# Patient Record
Sex: Female | Born: 1990 | Race: White | Hispanic: No | Marital: Single | Smoking: Never smoker
Health system: Southern US, Community
[De-identification: ages and names within clinical notes are randomized; demographics above are authoritative.]

## PROBLEM LIST (undated history)

## (undated) ENCOUNTER — Inpatient Hospital Stay (HOSPITAL_COMMUNITY): Payer: Self-pay

## (undated) DIAGNOSIS — F419 Anxiety disorder, unspecified: Secondary | ICD-10-CM

## (undated) DIAGNOSIS — R079 Chest pain, unspecified: Secondary | ICD-10-CM

## (undated) DIAGNOSIS — F32A Depression, unspecified: Secondary | ICD-10-CM

## (undated) DIAGNOSIS — R0600 Dyspnea, unspecified: Secondary | ICD-10-CM

## (undated) DIAGNOSIS — G43909 Migraine, unspecified, not intractable, without status migrainosus: Secondary | ICD-10-CM

## (undated) DIAGNOSIS — O139 Gestational [pregnancy-induced] hypertension without significant proteinuria, unspecified trimester: Secondary | ICD-10-CM

## (undated) DIAGNOSIS — F329 Major depressive disorder, single episode, unspecified: Secondary | ICD-10-CM

## (undated) DIAGNOSIS — T7840XA Allergy, unspecified, initial encounter: Secondary | ICD-10-CM

## (undated) DIAGNOSIS — K9041 Non-celiac gluten sensitivity: Secondary | ICD-10-CM

## (undated) DIAGNOSIS — J45909 Unspecified asthma, uncomplicated: Secondary | ICD-10-CM

## (undated) DIAGNOSIS — K589 Irritable bowel syndrome without diarrhea: Secondary | ICD-10-CM

## (undated) DIAGNOSIS — R064 Hyperventilation: Secondary | ICD-10-CM

## (undated) DIAGNOSIS — F410 Panic disorder [episodic paroxysmal anxiety] without agoraphobia: Secondary | ICD-10-CM

## (undated) HISTORY — DX: Hyperventilation: R06.4

## (undated) HISTORY — DX: Depression, unspecified: F32.A

## (undated) HISTORY — DX: Panic disorder (episodic paroxysmal anxiety): F41.0

## (undated) HISTORY — DX: Anxiety disorder, unspecified: F41.9

## (undated) HISTORY — DX: Dyspnea, unspecified: R06.00

## (undated) HISTORY — DX: Chest pain, unspecified: R07.9

## (undated) HISTORY — PX: WISDOM TOOTH EXTRACTION: SHX21

## (undated) HISTORY — PX: ADENOIDECTOMY: SUR15

## (undated) HISTORY — PX: TONSILLECTOMY: SUR1361

## (undated) HISTORY — DX: Allergy, unspecified, initial encounter: T78.40XA

## (undated) HISTORY — DX: Major depressive disorder, single episode, unspecified: F32.9

---
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
## 2009-10-06 ENCOUNTER — Ambulatory Visit: Payer: Self-pay | Admitting: Obstetrics & Gynecology

## 2009-10-06 ENCOUNTER — Encounter (INDEPENDENT_AMBULATORY_CARE_PROVIDER_SITE_OTHER): Payer: Self-pay | Admitting: *Deleted

## 2009-10-06 LAB — CONVERTED CEMR LAB: Chlamydia, DNA Probe: NEGATIVE

## 2009-11-01 ENCOUNTER — Emergency Department: Payer: Self-pay | Admitting: Emergency Medicine

## 2009-11-10 ENCOUNTER — Ambulatory Visit: Payer: Self-pay | Admitting: Obstetrics & Gynecology

## 2010-10-04 NOTE — Assessment & Plan Note (Signed)
[name], PETROVICH NO.:  [id number]   MEDICAL RECORD NO.:  [id number]          PATIENT TYPE:  POB   LOCATION:  [facility] at [facility]         FACILITY:  [facility]   PHYSICIAN:  Scheryl Darter, MD       DATE OF BIRTH:  1991-02-04   DATE OF SERVICE:  11/10/2009                                  CLINIC NOTE   HISTORY:  The patient returns today stating that she has had pain that  are originated in her back and radiated to her pelvis for about last 2-3  weeks.  The patient is a 20 year old gravida 0, last menstrual period on  Oct 16, 2009, uses NuvaRing.  She saw her primary care physician about  some right-sided back pain that radiates down to her right pelvic area.  She was seen on November 01, 2009 and she was sent for evaluation.  CT scan  was essentially negative.  Pelvic ultrasound showed some trace pelvic  free fluid, otherwise normal uterus and ovaries.  She was told that the  trace free fluid may have been from ovarian cyst rupture.  The patient  states that she has a history of back pain, scoliosis, and seen a  chiropractor before.   MEDICATIONS:  1. Diclofenac sodium 75 mg p.r.n.  2. Ibuprofen 800 mg p.o. p.r.n.  3. Hydrocodone/APAP 5/325 one p.o. p.r.n.  4. Metronidazole 500 mg p.o. b.i.d. for 7 days.  5. Doxycycline 100 mg p.o. b.i.d. for 7 days.   PHYSICAL EXAMINATION:  She has some tenderness along the right side of  her middle back and paraspinal region.  There is some point tenderness  there.  I deferred pelvic examination.  Her STD probe in May was  negative as was the repeat testing done in the emergency room on the  13th.  Urinalysis was negative.  White blood cell count was normal.   IMPRESSION:  Negative workup for pelvic pain.  I believer that her pain  is actually musculoskeletal in origin due to finding of tenderness in  the musculature on the right upper back.  She has seen a chiropractor  before.   PLAN:  I refer her to Dr. Patrici Ranks in [facility]  for chiropractic  evaluation.  She has medication for pain which she continue.  She can  return here for yearly exams.  She will continue to take NuvaRing.      Scheryl Darter, MD     JA/MEDQ  D:  11/10/2009  T:  11/10/2009  Job:  [id number]

## 2010-10-04 NOTE — Assessment & Plan Note (Signed)
[name], [name]               ACCOUNT NO.:  [id number]   MEDICAL RECORD NO.:  [id number]          PATIENT TYPE:  POB   LOCATION:  [facility] at [facility]         FACILITY:  [facility]   PHYSICIAN:  Scheryl Darter, MD       DATE OF BIRTH:  12/10/1990   DATE OF SERVICE:  10/06/2009                                  CLINIC NOTE   CHIEF COMPLAINT:  Time for physical exam and requesting STD test.   HISTORY:  The patient is a 20 year old gravida 0, para 0 female, last  menstrual period on September 17, 2009, presents for yearly physical exam.  The patient is using NuvaRing for birth control.  She wants to continue  this.  She requests STD testing.   PAST MEDICAL HISTORY:  ADHD, gastroesophageal reflux disease, TMJ,  irritable bowel syndrome, and panic attacks.   PAST SURGICAL HISTORY:  Extraction of wisdom teeth, tonsillectomy and  adenoidectomy.   MEDICATIONS:  1. Diclofenac sodium 75 mg p.r.n. pain.  2. The vitamin supplement, C3 vitamin supplement.  3. NuvaRing.  4. Bilberry.  5. Biotin.  6. Hydrocodone p.r.n.   ALLERGIES:  No latex allergy.  She has an allergy to SULFA.   FAMILY HISTORY:  Cervical dysplasia.   SOCIAL HISTORY:  The patient denies alcohol, tobacco, or drug use.  She  is single.   REVIEW OF SYSTEMS:  Normal.  No abnormal discharge.   PHYSICAL EXAMINATION:  VITAL SIGNS:  Weight is 139 pounds, height 5 feet  2 inches, BP 106/74, and pulse 80.  GENERAL:  The patient is in no acute distress.  General physical exam is  normal.  ABDOMEN:  Nontender.  PELVIC:  External genitalia, vagina and cervix normal.  Pap was done.  Uterus normal size.  No masses.  STD probe was obtained.   IMPRESSION:  1. Normal physical exam.  2. NuvaRing.   PLAN:  The patient will continue with NuvaRing, I gave her a  prescription.  She is instructed to return for yearly exams.  The result  of the STD probe will be reported.      Scheryl Darter, MD     JA/MEDQ  D:  11/10/2009  T:   11/10/2009  Job:  [id number]

## 2010-10-04 NOTE — Assessment & Plan Note (Signed)
[facility] OFFICE NOTE   QUYNH, BASSO                        MRN:          [id number]  DATE:01/09/2007                            DOB:          1990-07-03    Ms. Merrilyn Legler is a 20 year old lady with the following complaint:  Chest pain and shortness of breath.   HISTORY OF PRESENT ILLNESS:  Ms. Advika Mclelland is a 19 year old single  white female who comes with her mother today with a chief complaint as  above.  On July 31 while out with her boyfriend, she began to feel  really tired.  She subsequently got short of breath, got lightheaded,  felt nauseated, and felt tingling in her hands and her feet.   She went to [facility] Emergency room.  EKG there was normal.  Her  chest x-ray was also normal.  She had a negative pregnancy test,  urinalysis was negative, CBC showed a hemoglobin of 12.5, her  electrolytes were normal, her lipase was normal at 219.   She exercises on a regular basis and has no problems whatsoever with any  limitations.   She does not smoke, does not drink, except a few caffeinated beverages a  day.   PAST MEDICAL HISTORY:  She is on Prilosec over the counter.  She is on a  NuvaRing.   She has no known drug allergies.   SURGICAL HISTORY:  Had tonsillectomy and adenoidectomy in 1993.   FAMILY HISTORY:  Negative for premature coronary disease in 1st degree  relatives.   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  She is single and has no children.   REVIEW OF SYSTEMS:  Negative other than the history of present illness.  Does suffer some chronic constipation, some chronic fatigue, anxiety,  depression.   PHYSICAL EXAMINATION:  Her blood pressure is 108/68, her pulse is 68 and  regular.  Weight is 127.  She is 5 feet 5 inches tall.  HEENT:  Normocephalic, atraumatic, PERRLA, extraocular  movements  intact.  Sclerae clear.  She  wears glasses.  Dentition is satisfactory.  NECK:  Supple, carotid upstrokes were equal bilaterally without bruits,  no JVD, thyroid is not enlarged, trachea is midline.  LUNGS:  Clear.  HEART:  Reveals a regular rate and rhythm, no murmur, rub, or gallop or  click.  ABDOMINAL EXAM:  Soft, good bowel sounds, no midline bruit.  EXTREMITIES:  Reveal no cyanosis, clubbing, or edema.  Pulses are  intact.  NEURO EXAM:  Intact.   ASSESSMENT:  1. Hyperventilation syndrome.  2. Panic attacks.  3. History of anxiety/depression.  4. Chronic fatigue of unknown etiology.   RECOMMENDATIONS:  I have had a long talk with her and her mother.  I  have gone through the pathophysiology of hyperventilation syndrome and  how to treat it by slow, deep breathing, relaxation, and paper bag if  necessary.  I have asked her to exercise on a regular basis to decrease  her stress levels.  There is no further  cardiac workup indicated at this  time.  I will see her back on a p.r.n. basis.     Thomas C. Daleen Squibb, MD, [facility]  Electronically Signed    TCW/MedQ  DD: 01/09/2007  DT: 01/10/2007  Job #: [id number]   cc:   Julieanne Manson

## 2010-10-04 NOTE — Assessment & Plan Note (Signed)
NAMEELINOR, [name] NO.:  [id number]   MEDICAL RECORD NO.:  [id number]          PATIENT TYPE:  POB   LOCATION:  [facility] at [facility]         FACILITY:  [facility]   PHYSICIAN:  Allie Bossier, MD        DATE OF BIRTH:  10/26/1990   DATE OF SERVICE:                                  CLINIC NOTE   HISTORY:  [name] is an 20 year old single white gravida 0 who lives with  her mom and her mom's fiance.  She comes here for an annual exam.  She  wants to get established here in this practice where her mother is also  seen.  She actually is up-to-date on a Pap smear, which she had in May  of this year.  She said that she is being followed for HPV Pap smears  weekly that we have requested these results and they are not yet  available.  She needs a refill on her NuvaRing.   PAST MEDICAL HISTORY:  ADHD, GERD, TMJ, IBS, and panic attacks.  Please  note she has also been evaluated last year for an irregular heart beat  that included a normal echocardiogram.  She says this condition is  essentially resolved.   PAST SURGICAL HISTORY:  Wisdom teeth, tonsillectomy and adenoidectomy.   FAMILY HISTORY:  Negative for breast and colon cancer, but her mother  had a LEEP for cervical dysplasia.  [name] says cervical cancer, however,  as far as I am aware a LEEP is not a treatment for cervical cancer.   ALLERGIES:  No known latex allergies.  Drug allergy is SULFA.   SOCIAL HISTORY:  Negative for tobacco, alcohol, or drug use.   REVIEW OF SYSTEMS:  Her periods while on the NuvaRing are light and not  painful.  She works at [employer] and plans to attend [employer] soon.  She  lives with her mom and her mom's boyfriend.  As far as her sexual  activity goes, she has a friend with benefit situation and she says she  uses condoms.  She received the Gardasil vaccinations at approximately  20 years of age.   PHYSICAL EXAMINATION:  VITAL SIGNS:  Height 5 feet 2 inches, weight 131,  blood pressure  113/87, and pulse 95.  HEENT:  Normal.  HEART:  Regular rate and rhythm.  BREAST:  Normal bilaterally.  ABDOMEN:  Scaphoid.  No hepatosplenomegaly.  EXTERNAL GENITALIA:  No lesions.  Vagina, no lesions.  Cervix,  nulliparous, no lesions.  Uterus retroverted, small, minimally mobile,  nontender.  Adnexa; nontender; no masses.   ASSESSMENT AND PLAN:  Annual exam or/establishment of care.  We will  obtain her Pap smear results.  I have in depth explained.  I have given  her a long discussion about HPV and stress (please see her list of  medical  conditions).  She requests STD screening.  I have ordered GC Chlamydia  probe along with HSV-2 IgG (she understands the implications of this),  hep C, RPR, and HIV testing.      Allie Bossier, MD     MCD/MEDQ  D:  11/25/2008  T:  11/25/2008  Job:  [id number] 

## 2010-11-10 ENCOUNTER — Encounter: Payer: Self-pay | Admitting: Cardiovascular Disease

## 2010-11-22 ENCOUNTER — Ambulatory Visit: Payer: Self-pay | Admitting: Obstetrics & Gynecology

## 2010-12-01 ENCOUNTER — Other Ambulatory Visit: Payer: Self-pay | Admitting: Obstetrics & Gynecology

## 2010-12-01 ENCOUNTER — Ambulatory Visit: Payer: Self-pay | Admitting: Obstetrics & Gynecology

## 2010-12-01 ENCOUNTER — Ambulatory Visit (INDEPENDENT_AMBULATORY_CARE_PROVIDER_SITE_OTHER): Payer: Medicaid Other | Admitting: Obstetrics & Gynecology

## 2010-12-01 ENCOUNTER — Encounter: Payer: Self-pay | Admitting: Obstetrics & Gynecology

## 2010-12-01 VITALS — BP 116/78 | HR 73 | Ht 65.0 in | Wt 137.0 lb

## 2010-12-01 DIAGNOSIS — Z1239 Encounter for other screening for malignant neoplasm of breast: Secondary | ICD-10-CM

## 2010-12-01 DIAGNOSIS — Z30431 Encounter for routine checking of intrauterine contraceptive device: Secondary | ICD-10-CM

## 2010-12-01 DIAGNOSIS — Z01419 Encounter for gynecological examination (general) (routine) without abnormal findings: Secondary | ICD-10-CM

## 2010-12-01 DIAGNOSIS — Z113 Encounter for screening for infections with a predominantly sexual mode of transmission: Secondary | ICD-10-CM

## 2010-12-01 MED ORDER — ETONOGESTREL-ETHINYL ESTRADIOL 0.12-0.015 MG/24HR VA RING: 1.0000 | VAGINAL_RING | VAGINAL | Status: DC

## 2010-12-01 NOTE — Progress Notes (Signed)
  Subjective:     [name] is a 20 y.o. female G0 and is here for a comprehensive physical exam. The patient reports problems - Reports occasional pain with intercourse over the past two months..  History   Social History  . Marital Status: Married    Spouse Name: N/A    Number of Children: N/A  . Years of Education: N/A   Occupational History  . Part time    Social History Main Topics  . Smoking status: Current Some Day Smoker    Types: Cigarettes  . Smokeless tobacco: Not on file  . Alcohol Use: No  . Drug Use: No  . Sexually Active: Yes -- Female partner(s)   Other Topics Concern  . Not on file   Social History Narrative   Gets regular exercise   POB/GYN History: G0.  No conditions.  The following portions of the patient's history were reviewed and updated as appropriate: allergies, current medications, past family history, past medical history, past social history, past surgical history and problem list.  Review of Systems Pertinent items are noted in HPI.  Fourteen point comprehensive ROS reviewed.  Objective:    BP 116/78  Pulse 73  Ht 5\' 5"  (1.651 m)  Wt 137 lb (62.143 kg)  BMI 22.80 kg/m2  LMP 11/08/2010 General appearance: no distress Head: Normocephalic, without obvious abnormality, atraumatic Eyes: negative, conjunctivae/corneas clear. PERRL, EOM's intact. Fundi benign. Lungs: clear to auscultation bilaterally Breasts: normal appearance, no masses or tenderness, Inspection negative, No nipple retraction or dimpling, No nipple discharge or bleeding, No axillary or supraclavicular adenopathy, Normal to palpation without dominant masses Heart: regular rate and rhythm, S1, S2 normal, no murmur, click, rub or gallop Abdomen: soft, non-tender; bowel sounds normal; no masses,  no organomegaly Pelvic: cervix normal in appearance, external genitalia normal, no adnexal masses or tenderness, no cervical motion tenderness, rectovaginal septum normal, uterus normal  size, shape, and consistency and vagina normal without discharge Extremities: extremities normal, atraumatic, no cyanosis or edema    Assessment:    Healthy female exam. No exam anomalies, will continue to monitor complaint.       Plan:  No pap indicated, will perform next year. S/p Gardasil series STD testing done Nuvaring Rx refilled Told to call/return with any GYN concerns.   See After Visit Summary for Counseling Recommendations

## 2010-12-02 LAB — HEPATITIS B SURFACE ANTIBODY,QUALITATIVE: Hep B S Ab: POSITIVE — AB

## 2010-12-02 LAB — RPR

## 2010-12-05 LAB — WET PREP, GENITAL
Clue Cells Wet Prep HPF POC: NONE SEEN
Trich, Wet Prep: NONE SEEN

## 2011-06-19 ENCOUNTER — Other Ambulatory Visit (INDEPENDENT_AMBULATORY_CARE_PROVIDER_SITE_OTHER): Payer: Medicaid Other | Admitting: *Deleted

## 2011-06-19 ENCOUNTER — Other Ambulatory Visit: Payer: Medicaid Other

## 2011-06-19 DIAGNOSIS — N390 Urinary tract infection, site not specified: Secondary | ICD-10-CM

## 2011-06-19 LAB — POCT URINALYSIS DIPSTICK
Glucose, UA: NEGATIVE
Ketones, UA: NEGATIVE
Protein, UA: NEGATIVE
Spec Grav, UA: 1.015

## 2011-06-19 MED ORDER — CIPROFLOXACIN HCL 500 MG PO TABS: 500.0000 mg | ORAL_TABLET | Freq: Two times a day (BID) | ORAL | Status: DC

## 2011-06-19 NOTE — Progress Notes (Signed)
Patient complains of pain and frequency with urination gradually worsening over the past week.  Uristat gives temporary relief.  Urine dip is positive, we will send for culture and call in Cipro for treatment.

## 2011-06-21 LAB — URINE CULTURE: Colony Count: 6000

## 2011-06-27 ENCOUNTER — Encounter: Payer: Self-pay | Admitting: Family Medicine

## 2011-06-27 ENCOUNTER — Ambulatory Visit (INDEPENDENT_AMBULATORY_CARE_PROVIDER_SITE_OTHER): Payer: Medicaid Other | Admitting: Family Medicine

## 2011-06-27 VITALS — BP 124/77 | HR 77 | Ht 65.0 in | Wt 139.0 lb

## 2011-06-27 DIAGNOSIS — Z7251 High risk heterosexual behavior: Secondary | ICD-10-CM

## 2011-06-27 LAB — RPR

## 2011-06-27 NOTE — Progress Notes (Signed)
History Found out her boyfriend was cheating on her.  Wants full STD screen.  Has some vaginal discharge with odor.  Denies fever, N/V.  Physical exam Filed Vitals:   06/27/11 1639  BP: 124/77  Pulse: 77  Height: 5\' 5"  (1.651 m)  Weight: 139 lb (63.05 kg)  Physical Exam  Genitourinary: Vagina normal and uterus normal. Cervix exhibits discharge. Cervix exhibits no motion tenderness.       Vaginal discharge    Assessment/Plan 1. Problems related to high-risk sexual behavior  GC/chlamydia probe amp, genital, HIV antibody, RPR, Hepatitis C antibody, Hepatitis B surface antigen, Wet prep, genital, HSV(herpes smplx)abs-1+2(IgG+IgM)-bld

## 2011-06-27 NOTE — Patient Instructions (Signed)
Sexually Transmitted Disease Sexually transmitted disease (STD) refers to any infection that is passed from person to person during sexual activity. This may happen by way of saliva, semen, blood, vaginal mucus, or urine. Common STDs include:  Gonorrhea.   Chlamydia.   Syphilis.   HIV/AIDS.   Genital herpes.   Hepatitis B and C.   Trichomonas.   Human papillomavirus (HPV).   Pubic lice.  CAUSES  An STD may be spread by bacteria, virus, or parasite. A person can get an STD by:  Sexual intercourse with an infected person.   Sharing sex toys with an infected person.   Sharing needles with an infected person.   Having intimate contact with the genitals, mouth, or rectal areas of an infected person.  SYMPTOMS  Some people may not have any symptoms, but they can still pass the infection to others. Different STDs have different symptoms. Symptoms include:  Painful or bloody urination.   Pain in the pelvis, abdomen, vagina, anus, throat, or eyes.   Skin rash, itching, irritation, growths, or sores (lesions). These usually occur in the genital or anal area.   Abnormal vaginal discharge.   Penile discharge in men.   Soft, flesh-colored skin growths in the genital or anal area.   Fever.   Pain or bleeding during sexual intercourse.   Swollen glands in the groin area.   Yellow skin and eyes (jaundice). This is seen with hepatitis.  DIAGNOSIS  To make a diagnosis, your caregiver may:  Take a medical history.   Perform a physical exam.   Take a specimen (culture) to be examined.   Examine a sample of discharge under a microscope.   Perform blood tests.   Perform a Pap test, if this applies.   Perform a colposcopy.   Perform a laparoscopy.  TREATMENT   Chlamydia, gonorrhea, trichomonas, and syphilis can be cured with antibiotic medicine.   Genital herpes, hepatitis, and HIV can be treated, but not cured, with prescribed medicines. The medicines will lessen  the symptoms.   Genital warts from HPV can be treated with medicine or by freezing, burning (electrocautery), or surgery. Warts may come back.   HPV is a virus and cannot be cured with medicine or surgery.However, abnormal areas may be followed very closely by your caregiver and may be removed from the cervix, vagina, or vulva through office procedures or surgery.  If your diagnosis is confirmed, your recent sexual partners need treatment. This is true even if they are symptom-free or have a negative culture or evaluation. They should not have sex until their caregiver says it is okay. HOME CARE INSTRUCTIONS  All sexual partners should be informed, tested, and treated for all STDs.   Take your antibiotics as directed. Finish them even if you start to feel better.   Only take over-the-counter or prescription medicines for pain, discomfort, or fever as directed by your caregiver.   Rest.   Eat a balanced diet and drink enough fluids to keep your urine clear or pale yellow.   Do not have sex until treatment is completed and you have followed up with your caregiver. STDs should be checked after treatment.   Keep all follow-up appointments, Pap tests, and blood tests as directed by your caregiver.   Only use latex condoms and water-soluble lubricants during sexual activity. Do not use petroleum jelly or oils.   Avoid alcohol and illegal drugs.   Get vaccinated for HPV and hepatitis. If you have not received these vaccines   in the past, talk to your caregiver about whether one or both might be right for you.   Avoid risky sex practices that can break the skin.  The only way to avoid getting an STD is to avoid all sexual activity.Latex condoms and dental dams (for oral sex) will help lessen the risk of getting an STD, but will not completely eliminate the risk. SEEK MEDICAL CARE IF:   You have a fever.   You have any new or worsening symptoms.  Document Released: 07/29/2002 Document  Revised: 01/18/2011 Document Reviewed: 08/05/2010 Exit[employer] Patient Information 2012 [employer]. 

## 2011-06-28 LAB — HSV(HERPES SMPLX)ABS-I+II(IGG+IGM)-BLD
HSV 1 Glycoprotein G Ab, IgG: 8.79 IV — ABNORMAL HIGH
Herpes Simplex Vrs I&II-IgM Ab (EIA): 0.53 INDEX

## 2011-06-28 LAB — WET PREP, GENITAL
Clue Cells Wet Prep HPF POC: NONE SEEN
Yeast Wet Prep HPF POC: NONE SEEN

## 2011-06-28 LAB — HEPATITIS C ANTIBODY: HCV Ab: NEGATIVE

## 2011-06-29 ENCOUNTER — Telehealth: Payer: Self-pay | Admitting: *Deleted

## 2011-06-29 DIAGNOSIS — B009 Herpesviral infection, unspecified: Secondary | ICD-10-CM

## 2011-06-29 MED ORDER — VALACYCLOVIR HCL 500 MG PO TABS: 500.0000 mg | ORAL_TABLET | Freq: Every day | ORAL | Status: AC

## 2011-06-29 NOTE — Telephone Encounter (Signed)
After doing some research patient wishes to go on preventative medication for her positive hsv labs.  She will check on prices at her pharmacy and let me know which medication she would like called in.

## 2011-06-29 NOTE — Telephone Encounter (Signed)
Patient wishes to take the Valtrex 500mg  daily for preventative and to help prevent spread of the virus to her partner.  She has done her own research and feels like this is the best option for her to be safe.

## 2011-12-07 ENCOUNTER — Encounter: Payer: Self-pay | Admitting: Obstetrics & Gynecology

## 2011-12-07 ENCOUNTER — Ambulatory Visit (INDEPENDENT_AMBULATORY_CARE_PROVIDER_SITE_OTHER): Payer: Medicaid Other | Admitting: Obstetrics & Gynecology

## 2011-12-07 ENCOUNTER — Other Ambulatory Visit (HOSPITAL_COMMUNITY)
Admission: RE | Admit: 2011-12-07 | Discharge: 2011-12-07 | Disposition: A | Discharge status: Home / Self Care | Payer: Medicaid Other | Source: Ambulatory Visit | Attending: Obstetrics & Gynecology | Admitting: Obstetrics & Gynecology

## 2011-12-07 VITALS — BP 130/98 | HR 113 | Ht 65.0 in | Wt 142.0 lb

## 2011-12-07 DIAGNOSIS — Z01419 Encounter for gynecological examination (general) (routine) without abnormal findings: Secondary | ICD-10-CM | POA: Insufficient documentation

## 2011-12-07 DIAGNOSIS — Z30431 Encounter for routine checking of intrauterine contraceptive device: Secondary | ICD-10-CM

## 2011-12-07 DIAGNOSIS — Z113 Encounter for screening for infections with a predominantly sexual mode of transmission: Secondary | ICD-10-CM | POA: Insufficient documentation

## 2011-12-07 DIAGNOSIS — Z0000 Encounter for general adult medical examination without abnormal findings: Secondary | ICD-10-CM

## 2011-12-07 MED ORDER — ETONOGESTREL-ETHINYL ESTRADIOL 0.12-0.015 MG/24HR VA RING: VAGINAL_RING | VAGINAL | Status: DC

## 2011-12-07 NOTE — Addendum Note (Signed)
Addended by: Pennie Banter on: 12/07/2011 11:34 AM   Modules accepted: Orders

## 2011-12-07 NOTE — Progress Notes (Signed)
Subjective:    [name] is a 21 y.o. female who presents for an annual exam. The patient has no complaints today. She was treated last month at her [facility] office for chlamydia. Her partner of the last month was also treated. She would like testing for STIs today. The patient is sexually active. GYN screening history: last pap: was normal. The patient wears seatbelts: yes. The patient participates in regular exercise: yes. Has the patient ever been transfused or tattooed?: yes. (tattoos)The patient reports that there is not domestic violence in her life.   Menstrual History: OB History    Grav Para Term Preterm Abortions TAB SAB Ect Mult Living                  Menarche age: 80 Patient's last menstrual period was 11/09/2011.    The following portions of the patient's history were reviewed and updated as appropriate: allergies, current medications, past family history, past medical history, past social history, past surgical history and problem list.  Review of Systems A comprehensive review of systems was negative. 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 She has completed the Gardasil series.   Objective:    BP 130/98  Pulse 113  Ht 5\' 5"  (1.651 m)  Wt 142 lb (64.411 kg)  BMI 23.63 kg/m2  LMP 11/09/2011  General Appearance:    Alert, cooperative, no distress, appears stated age  Head:    Normocephalic, without obvious abnormality, atraumatic  Eyes:    PERRL, conjunctiva/corneas clear, EOM's intact, fundi    benign, both eyes  Ears:    Normal TM's and external ear canals, both ears  Nose:   Nares normal, septum midline, mucosa normal, no drainage    or sinus tenderness  Throat:   Lips, mucosa, and tongue normal; teeth and gums normal  Neck:   Supple, symmetrical, trachea midline, no adenopathy;    thyroid:  no enlargement/tenderness/nodules; no carotid   bruit or JVD  Back:     Symmetric, no curvature, ROM normal, no CVA tenderness  Lungs:     Clear to auscultation  bilaterally, respirations unlabored  Chest Wall:    No tenderness or deformity   Heart:    Regular rate and rhythm, S1 and S2 normal, no murmur, rub   or gallop  Breast Exam:    No tenderness, masses, or nipple abnormality  Abdomen:     Soft, non-tender, bowel sounds active all four quadrants,    no masses, no organomegaly  Genitalia:    Normal female without lesion, discharge or tenderness, shaved, no lesions, NSSA, NT, no adnexal masses     Extremities:   Extremities normal, atraumatic, no cyanosis or edema  Pulses:   2+ and symmetric all extremities  Skin:   Skin color, texture, turgor normal, no rashes or lesions  Lymph nodes:   Cervical, supraclavicular, and axillary nodes normal  Neurologic:   CNII-XII intact, normal strength, sensation and reflexes    throughout  .    Assessment:    Healthy female exam.    Plan:     Pap smear.  STI testing I have recommended that she stop using a tanning bed. BP check in 2 weeks. She is aware that her birth control method may need to be changed if her BP is still elevated.

## 2011-12-07 NOTE — Progress Notes (Signed)
Yearly exam, doing well, would like to be tested for all sti.  She has been feeling sick lately, her blood pressure is up today and she usually does not have problems, she is also been nauseated and having headaches.  She is currently using the [employer] for birth control.

## 2011-12-08 LAB — HIV ANTIBODY (ROUTINE TESTING W REFLEX): HIV: NONREACTIVE

## 2011-12-08 LAB — HEPATITIS B SURFACE ANTIGEN: Hepatitis B Surface Ag: NEGATIVE

## 2011-12-08 LAB — HEPATITIS C ANTIBODY: HCV Ab: NEGATIVE

## 2011-12-25 ENCOUNTER — Other Ambulatory Visit: Payer: Medicaid Other

## 2012-01-03 ENCOUNTER — Encounter (HOSPITAL_COMMUNITY): Payer: Self-pay | Admitting: Emergency Medicine

## 2012-01-03 DIAGNOSIS — F17200 Nicotine dependence, unspecified, uncomplicated: Secondary | ICD-10-CM | POA: Insufficient documentation

## 2012-01-03 DIAGNOSIS — R42 Dizziness and giddiness: Secondary | ICD-10-CM | POA: Insufficient documentation

## 2012-01-03 DIAGNOSIS — R51 Headache: Secondary | ICD-10-CM | POA: Insufficient documentation

## 2012-01-03 LAB — CBC WITH DIFFERENTIAL/PLATELET
Basophils Absolute: 0 10*3/uL (ref 0.0–0.1)
Basophils Relative: 0 % (ref 0–1)
Eosinophils Absolute: 0.1 10*3/uL (ref 0.0–0.7)
Eosinophils Relative: 2 % (ref 0–5)
Lymphocytes Relative: 41 % (ref 12–46)
MCHC: 34.7 g/dL (ref 30.0–36.0)
Monocytes Absolute: 0.5 10*3/uL (ref 0.1–1.0)
Neutro Abs: 3.7 10*3/uL (ref 1.7–7.7)
RBC: 4.46 MIL/uL (ref 3.87–5.11)
RDW: 12.5 % (ref 11.5–15.5)

## 2012-01-03 LAB — BASIC METABOLIC PANEL
Chloride: 103 mEq/L (ref 96–112)
GFR calc non Af Amer: >90 mL/min (ref >90)

## 2012-01-03 NOTE — ED Notes (Signed)
PT. REPORTS DIZZINESS WITH HEADACHE AND BLURRED VISION / NUMBNESS AT LIPS THIS EVENING .

## 2012-01-04 ENCOUNTER — Emergency Department (HOSPITAL_COMMUNITY)
Admission: EM | Admit: 2012-01-04 | Discharge: 2012-01-04 | Disposition: A | Discharge status: Home / Self Care | Payer: Medicaid Other | Attending: Emergency Medicine | Admitting: Emergency Medicine

## 2012-01-04 DIAGNOSIS — R51 Headache: Secondary | ICD-10-CM

## 2012-01-04 HISTORY — DX: Anxiety disorder, unspecified: F41.9

## 2012-01-04 LAB — URINALYSIS, ROUTINE W REFLEX MICROSCOPIC
Glucose, UA: NEGATIVE mg/dL
Hgb urine dipstick: NEGATIVE
Protein, ur: NEGATIVE mg/dL
Specific Gravity, Urine: 1.017 (ref 1.005–1.030)
Urobilinogen, UA: 0.2 mg/dL (ref 0.0–1.0)
pH: 5.5 (ref 5.0–8.0)

## 2012-01-04 LAB — URINE MICROSCOPIC-ADD ON

## 2012-01-04 MED ORDER — HYDROCODONE-ACETAMINOPHEN 5-325 MG PO TABS
1.0000 | ORAL_TABLET | Freq: Once | ORAL | Status: AC
  Administered 2012-01-04: 1 via ORAL
  Filled 2012-01-04: qty 1

## 2012-01-04 MED ORDER — MECLIZINE HCL 25 MG PO TABS
25.0000 mg | ORAL_TABLET | Freq: Once | ORAL | Status: AC
  Administered 2012-01-04: 25 mg via ORAL
  Filled 2012-01-04: qty 1

## 2012-01-04 NOTE — ED Notes (Signed)
Pt d/c home. No Rx given. Instructed on causes of headaches and how to manage the symptoms. A.O. X 4. Ambulatory. Family at bedside.

## 2012-01-04 NOTE — ED Provider Notes (Addendum)
History     CSN: [id number]  Arrival date & time 01/03/12  2137   First MD Initiated Contact with Patient 01/04/12 0145      Chief Complaint  Patient presents with  . Dizziness    (Consider location/radiation/quality/duration/timing/severity/associated sxs/prior treatment) Patient is a 21 y.o. female presenting with headaches.  Headache  This is a recurrent problem. The current episode started yesterday. The problem occurs constantly. The problem has been gradually improving. Associated with: dizziness. The pain is at a severity of 3/10. The pain is mild. The pain does not radiate.    Past Medical History  Diagnosis Date  . Chest pain, unspecified   . Dyspnea   . Hyperventilation   . Panic attack   . Anxiety and depression   . Malaise and fatigue   . Allergy   . Panic attack   . Anxiety     Past Surgical History  Procedure Date  . Tonsillectomy   . Adenoidectomy     Family History  Problem Relation Age of Onset  . Hypertension Mother   . Cancer Mother   . Heart disease Maternal Grandfather     History  Substance Use Topics  . Smoking status: Current Some Day Smoker    Types: Cigarettes  . Smokeless tobacco: Never Used  . Alcohol Use: No    OB History    Grav Para Term Preterm Abortions TAB SAB Ect Mult Living                  Review of Systems  Neurological: Positive for dizziness and headaches.  All other systems reviewed and are negative.    Allergies  Sulfa antibiotics and Septra  Home Medications   Current Outpatient Rx  Name Route Sig Dispense Refill  . ETONOGESTREL-ETHINYL ESTRADIOL 0.12-0.015 MG/24HR VA RING  Change ring every 3 weeks 1 each 11    BP 116/81  Pulse 75  Temp 98.3 F (36.8 C) (Oral)  Resp 16  SpO2 100%  LMP 12/19/2011  Physical Exam  Constitutional: She is oriented to person, place, and time. She appears well-developed and well-nourished.  HENT:  Head: Normocephalic and atraumatic.  Eyes: Conjunctivae and EOM  are normal. Pupils are equal, round, and reactive to light.  Neck: Normal range of motion.  Cardiovascular: Normal rate, regular rhythm and normal heart sounds.   Pulmonary/Chest: Effort normal and breath sounds normal.  Abdominal: Soft. Bowel sounds are normal.  Musculoskeletal: Normal range of motion.  Neurological: She is alert and oriented to person, place, and time.  Skin: Skin is warm and dry.  Psychiatric: She has a normal mood and affect. Her behavior is normal.    ED Course  Procedures (including critical care time)  Labs Reviewed  URINALYSIS, ROUTINE W REFLEX MICROSCOPIC - Abnormal; Notable for the following:    APPearance CLOUDY (*)     Leukocytes, UA SMALL (*)     All other components within normal limits  URINE MICROSCOPIC-ADD ON - Abnormal; Notable for the following:    Squamous Epithelial / LPF MANY (*)     Bacteria, UA MANY (*)     All other components within normal limits  BASIC METABOLIC PANEL  CBC WITH DIFFERENTIAL  POCT PREGNANCY, URINE   No results found.   No diagnosis found.    MDM  + ha,  Vertigo. Improved.  States different than previous ha because of dizziness.   No focal deficits,  Will reassess   Improved.  Will dc to fu  outpt, ret new/worsneing sxs.  NO meningismus, no fever.  HX of anxiety.  Pt states might have contributed.  Labs benign.  Outpt fu.     Cloteal Isaacson Lytle Michaels, MD 01/04/12 1610  Rosanne Ashing, MD 01/04/12 [phone]

## 2012-01-04 NOTE — ED Notes (Addendum)
Pt report feeling dizzy since approx 2000 last night. Reports headaches,light sensitivity,  blurred vision, and nausea x 2 days. Denies changes in bowel or bladder. Denies abdominal pain. Denies chest pain. Denies SOB.  Respirations even and regular. Skin warm, dry, intact. A.O. X 4. NAD.

## 2012-01-04 NOTE — ED Notes (Signed)
Pt notified of need for urine sample. 

## 2012-09-26 ENCOUNTER — Emergency Department: Payer: Self-pay | Admitting: Emergency Medicine

## 2012-09-26 LAB — COMPREHENSIVE METABOLIC PANEL
Alkaline Phosphatase: 75 U/L (ref 50–136)
Anion Gap: 6 — ABNORMAL LOW (ref 7–16)
Chloride: 107 mmol/L (ref 98–107)
Creatinine: 0.54 mg/dL — ABNORMAL LOW (ref 0.60–1.30)
EGFR (African American): >60
EGFR (Non-African Amer.): >60
Osmolality: 276 (ref 275–301)
Potassium: 4.3 mmol/L (ref 3.5–5.1)
SGOT(AST): 20 U/L (ref 15–37)
SGPT (ALT): 16 U/L (ref 12–78)

## 2012-09-26 LAB — HCG, QUANTITATIVE, PREGNANCY: Beta Hcg, Quant.: <1 m[IU]/mL — ABNORMAL LOW

## 2012-09-26 LAB — URINALYSIS, COMPLETE
Bilirubin,UR: NEGATIVE
Blood: NEGATIVE
Nitrite: NEGATIVE
Ph: 5 (ref 4.5–8.0)
Protein: 30
RBC,UR: 6 /HPF (ref 0–5)
Squamous Epithelial: 42
WBC UR: 9 /HPF (ref 0–5)

## 2012-09-26 LAB — CBC
HGB: 14 g/dL (ref 12.0–16.0)
MCH: 31.5 pg (ref 26.0–34.0)
MCHC: 35.8 g/dL (ref 32.0–36.0)
MCV: 88 fL (ref 80–100)
RBC: 4.45 10*6/uL (ref 3.80–5.20)
RDW: 12.4 % (ref 11.5–14.5)

## 2012-09-26 LAB — WET PREP, GENITAL

## 2013-01-17 ENCOUNTER — Emergency Department: Payer: Self-pay | Admitting: Emergency Medicine

## 2013-01-20 LAB — BETA STREP CULTURE(ARMC)

## 2013-06-11 ENCOUNTER — Emergency Department: Payer: Self-pay | Admitting: Emergency Medicine

## 2013-11-13 ENCOUNTER — Emergency Department: Payer: Self-pay | Admitting: Emergency Medicine

## 2013-11-13 LAB — CBC
HCT: 41 % (ref 35.0–47.0)
HGB: 13.9 g/dL (ref 12.0–16.0)
MCH: 31 pg (ref 26.0–34.0)
MCHC: 34 g/dL (ref 32.0–36.0)
MCV: 91 fL (ref 80–100)
Platelet: 172 10*3/uL (ref 150–440)
RBC: 4.5 10*6/uL (ref 3.80–5.20)
RDW: 12.9 % (ref 11.5–14.5)
WBC: 7.9 10*3/uL (ref 3.6–11.0)

## 2013-11-13 LAB — URINALYSIS, COMPLETE
BACTERIA: NONE SEEN
BLOOD: NEGATIVE
Bilirubin,UR: NEGATIVE
GLUCOSE, UR: NEGATIVE mg/dL (ref 0–75)
Ketone: NEGATIVE
Leukocyte Esterase: NEGATIVE
Nitrite: NEGATIVE
PH: 6 (ref 4.5–8.0)
Protein: NEGATIVE
RBC,UR: <1 /HPF (ref 0–5)
Specific Gravity: 1.014 (ref 1.003–1.030)
Squamous Epithelial: 2
WBC UR: 1 /HPF (ref 0–5)

## 2013-11-13 LAB — COMPREHENSIVE METABOLIC PANEL
ALK PHOS: 73 U/L
ANION GAP: 6 — AB (ref 7–16)
Albumin: 3.5 g/dL (ref 3.4–5.0)
BILIRUBIN TOTAL: 0.7 mg/dL (ref 0.2–1.0)
BUN: 9 mg/dL (ref 7–18)
CHLORIDE: 108 mmol/L — AB (ref 98–107)
CO2: 25 mmol/L (ref 21–32)
CREATININE: 0.44 mg/dL — AB (ref 0.60–1.30)
Calcium, Total: 9 mg/dL (ref 8.5–10.1)
EGFR (African American): >60
EGFR (Non-African Amer.): >60
GLUCOSE: 83 mg/dL (ref 65–99)
Osmolality: 275 (ref 275–301)
Potassium: 3.8 mmol/L (ref 3.5–5.1)
SGOT(AST): 10 U/L — ABNORMAL LOW (ref 15–37)
SGPT (ALT): 13 U/L (ref 12–78)
SODIUM: 139 mmol/L (ref 136–145)
Total Protein: 6.5 g/dL (ref 6.4–8.2)

## 2013-11-13 LAB — HCG, QUANTITATIVE, PREGNANCY: Beta Hcg, Quant.: <1 m[IU]/mL — ABNORMAL LOW

## 2013-11-13 LAB — BASIC METABOLIC PANEL
Anion Gap: 5 — ABNORMAL LOW (ref 7–16)
BUN: 11 mg/dL (ref 7–18)
CHLORIDE: 108 mmol/L — AB (ref 98–107)
CREATININE: 0.47 mg/dL — AB (ref 0.60–1.30)
Calcium, Total: 9.3 mg/dL (ref 8.5–10.1)
Co2: 25 mmol/L (ref 21–32)
EGFR (African American): >60
Glucose: 94 mg/dL (ref 65–99)
OSMOLALITY: 275 (ref 275–301)
Potassium: 3.9 mmol/L (ref 3.5–5.1)
Sodium: 138 mmol/L (ref 136–145)

## 2013-11-13 LAB — GC/CHLAMYDIA PROBE AMP

## 2013-11-13 LAB — LIPASE, BLOOD: Lipase: 99 U/L (ref 73–393)

## 2013-11-13 LAB — WET PREP, GENITAL

## 2013-12-30 ENCOUNTER — Inpatient Hospital Stay (HOSPITAL_COMMUNITY)
Admission: AD | Admit: 2013-12-30 | Discharge: 2013-12-30 | Disposition: A | Discharge status: Home / Self Care | Payer: Medicaid Other | Source: Ambulatory Visit | Attending: Obstetrics & Gynecology | Admitting: Obstetrics & Gynecology

## 2013-12-30 ENCOUNTER — Inpatient Hospital Stay (HOSPITAL_COMMUNITY): Payer: Medicaid Other

## 2013-12-30 ENCOUNTER — Encounter (HOSPITAL_COMMUNITY): Payer: Self-pay | Admitting: Advanced Practice Midwife

## 2013-12-30 DIAGNOSIS — R109 Unspecified abdominal pain: Secondary | ICD-10-CM | POA: Diagnosis not present

## 2013-12-30 DIAGNOSIS — K589 Irritable bowel syndrome without diarrhea: Secondary | ICD-10-CM | POA: Insufficient documentation

## 2013-12-30 DIAGNOSIS — O26899 Other specified pregnancy related conditions, unspecified trimester: Secondary | ICD-10-CM

## 2013-12-30 DIAGNOSIS — K900 Celiac disease: Secondary | ICD-10-CM | POA: Diagnosis not present

## 2013-12-30 DIAGNOSIS — R51 Headache: Secondary | ICD-10-CM | POA: Insufficient documentation

## 2013-12-30 DIAGNOSIS — O210 Mild hyperemesis gravidarum: Secondary | ICD-10-CM | POA: Diagnosis present

## 2013-12-30 HISTORY — DX: Non-celiac gluten sensitivity: K90.41

## 2013-12-30 HISTORY — DX: Irritable bowel syndrome, unspecified: K58.9

## 2013-12-30 LAB — HCG, QUANTITATIVE, PREGNANCY: HCG, BETA CHAIN, QUANT, S: 1176 m[IU]/mL — AB (ref <5)

## 2013-12-30 LAB — URINALYSIS, ROUTINE W REFLEX MICROSCOPIC
BILIRUBIN URINE: NEGATIVE
GLUCOSE, UA: NEGATIVE mg/dL
Hgb urine dipstick: NEGATIVE
KETONES UR: NEGATIVE mg/dL
Leukocytes, UA: NEGATIVE
Nitrite: NEGATIVE
PH: 7.5 (ref 5.0–8.0)
PROTEIN: NEGATIVE mg/dL
Specific Gravity, Urine: 1.01 (ref 1.005–1.030)
Urobilinogen, UA: 0.2 mg/dL (ref 0.0–1.0)

## 2013-12-30 LAB — CBC
HEMATOCRIT: 35.3 % — AB (ref 36.0–46.0)
HEMOGLOBIN: 12.5 g/dL (ref 12.0–15.0)
MCH: 31.6 pg (ref 26.0–34.0)
MCHC: 35.4 g/dL (ref 30.0–36.0)
MCV: 89.1 fL (ref 78.0–100.0)
Platelets: 164 10*3/uL (ref 150–400)
RBC: 3.96 MIL/uL (ref 3.87–5.11)
RDW: 12.3 % (ref 11.5–15.5)
WBC: 7.2 10*3/uL (ref 4.0–10.5)

## 2013-12-30 LAB — WET PREP, GENITAL
Clue Cells Wet Prep HPF POC: NONE SEEN
Trich, Wet Prep: NONE SEEN
Yeast Wet Prep HPF POC: NONE SEEN

## 2013-12-30 LAB — POCT PREGNANCY, URINE: Preg Test, Ur: POSITIVE — AB

## 2013-12-30 MED ORDER — MECLIZINE HCL 25 MG PO TABS: ORAL_TABLET | ORAL | Status: DC

## 2013-12-30 MED ORDER — MECLIZINE HCL 25 MG PO TABS
25.0000 mg | ORAL_TABLET | Freq: Once | ORAL | Status: AC
  Administered 2013-12-30: 25 mg via ORAL
  Filled 2013-12-30: qty 1

## 2013-12-30 NOTE — MAU Note (Signed)
N&V for the last 3-4 days, no diarrhea.  Hx of frequent HA's, states they are now more intense & painful.  LMP 7/8, hasn't done HPT.  Denies abd pain or bleeding.

## 2013-12-30 NOTE — MAU Provider Note (Signed)
History     CSN: [id number]  Arrival date and time: 12/30/13 8295   First Provider Initiated Contact with Patient 12/30/13 1845      Chief Complaint  Patient presents with  . Nausea  . Headache   HPI  Pt is G1P0 at [redacted]w[redacted]d pregnant with  Nausea and headache for about 3 days.  Pt has taken ibuprofen for headaches. Pt denies UTI sx.  Pt has hx of IBS and gluten intolerance.  Pt has constipation IBS- pt has been throwing up the past few days.  Fri night and Sat night and Sunday morning.  Pt has had left lower quadrant tenderness on and off for the last few days. Pt denies fever or chills.   Past Medical History  Diagnosis Date  . Chest pain, unspecified   . Dyspnea   . Hyperventilation   . Panic attack   . Anxiety and depression   . Malaise and fatigue   . Allergy   . Panic attack   . Anxiety   . Gluten intolerance   . IBS (irritable bowel syndrome)     Past Surgical History  Procedure Laterality Date  . Tonsillectomy    . Adenoidectomy    . Wisdom tooth extraction      Family History  Problem Relation Age of Onset  . Hypertension Mother   . Cancer Mother   . Heart disease Maternal Grandfather     History  Substance Use Topics  . Smoking status: Never Smoker   . Smokeless tobacco: Never Used  . Alcohol Use: No    Allergies:  Allergies  Allergen Reactions  . Sulfa Antibiotics   . Septra [Sulfamethoxazole-Trimethoprim] Rash    No prescriptions prior to admission    Review of Systems  Constitutional: Negative for fever and chills.  HENT:       Resolved at this time  Gastrointestinal: Positive for nausea, vomiting and abdominal pain.  Genitourinary: Negative for dysuria and urgency.  Neurological: Positive for headaches.   Physical Exam   Blood pressure 115/76, pulse 81, temperature 98 F (36.7 C), temperature source Oral, resp. rate 18, height 5\' 5"  (1.651 m), weight 146 lb 3.2 oz (66.316 kg), last menstrual period 11/26/2013.  Physical Exam   Nursing note and vitals reviewed. Constitutional: She is oriented to person, place, and time. She appears well-developed and well-nourished. No distress.  HENT:  Head: Normocephalic.  Eyes: Pupils are equal, round, and reactive to light.  Neck: Normal range of motion. Neck supple.  Cardiovascular: Normal rate.   Respiratory: Effort normal.  GI: Soft. She exhibits no distension. There is tenderness. There is no rebound.  Genitourinary:  Vaginal clean, cervix clean NT; uterus NSSC NT; adnexa without palpable enlargement or tenderness  Musculoskeletal: Normal range of motion.  Neurological: She is alert and oriented to person, place, and time.  Skin: Skin is warm and dry.  Psychiatric: She has a normal mood and affect.    MAU Course  Procedures Results for orders placed during the hospital encounter of 12/30/13 (from the past 48 hour(s))  POCT PREGNANCY, URINE     Status: Abnormal   Collection Time    12/30/13  5:08 PM      Result Value Ref Range   Preg Test, Ur POSITIVE (*) NEGATIVE   Comment:            THE SENSITIVITY OF THIS     METHODOLOGY IS >24 mIU/mL  CBC     Status: Abnormal   Collection  Time    12/30/13  7:03 PM      Result Value Ref Range   WBC 7.2  4.0 - 10.5 K/uL   RBC 3.96  3.87 - 5.11 MIL/uL   Hemoglobin 12.5  12.0 - 15.0 g/dL   HCT 16.135.3 (*) 09.636.0 - 04.546.0 %   MCV 89.1  78.0 - 100.0 fL   MCH 31.6  26.0 - 34.0 pg   MCHC 35.4  30.0 - 36.0 g/dL   RDW 40.912.3  81.111.5 - 91.415.5 %   Platelets 164  150 - 400 K/uL  hcg Wet prep GC/chlamydia Urinalysis US Antivert 25mg  PO  [place] Ob Comp Less 14 Wks  12/30/2013   CLINICAL DATA:  Pelvic cramping.  EXAM: OBSTETRIC <14 WK US AND TRANSVAGINAL OB US  TECHNIQUE: Both transabdominal and transvaginal ultrasound examinations were performed for complete evaluation of the gestation as well as the maternal uterus, adnexal regions, and pelvic cul-de-sac. Transvaginal technique was performed to assess early pregnancy.  COMPARISON:  No priors.   FINDINGS: Intrauterine gestational sac: Small anechoic structure with increased through transmission in the fundal portion of the endometrial canal, potentially a small gestational sac.  Yolk sac:  None.  Embryo:  None.  Cardiac Activity: None.  Heart Rate:  N/A  MSD:  3.2  mm   4 w   6  d     US EDC: 09/02/2014  Maternal uterus/adnexae: If the structure discussed above is a gestational sac there is a tiny hypoechoic crescent adjacent to it, which could represent a trace amount of subchorionic hemorrhage. No significant free fluid in the cul-de-sac. The ovaries are normal in echotexture in appearance bilaterally.  IMPRESSION: 1. Possible early IUP, as above, with estimated gestational age of [redacted] weeks and 6 days. 2. If this is in fact an early IUP, there appears to be a tiny subchorionic hemorrhage around the gestational sac.   Electronically Signed   By: Trudie Reedaniel  Entrikin M.D.   On: 12/30/2013 20:04   [place] Ob Transvaginal  12/30/2013   CLINICAL DATA:  Pelvic cramping.  EXAM: OBSTETRIC <14 WK US AND TRANSVAGINAL OB US  TECHNIQUE: Both transabdominal and transvaginal ultrasound examinations were performed for complete evaluation of the gestation as well as the maternal uterus, adnexal regions, and pelvic cul-de-sac. Transvaginal technique was performed to assess early pregnancy.  COMPARISON:  No priors.  FINDINGS: Intrauterine gestational sac: Small anechoic structure with increased through transmission in the fundal portion of the endometrial canal, potentially a small gestational sac.  Yolk sac:  None.  Embryo:  None.  Cardiac Activity: None.  Heart Rate:  N/A  MSD:  3.2  mm   4 w   6  d     US EDC: 09/02/2014  Maternal uterus/adnexae: If the structure discussed above is a gestational sac there is a tiny hypoechoic crescent adjacent to it, which could represent a trace amount of subchorionic hemorrhage. No significant free fluid in the cul-de-sac. The ovaries are normal in echotexture in appearance bilaterally.   IMPRESSION: 1. Possible early IUP, as above, with estimated gestational age of [redacted] weeks and 6 days. 2. If this is in fact an early IUP, there appears to be a tiny subchorionic hemorrhage around the gestational sac.   Electronically Signed   By: Trudie Reedaniel  Entrikin M.D.   On: 12/30/2013 20:04   Results for orders placed during the hospital encounter of 12/30/13 (from the past 24 hour(s))  URINALYSIS, ROUTINE W REFLEX MICROSCOPIC     Status:  Abnormal   Collection Time    12/30/13  4:45 PM      Result Value Ref Range   Color, Urine YELLOW  YELLOW   APPearance CLOUDY (*) CLEAR   Specific Gravity, Urine 1.010  1.005 - 1.030   pH 7.5  5.0 - 8.0   Glucose, UA NEGATIVE  NEGATIVE mg/dL   Hgb urine dipstick NEGATIVE  NEGATIVE   Bilirubin Urine NEGATIVE  NEGATIVE   Ketones, ur NEGATIVE  NEGATIVE mg/dL   Protein, ur NEGATIVE  NEGATIVE mg/dL   Urobilinogen, UA 0.2  0.0 - 1.0 mg/dL   Nitrite NEGATIVE  NEGATIVE   Leukocytes, UA NEGATIVE  NEGATIVE  POCT PREGNANCY, URINE     Status: Abnormal   Collection Time    12/30/13  5:08 PM      Result Value Ref Range   Preg Test, Ur POSITIVE (*) NEGATIVE  CBC     Status: Abnormal   Collection Time    12/30/13  7:03 PM      Result Value Ref Range   WBC 7.2  4.0 - 10.5 K/uL   RBC 3.96  3.87 - 5.11 MIL/uL   Hemoglobin 12.5  12.0 - 15.0 g/dL   HCT 16.1 (*) 09.6 - 04.5 %   MCV 89.1  78.0 - 100.0 fL   MCH 31.6  26.0 - 34.0 pg   MCHC 35.4  30.0 - 36.0 g/dL   RDW 40.9  81.1 - 91.4 %   Platelets 164  150 - 400 K/uL  HCG, QUANTITATIVE, PREGNANCY     Status: Abnormal   Collection Time    12/30/13  7:03 PM      Result Value Ref Range   hCG, Beta Chain, Quant, S 1176 (*) <5 mIU/mL  WET PREP, GENITAL     Status: Abnormal   Collection Time    12/30/13  7:10 PM      Result Value Ref Range   Yeast Wet Prep HPF POC NONE SEEN  NONE SEEN   Trich, Wet Prep NONE SEEN  NONE SEEN   Clue Cells Wet Prep HPF POC NONE SEEN  NONE SEEN   WBC, Wet Prep HPF POC FEW (*) NONE SEEN    Assessment and Plan  Nausea and vomiting in pregnancy- Antivert 25mg  OTC or Rx abd pain in pregnancy- repeat HCG on Friday am  LINEBERRY,SUSAN 12/30/2013, 6:45 PM

## 2013-12-30 NOTE — MAU Provider Note (Signed)
Attestation of Attending Supervision of Advanced Practitioner (CNM/NP): Evaluation and management procedures were performed by the Advanced Practitioner under my supervision and collaboration.  I have reviewed the Advanced Practitioner's note and chart, and I agree with the management and plan.  HARRAWAY-SMITH, Oliana Gowens 10:24 PM     

## 2013-12-31 LAB — ABO/RH: ABO/RH(D): B NEG

## 2013-12-31 LAB — GC/CHLAMYDIA PROBE AMP
CT PROBE, AMP APTIMA: NEGATIVE
GC PROBE AMP APTIMA: NEGATIVE

## 2014-01-02 ENCOUNTER — Inpatient Hospital Stay (HOSPITAL_COMMUNITY)
Admission: AD | Admit: 2014-01-02 | Discharge: 2014-01-02 | Disposition: A | Discharge status: Home / Self Care | Payer: Medicaid Other | Source: Ambulatory Visit | Attending: Obstetrics and Gynecology | Admitting: Obstetrics and Gynecology

## 2014-01-02 DIAGNOSIS — O99891 Other specified diseases and conditions complicating pregnancy: Secondary | ICD-10-CM | POA: Insufficient documentation

## 2014-01-02 DIAGNOSIS — Z3490 Encounter for supervision of normal pregnancy, unspecified, unspecified trimester: Secondary | ICD-10-CM

## 2014-01-02 DIAGNOSIS — R110 Nausea: Secondary | ICD-10-CM

## 2014-01-02 DIAGNOSIS — G43909 Migraine, unspecified, not intractable, without status migrainosus: Secondary | ICD-10-CM | POA: Diagnosis not present

## 2014-01-02 DIAGNOSIS — O210 Mild hyperemesis gravidarum: Secondary | ICD-10-CM | POA: Diagnosis not present

## 2014-01-02 DIAGNOSIS — O9989 Other specified diseases and conditions complicating pregnancy, childbirth and the puerperium: Principal | ICD-10-CM

## 2014-01-02 DIAGNOSIS — G43809 Other migraine, not intractable, without status migrainosus: Secondary | ICD-10-CM

## 2014-01-02 DIAGNOSIS — R109 Unspecified abdominal pain: Secondary | ICD-10-CM

## 2014-01-02 DIAGNOSIS — O26899 Other specified pregnancy related conditions, unspecified trimester: Secondary | ICD-10-CM

## 2014-01-02 LAB — HCG, QUANTITATIVE, PREGNANCY: HCG, BETA CHAIN, QUANT, S: 3451 m[IU]/mL — AB (ref <5)

## 2014-01-02 NOTE — MAU Provider Note (Signed)
Attestation of Attending Supervision of Advanced Practitioner (CNM/NP): Evaluation and management procedures were performed by the Advanced Practitioner under my supervision and collaboration.  I have reviewed the Advanced Practitioner's note and chart, and I agree with the management and plan.  Kaysie Michelini 01/02/2014 3:13 PM   

## 2014-01-02 NOTE — MAU Note (Signed)
Here for follow up. Been doing ok, some cramping.  On going nauseated. Is constipated

## 2014-01-02 NOTE — MAU Provider Note (Signed)
  History     CSN: [id number]  Arrival date and time: 01/02/14 1135   None     Chief Complaint  Patient presents with  . Follow-up   HPI Comments: [name] 23 y.o. G1P0 533w2d presents to MAU for follow on BHCG. On 8/11 she was seen for left lower quad pain. Her ultrasound showed gestational sac, with no yolk sac at 4 weeks and 6 days. She only has a small amount of cramping now that she feels is related to constipation. No bleeding. She will be receive her prenatal care at [facility] in [place].      Past Medical History  Diagnosis Date  . Chest pain, unspecified   . Dyspnea   . Hyperventilation   . Panic attack   . Anxiety and depression   . Malaise and fatigue   . Allergy   . Panic attack   . Anxiety   . Gluten intolerance   . IBS (irritable bowel syndrome)     Past Surgical History  Procedure Laterality Date  . Tonsillectomy    . Adenoidectomy    . Wisdom tooth extraction      Family History  Problem Relation Age of Onset  . Hypertension Mother   . Cancer Mother   . Heart disease Maternal Grandfather     History  Substance Use Topics  . Smoking status: Never Smoker   . Smokeless tobacco: Never Used  . Alcohol Use: No    Allergies:  Allergies  Allergen Reactions  . Sulfa Antibiotics   . Septra [Sulfamethoxazole-Trimethoprim] Rash    Prescriptions prior to admission  Medication Sig Dispense Refill  . meclizine (ANTIVERT) 25 MG tablet Use 3 times daily as needed for nausea  30 tablet  0    Review of Systems  Constitutional: Negative.   Eyes: Negative.   Respiratory: Negative.   Cardiovascular: Negative.   Gastrointestinal: Positive for nausea and vomiting.       Slight amount cramping  Genitourinary: Negative.   Musculoskeletal: Negative.   Skin: Negative.   Neurological: Positive for headaches.  Psychiatric/Behavioral: Negative.    Physical Exam   Blood pressure 121/82, pulse 94, temperature 98.8 F (37.1 C), temperature source  Oral, resp. rate 16, last menstrual period 11/26/2013.  Physical Exam  Constitutional: She is oriented to person, place, and time. She appears well-developed and well-nourished. No distress.  Developing migraine  HENT:  Head: Normocephalic and atraumatic.  Eyes: Conjunctivae are normal. Pupils are equal, round, and reactive to light.  Musculoskeletal: Normal range of motion.  Neurological: She is alert and oriented to person, place, and time.  Skin: Skin is warm and dry.  Psychiatric: She has a normal mood and affect. Her behavior is normal. Judgment and thought content normal.    MAU Course  Procedures  MDM  Lab Results  Component Value Date   HCGBETAQNT 3451* 01/02/2014   HCGBETAQNT 1176* 12/30/2013      Assessment and Plan   A: Pregnancy Abdominal Cramping Nausea  Migraine  P: Schedule repeat U/S for next Friday Pt has nausea medications Tylenol for pain Asked for note for work today Establish prenatal care asap   [name], [name] 01/02/2014, 1:58 PM

## 2014-01-02 NOTE — Discharge Instructions (Signed)
First Trimester of Pregnancy °The first trimester of pregnancy is from week 1 until the end of week 12 (months 1 through 3). A week after a sperm fertilizes an egg, the egg will implant on the wall of the uterus. This embryo will begin to develop into a baby. Genes from you and your partner are forming the baby. The female genes determine whether the baby is a boy or a girl. At 6-8 weeks, the eyes and face are formed, and the heartbeat can be seen on ultrasound. At the end of 12 weeks, all the baby's organs are formed.  °Now that you are pregnant, you will want to do everything you can to have a healthy baby. Two of the most important things are to get good prenatal care and to follow your health care provider's instructions. Prenatal care is all the medical care you receive before the baby's birth. This care will help prevent, find, and treat any problems during the pregnancy and childbirth. °BODY CHANGES °Your body goes through many changes during pregnancy. The changes vary from woman to woman.  °· You may gain or lose a couple of pounds at first. °· You may feel sick to your stomach (nauseous) and throw up (vomit). If the vomiting is uncontrollable, call your health care provider. °· You may tire easily. °· You may develop headaches that can be relieved by medicines approved by your health care provider. °· You may urinate more often. Painful urination may mean you have a bladder infection. °· You may develop heartburn as a result of your pregnancy. °· You may develop constipation because certain hormones are causing the muscles that push waste through your intestines to slow down. °· You may develop hemorrhoids or swollen, bulging veins (varicose veins). °· Your breasts may begin to grow larger and become tender. Your nipples may stick out more, and the tissue that surrounds them (areola) may become darker. °· Your gums may bleed and may be sensitive to brushing and flossing. °· Dark spots or blotches (chloasma,  mask of pregnancy) may develop on your face. This will likely fade after the baby is born. °· Your menstrual periods will stop. °· You may have a loss of appetite. °· You may develop cravings for certain kinds of food. °· You may have changes in your emotions from day to day, such as being excited to be pregnant or being concerned that something may go wrong with the pregnancy and baby. °· You may have more vivid and strange dreams. °· You may have changes in your hair. These can include thickening of your hair, rapid growth, and changes in texture. Some women also have hair loss during or after pregnancy, or hair that feels dry or thin. Your hair will most likely return to normal after your baby is born. °WHAT TO EXPECT AT YOUR PRENATAL VISITS °During a routine prenatal visit: °· You will be weighed to make sure you and the baby are growing normally. °· Your blood pressure will be taken. °· Your abdomen will be measured to track your baby's growth. °· The fetal heartbeat will be listened to starting around week 10 or 12 of your pregnancy. °· Test results from any previous visits will be discussed. °Your health care provider may ask you: °· How you are feeling. °· If you are feeling the baby move. °· If you have had any abnormal symptoms, such as leaking fluid, bleeding, severe headaches, or abdominal cramping. °· If you have any questions. °Other tests   that may be performed during your first trimester include: °· Blood tests to find your blood type and to check for the presence of any previous infections. They will also be used to check for low iron levels (anemia) and Rh antibodies. Later in the pregnancy, blood tests for diabetes will be done along with other tests if problems develop. °· Urine tests to check for infections, diabetes, or protein in the urine. °· An ultrasound to confirm the proper growth and development of the baby. °· An amniocentesis to check for possible genetic problems. °· Fetal screens for  spina bifida and Down syndrome. °· You may need other tests to make sure you and the baby are doing well. °HOME CARE INSTRUCTIONS  °Medicines °· Follow your health care provider's instructions regarding medicine use. Specific medicines may be either safe or unsafe to take during pregnancy. °· Take your prenatal vitamins as directed. °· If you develop constipation, try taking a stool softener if your health care provider approves. °Diet °· Eat regular, well-balanced meals. Choose a variety of foods, such as meat or vegetable-based protein, fish, milk and low-fat dairy products, vegetables, fruits, and whole grain breads and cereals. Your health care provider will help you determine the amount of weight gain that is right for you. °· Avoid raw meat and uncooked cheese. These carry germs that can cause birth defects in the baby. °· Eating four or five small meals rather than three large meals a day may help relieve nausea and vomiting. If you start to feel nauseous, eating a few soda crackers can be helpful. Drinking liquids between meals instead of during meals also seems to help nausea and vomiting. °· If you develop constipation, eat more high-fiber foods, such as fresh vegetables or fruit and whole grains. Drink enough fluids to keep your urine clear or pale yellow. °Activity and Exercise °· Exercise only as directed by your health care provider. Exercising will help you: °¨ Control your weight. °¨ Stay in shape. °¨ Be prepared for labor and delivery. °· Experiencing pain or cramping in the lower abdomen or low back is a good sign that you should stop exercising. Check with your health care provider before continuing normal exercises. °· Try to avoid standing for long periods of time. Move your legs often if you must stand in one place for a long time. °· Avoid heavy lifting. °· Wear low-heeled shoes, and practice good posture. °· You may continue to have sex unless your health care provider directs you  otherwise. °Relief of Pain or Discomfort °· Wear a good support bra for breast tenderness.   °· Take warm sitz baths to soothe any pain or discomfort caused by hemorrhoids. Use hemorrhoid cream if your health care provider approves.   °· Rest with your legs elevated if you have leg cramps or low back pain. °· If you develop varicose veins in your legs, wear support hose. Elevate your feet for 15 minutes, 3-4 times a day. Limit salt in your diet. °Prenatal Care °· Schedule your prenatal visits by the twelfth week of pregnancy. They are usually scheduled monthly at first, then more often in the last 2 months before delivery. °· Write down your questions. Take them to your prenatal visits. °· Keep all your prenatal visits as directed by your health care provider. °Safety °· Wear your seat belt at all times when driving. °· Make a list of emergency phone numbers, including numbers for family, friends, the hospital, and police and fire departments. °General Tips °·   Ask your health care provider for a referral to a local prenatal education class. Begin classes no later than at the beginning of month 6 of your pregnancy. °· Ask for help if you have counseling or nutritional needs during pregnancy. Your health care provider can offer advice or refer you to specialists for help with various needs. °· Do not use hot tubs, steam rooms, or saunas. °· Do not douche or use tampons or scented sanitary pads. °· Do not cross your legs for long periods of time. °· Avoid cat litter boxes and soil used by cats. These carry germs that can cause birth defects in the baby and possibly loss of the fetus by miscarriage or stillbirth. °· Avoid all smoking, herbs, alcohol, and medicines not prescribed by your health care provider. Chemicals in these affect the formation and growth of the baby. °· Schedule a dentist appointment. At home, brush your teeth with a soft toothbrush and be gentle when you floss. °SEEK MEDICAL CARE IF:  °· You have  dizziness. °· You have mild pelvic cramps, pelvic pressure, or nagging pain in the abdominal area. °· You have persistent nausea, vomiting, or diarrhea. °· You have a bad smelling vaginal discharge. °· You have pain with urination. °· You notice increased swelling in your face, hands, legs, or ankles. °SEEK IMMEDIATE MEDICAL CARE IF:  °· You have a fever. °· You are leaking fluid from your vagina. °· You have spotting or bleeding from your vagina. °· You have severe abdominal cramping or pain. °· You have rapid weight gain or loss. °· You vomit blood or material that looks like coffee grounds. °· You are exposed to German measles and have never had them. °· You are exposed to fifth disease or chickenpox. °· You develop a severe headache. °· You have shortness of breath. °· You have any kind of trauma, such as from a fall or a car accident. °Document Released: 05/02/2001 Document Revised: 09/22/2013 Document Reviewed: 03/18/2013 °ExitCare® Patient Information ©2015 [employer]. This information is not intended to replace advice given to you by your health care provider. Make sure you discuss any questions you have with your health care provider. °Ectopic Pregnancy °An ectopic pregnancy is when the fertilized egg attaches (implants) outside the uterus. Most ectopic pregnancies occur in the fallopian tube. Rarely do ectopic pregnancies occur on the ovary, intestine, pelvis, or cervix. In an ectopic pregnancy, the fertilized egg does not have the ability to develop into a normal, healthy baby.  °A ruptured ectopic pregnancy is one in which the fallopian tube gets torn or bursts and results in internal bleeding. Often there is intense abdominal pain, and sometimes, vaginal bleeding. Having an ectopic pregnancy can be life threatening. If left untreated, this dangerous condition can lead to a blood transfusion, abdominal surgery, or even death. °CAUSES  °Damage to the fallopian tubes is the suspected cause in most  ectopic pregnancies.  °RISK FACTORS °Depending on your circumstances, the risk of having an ectopic pregnancy will vary. The level of risk can be divided into three categories. °High Risk °· You have gone through infertility treatment. °· You have had a previous ectopic pregnancy. °· You have had previous tubal surgery. °· You have had previous surgery to have the fallopian tubes tied (tubal ligation). °· You have tubal problems or diseases. °· You have been exposed to DES. DES is a medicine that was used until 1971 and had effects on babies whose mothers took the medicine. °· You become   pregnant while using an intrauterine device (IUD) for birth control.  °Moderate Risk °· You have a history of infertility. °· You have a history of a sexually transmitted infection (STI). °· You have a history of pelvic inflammatory disease (PID). °· You have scarring from endometriosis. °· You have multiple sexual partners. °· You smoke.  °Low Risk °· You have had previous pelvic surgery. °· You use vaginal douching. °· You became sexually active before 24 years of age. °SIGNS AND SYMPTOMS  °An ectopic pregnancy should be suspected in anyone who has missed a period and has abdominal pain or bleeding. °· You may experience normal pregnancy symptoms, such as: °· Nausea. °· Tiredness. °· Breast tenderness. °· Other symptoms may include: °· Pain with intercourse. °· Irregular vaginal bleeding or spotting. °· Cramping or pain on one side or in the lower abdomen. °· Fast heartbeat. °· Passing out while having a bowel movement. °· Symptoms of a ruptured ectopic pregnancy and internal bleeding may include: °· Sudden, severe pain in the abdomen and pelvis. °· Dizziness or fainting. °· Pain in the shoulder area. °DIAGNOSIS  °Tests that may be performed include: °· A pregnancy test. °· An ultrasound test. °· Testing the specific level of pregnancy hormone in the bloodstream. °· Taking a sample of uterus tissue (dilation and curettage,  D&C). °· Surgery to perform a visual exam of the inside of the abdomen using a thin, lighted tube with a tiny camera on the end (laparoscope). °TREATMENT  °An injection of a medicine called methotrexate may be given. This medicine causes the pregnancy tissue to be absorbed. It is given if: °· The diagnosis is made early. °· The fallopian tube has not ruptured. °· You are considered to be a good candidate for the medicine. °Usually, pregnancy hormone blood levels are checked after methotrexate treatment. This is to be sure the medicine is effective. It may take 4-6 weeks for the pregnancy to be absorbed (though most pregnancies will be absorbed by 3 weeks). °Surgical treatment may be needed. A laparoscope may be used to remove the pregnancy tissue. If severe internal bleeding occurs, a cut (incision) may be made in the lower abdomen (laparotomy), and the ectopic pregnancy is removed. This stops the bleeding. Part of the fallopian tube, or the whole tube, may be removed as well (salpingectomy). After surgery, pregnancy hormone tests may be done to be sure there is no pregnancy tissue left. You may receive a Rho (D) immune globulin shot if you are Rh negative and the father is Rh positive, or if you do not know the Rh type of the father. This is to prevent problems with any future pregnancy. °SEEK IMMEDIATE MEDICAL CARE IF:  °You have any symptoms of an ectopic pregnancy. This is a medical emergency. °MAKE SURE YOU: °· Understand these instructions. °· Will watch your condition. °· Will get help right away if you are not doing well or get worse. °Document Released: 06/15/2004 Document Revised: 09/22/2013 Document Reviewed: 12/05/2012 °ExitCare® Patient Information ©2015 [employer]. This information is not intended to replace advice given to you by your health care provider. Make sure you discuss any questions you have with your health care provider. ° °

## 2014-01-08 ENCOUNTER — Telehealth: Payer: Self-pay | Admitting: *Deleted

## 2014-01-08 DIAGNOSIS — O3680X1 Pregnancy with inconclusive fetal viability, fetus 1: Secondary | ICD-10-CM

## 2014-01-08 NOTE — Telephone Encounter (Addendum)
Pt left message stating that she was supposed to be scheduled for US on 8/21 and has not received a call regarding the appt.  Please call back.  Per chart review, pt was seen @ [facility] on 8/14 for repeat BHCG. The provider notes indicate that the plan was for repeat US on 8/21 however an order for such was not placed.  I returned pt's call and discussed her request/concern. I first established that she is still planning to receive prenatal care @ [facility] in [place].  Pt stated yes but she has not made appt as she is waiting for her Medicaid card. I advised her that the practice may not require her to have the card and she should call their office to inquire. I then stated that her doctor may want her to have the ultrasound at one of their affiliated facilities instead.  Pt stated that Jannifer RodneyLinda Barefoot had specifically stated that she should have the ultrasound @ [facility] tomorrow and that is what she would like to do.  I attempted to schedule the US appt but there were no available openings.  Pt was then scheduled for 01/13/14 @ 0815.  Pt was informed that she may call back to the US dept if this appt conflicts with her work schedule. Pt agreed and voiced understanding of all information given.

## 2014-01-13 ENCOUNTER — Ambulatory Visit (HOSPITAL_COMMUNITY)
Admission: RE | Admit: 2014-01-13 | Discharge: 2014-01-13 | Disposition: A | Discharge status: Home / Self Care | Payer: Medicaid Other | Source: Ambulatory Visit | Attending: Family Medicine | Admitting: Family Medicine

## 2014-01-13 DIAGNOSIS — O3680X0 Pregnancy with inconclusive fetal viability, not applicable or unspecified: Secondary | ICD-10-CM | POA: Insufficient documentation

## 2014-01-13 DIAGNOSIS — O3680X1 Pregnancy with inconclusive fetal viability, fetus 1: Secondary | ICD-10-CM

## 2014-01-23 ENCOUNTER — Emergency Department: Payer: Self-pay | Admitting: Emergency Medicine

## 2014-01-23 LAB — URINALYSIS, COMPLETE
BILIRUBIN, UR: NEGATIVE
BLOOD: NEGATIVE
GLUCOSE, UR: NEGATIVE mg/dL (ref 0–75)
LEUKOCYTE ESTERASE: NEGATIVE
Nitrite: NEGATIVE
Ph: 7 (ref 4.5–8.0)
Protein: NEGATIVE
RBC,UR: 1 /HPF (ref 0–5)
Specific Gravity: 1.009 (ref 1.003–1.030)
WBC UR: 2 /HPF (ref 0–5)

## 2014-01-23 LAB — CBC WITH DIFFERENTIAL/PLATELET
Basophil #: 0 10*3/uL (ref 0.0–0.1)
Basophil %: 0.3 %
EOS ABS: 0 10*3/uL (ref 0.0–0.7)
Eosinophil %: 0.3 %
HCT: 40.6 % (ref 35.0–47.0)
HGB: 13.7 g/dL (ref 12.0–16.0)
Lymphocyte #: 1.7 10*3/uL (ref 1.0–3.6)
Lymphocyte %: 21.8 %
MCH: 30.9 pg (ref 26.0–34.0)
MCHC: 33.6 g/dL (ref 32.0–36.0)
MCV: 92 fL (ref 80–100)
Monocyte #: 0.4 x10 3/mm (ref 0.2–0.9)
Monocyte %: 5.1 %
NEUTROS ABS: 5.7 10*3/uL (ref 1.4–6.5)
Neutrophil %: 72.5 %
Platelet: 172 10*3/uL (ref 150–440)
RBC: 4.42 10*6/uL (ref 3.80–5.20)
RDW: 12.2 % (ref 11.5–14.5)
WBC: 7.9 10*3/uL (ref 3.6–11.0)

## 2014-01-23 LAB — COMPREHENSIVE METABOLIC PANEL
ALT: 14 U/L
Albumin: 3.9 g/dL (ref 3.4–5.0)
Alkaline Phosphatase: 82 U/L
Anion Gap: 8 (ref 7–16)
BUN: 4 mg/dL — ABNORMAL LOW (ref 7–18)
Bilirubin,Total: 0.8 mg/dL (ref 0.2–1.0)
CALCIUM: 9.3 mg/dL (ref 8.5–10.1)
CREATININE: 0.46 mg/dL — AB (ref 0.60–1.30)
Chloride: 105 mmol/L (ref 98–107)
Co2: 20 mmol/L — ABNORMAL LOW (ref 21–32)
EGFR (Non-African Amer.): >60
Glucose: 82 mg/dL (ref 65–99)
Osmolality: 262 (ref 275–301)
POTASSIUM: 4.5 mmol/L (ref 3.5–5.1)
SGOT(AST): 15 U/L (ref 15–37)
SODIUM: 133 mmol/L — AB (ref 136–145)
Total Protein: 7.8 g/dL (ref 6.4–8.2)

## 2014-01-23 LAB — PREGNANCY, URINE: PREGNANCY TEST, URINE: POSITIVE m[IU]/mL

## 2014-01-25 LAB — URINE CULTURE

## 2014-02-02 ENCOUNTER — Encounter (HOSPITAL_COMMUNITY): Payer: Self-pay | Admitting: *Deleted

## 2014-02-02 ENCOUNTER — Inpatient Hospital Stay (HOSPITAL_COMMUNITY)
Admission: AD | Admit: 2014-02-02 | Discharge: 2014-02-02 | Disposition: A | Discharge status: Home / Self Care | Payer: Medicaid Other | Source: Ambulatory Visit | Attending: Family Medicine | Admitting: Family Medicine

## 2014-02-02 DIAGNOSIS — O210 Mild hyperemesis gravidarum: Secondary | ICD-10-CM | POA: Insufficient documentation

## 2014-02-02 DIAGNOSIS — O219 Vomiting of pregnancy, unspecified: Secondary | ICD-10-CM

## 2014-02-02 LAB — URINALYSIS, ROUTINE W REFLEX MICROSCOPIC
BILIRUBIN URINE: NEGATIVE
Glucose, UA: NEGATIVE mg/dL
Hgb urine dipstick: NEGATIVE
KETONES UR: 15 mg/dL — AB
Leukocytes, UA: NEGATIVE
NITRITE: NEGATIVE
PH: 6 (ref 5.0–8.0)
PROTEIN: NEGATIVE mg/dL
Specific Gravity, Urine: 1.025 (ref 1.005–1.030)
UROBILINOGEN UA: 1 mg/dL (ref 0.0–1.0)

## 2014-02-02 MED ORDER — PROMETHAZINE HCL 25 MG PO TABS
25.0000 mg | ORAL_TABLET | ORAL | Status: AC
  Administered 2014-02-02: 25 mg via ORAL
  Filled 2014-02-02: qty 1

## 2014-02-02 MED ORDER — METOCLOPRAMIDE HCL 10 MG PO TABS: 10.0000 mg | ORAL_TABLET | Freq: Three times a day (TID) | ORAL | Status: DC | PRN

## 2014-02-02 MED ORDER — PROMETHAZINE HCL 25 MG/ML IJ SOLN
25.0000 mg | INTRAMUSCULAR | Status: AC
  Administered 2014-02-02: 25 mg via INTRAMUSCULAR
  Filled 2014-02-02: qty 1

## 2014-02-02 NOTE — MAU Note (Signed)
hasn't been able to keep anything down all morning.  Gets hot, then cold chills.  Feels like going to have BM, but nothing happens.

## 2014-02-02 NOTE — Discharge Instructions (Signed)

## 2014-02-02 NOTE — MAU Provider Note (Signed)
History     CSN: [id number]  Arrival date and time: 02/02/14 1339   First Provider Initiated Contact with Patient 02/02/14 1636      Chief Complaint  Patient presents with  . Emesis   HPI [name] 23 y.o. G1P0 at [redacted]w[redacted]d presents to [facility] complaining of nausea and vomiting.  She generally vomits 3-4 times per day but today it is much worse.  This morning she tried to eat french toast sticks but nothing since then.  She cannot vomit anymore as there is nothing left to come up.  When she is at home, she takes Antivert but this has not been much help.  Gatorade or Sprite sometimes helps a little bit.  Smells, smoke and standing for long periods of time make her nausea worse.  She has not yet started prenatal care.  She also reports abdominal pain.  She denies vaginal bleeding, discharge, dysuria.   OB History   Grav Para Term Preterm Abortions TAB SAB Ect Mult Living   1         0      Past Medical History  Diagnosis Date  . Chest pain, unspecified   . Dyspnea   . Hyperventilation   . Panic attack   . Anxiety and depression   . Malaise and fatigue   . Allergy   . Panic attack   . Anxiety   . Gluten intolerance   . IBS (irritable bowel syndrome)     Past Surgical History  Procedure Laterality Date  . Tonsillectomy    . Adenoidectomy    . Wisdom tooth extraction      Family History  Problem Relation Age of Onset  . Hypertension Mother   . Cancer Mother   . Heart disease Maternal Grandfather     History  Substance Use Topics  . Smoking status: Never Smoker   . Smokeless tobacco: Never Used  . Alcohol Use: No    Allergies:  Allergies  Allergen Reactions  . Septra [Sulfamethoxazole-Trimethoprim] Rash  . Sulfa Antibiotics Rash    Prescriptions prior to admission  Medication Sig Dispense Refill  . diphenhydrAMINE (BENADRYL) 25 MG tablet Take 25 mg by mouth at bedtime as needed for sleep.      . Prenatal Vit-Min-FA-Fish Oil ([facility] PRENATAL GUMMY) 0.4-113.5 MG  CHEW Chew 2 tablets by mouth daily.      . meclizine (ANTIVERT) 25 MG tablet Use 3 times daily as needed for nausea  30 tablet  0    Review of Systems  Constitutional: Positive for chills, weight loss and diaphoresis. Negative for fever.  HENT: Negative for congestion and sore throat.   Eyes: Negative for blurred vision and double vision.  Respiratory: Negative for cough, shortness of breath and wheezing.   Cardiovascular: Negative for chest pain and palpitations.  Gastrointestinal: Positive for heartburn, nausea, vomiting, abdominal pain and constipation. Negative for diarrhea.  Genitourinary: Positive for frequency. Negative for dysuria and hematuria.  Musculoskeletal: Negative for back pain.  Skin: Negative for itching and rash.  Neurological: Positive for dizziness, tingling, weakness and headaches.  Psychiatric/Behavioral: Negative for depression, suicidal ideas and substance abuse. The patient is not nervous/anxious.    Physical Exam   Blood pressure 117/73, pulse 74, temperature 98.9 F (37.2 C), temperature source Oral, resp. rate 18, height  (1.6 m), weight 64.864 kg (143 lb), last menstrual period 11/26/2013.  Physical Exam  Constitutional: She is oriented to person, place, and time. She appears well-developed and well-nourished.  No distress.  HENT:  Head: Normocephalic.  Eyes: EOM are normal.  Neck: Normal range of motion.  Cardiovascular: Normal rate, regular rhythm and normal heart sounds.  Exam reveals no gallop and no friction rub.   No murmur heard. Respiratory: Effort normal and breath sounds normal. No respiratory distress.  GI: Soft. Bowel sounds are normal. She exhibits no distension. There is tenderness.  Mild diffuse tenderness  Musculoskeletal: Normal range of motion.  Neurological: She is alert and oriented to person, place, and time.  Skin: Skin is warm and dry.  Psychiatric: She has a normal mood and affect.   Results for orders placed during the  hospital encounter of 02/02/14 (from the past 24 hour(s))  URINALYSIS, ROUTINE W REFLEX MICROSCOPIC     Status: Abnormal   Collection Time    02/02/14  2:25 PM      Result Value Ref Range   Color, Urine YELLOW  YELLOW   APPearance HAZY (*) CLEAR   Specific Gravity, Urine 1.025  1.005 - 1.030   pH 6.0  5.0 - 8.0   Glucose, UA NEGATIVE  NEGATIVE mg/dL   Hgb urine dipstick NEGATIVE  NEGATIVE   Bilirubin Urine NEGATIVE  NEGATIVE   Ketones, ur 15 (*) NEGATIVE mg/dL   Protein, ur NEGATIVE  NEGATIVE mg/dL   Urobilinogen, UA 1.0  0.0 - 1.0 mg/dL   Nitrite NEGATIVE  NEGATIVE   Leukocytes, UA NEGATIVE  NEGATIVE     MAU Course  Procedures none MDM PO phenergan was ordered - per pt request.  She vomited immediately after taking the medication.  IM phenergan was then ordered.  Pt fell asleep but awakened to report that she was feeling much better.   Assessment and Plan  A: Nausea and vomiting in pregnancy  P: Discharge to home Reglan  po up to TID for nausea Frequent small meals.  Hydrate well Return to MAU for emergency Establish [facility] asap.  Info given on providers.    Bertram Denver 02/02/2014, 4:37 PM

## 2014-02-04 NOTE — MAU Provider Note (Signed)
Attestation of Attending Supervision of Advanced Practitioner (PA/CNM/NP): Evaluation and management procedures were performed by the Advanced Practitioner under my supervision and collaboration.  I have reviewed the Advanced Practitioner's note and chart, and I agree with the management and plan.  Jasmane Brockway, DO Attending Physician Faculty Practice, [facility]  

## 2014-02-18 ENCOUNTER — Emergency Department: Payer: Self-pay | Admitting: Emergency Medicine

## 2014-02-18 LAB — URINALYSIS, COMPLETE
Bilirubin,UR: NEGATIVE
Blood: NEGATIVE
Glucose,UR: NEGATIVE mg/dL (ref 0–75)
Ketone: NEGATIVE
Leukocyte Esterase: NEGATIVE
Nitrite: NEGATIVE
Ph: 5 (ref 4.5–8.0)
Protein: 30
Specific Gravity: 1.025 (ref 1.003–1.030)
WBC UR: 33 /HPF (ref 0–5)

## 2014-02-18 LAB — BASIC METABOLIC PANEL
ANION GAP: 8 (ref 7–16)
BUN: 5 mg/dL — AB (ref 7–18)
CREATININE: 0.5 mg/dL — AB (ref 0.60–1.30)
Calcium, Total: 9.5 mg/dL (ref 8.5–10.1)
Chloride: 106 mmol/L (ref 98–107)
Co2: 24 mmol/L (ref 21–32)
GLUCOSE: 82 mg/dL (ref 65–99)
OSMOLALITY: 272 (ref 275–301)
Potassium: 3.4 mmol/L — ABNORMAL LOW (ref 3.5–5.1)
SODIUM: 138 mmol/L (ref 136–145)

## 2014-02-18 LAB — CBC WITH DIFFERENTIAL/PLATELET
Basophil #: 0 10*3/uL (ref 0.0–0.1)
Basophil %: 0.2 %
EOS ABS: 0 10*3/uL (ref 0.0–0.7)
Eosinophil %: 0.4 %
HCT: 38.3 % (ref 35.0–47.0)
HGB: 13.1 g/dL (ref 12.0–16.0)
LYMPHS ABS: 2.2 10*3/uL (ref 1.0–3.6)
Lymphocyte %: 23.1 %
MCH: 31 pg (ref 26.0–34.0)
MCHC: 34.3 g/dL (ref 32.0–36.0)
MCV: 90 fL (ref 80–100)
MONO ABS: 0.3 x10 3/mm (ref 0.2–0.9)
MONOS PCT: 3.5 %
NEUTROS ABS: 7.1 10*3/uL — AB (ref 1.4–6.5)
NEUTROS PCT: 72.8 %
PLATELETS: 184 10*3/uL (ref 150–440)
RBC: 4.25 10*6/uL (ref 3.80–5.20)
RDW: 12.4 % (ref 11.5–14.5)
WBC: 9.7 10*3/uL (ref 3.6–11.0)

## 2014-02-19 LAB — HCG, QUANTITATIVE, PREGNANCY: Beta Hcg, Quant.: 145616 m[IU]/mL — ABNORMAL HIGH

## 2014-02-19 LAB — WET PREP, GENITAL

## 2014-02-21 ENCOUNTER — Emergency Department: Payer: Self-pay | Admitting: Emergency Medicine

## 2014-02-21 LAB — BASIC METABOLIC PANEL
Anion Gap: 8 (ref 7–16)
BUN: 6 mg/dL — AB (ref 7–18)
CHLORIDE: 113 mmol/L — AB (ref 98–107)
CREATININE: 0.46 mg/dL — AB (ref 0.60–1.30)
Calcium, Total: 7.7 mg/dL — ABNORMAL LOW (ref 8.5–10.1)
Co2: 21 mmol/L (ref 21–32)
EGFR (African American): >60
GLUCOSE: 74 mg/dL (ref 65–99)
OSMOLALITY: 279 (ref 275–301)
Potassium: 3.3 mmol/L — ABNORMAL LOW (ref 3.5–5.1)
Sodium: 142 mmol/L (ref 136–145)

## 2014-02-21 LAB — CBC WITH DIFFERENTIAL/PLATELET
BASOS ABS: 0 10*3/uL (ref 0.0–0.1)
BASOS PCT: 0.1 %
EOS PCT: 0.4 %
Eosinophil #: 0 10*3/uL (ref 0.0–0.7)
HCT: 37.4 % (ref 35.0–47.0)
HGB: 12.9 g/dL (ref 12.0–16.0)
LYMPHS PCT: 17.3 %
Lymphocyte #: 1.4 10*3/uL (ref 1.0–3.6)
MCH: 31.1 pg (ref 26.0–34.0)
MCHC: 34.6 g/dL (ref 32.0–36.0)
MCV: 90 fL (ref 80–100)
MONO ABS: 0.5 x10 3/mm (ref 0.2–0.9)
Monocyte %: 6 %
NEUTROS ABS: 6.3 10*3/uL (ref 1.4–6.5)
Neutrophil %: 76.2 %
Platelet: 171 10*3/uL (ref 150–440)
RBC: 4.17 10*6/uL (ref 3.80–5.20)
RDW: 12.5 % (ref 11.5–14.5)
WBC: 8.2 10*3/uL (ref 3.6–11.0)

## 2014-02-21 LAB — URINALYSIS, COMPLETE
BLOOD: NEGATIVE
Bilirubin,UR: NEGATIVE
Glucose,UR: NEGATIVE mg/dL (ref 0–75)
Leukocyte Esterase: NEGATIVE
Nitrite: NEGATIVE
PH: 8 (ref 4.5–8.0)
PROTEIN: NEGATIVE
RBC,UR: 6 /HPF (ref 0–5)
SPECIFIC GRAVITY: 1.009 (ref 1.003–1.030)

## 2014-02-22 ENCOUNTER — Emergency Department: Payer: Self-pay | Admitting: Emergency Medicine

## 2014-02-23 LAB — URINE CULTURE

## 2014-03-23 ENCOUNTER — Encounter (HOSPITAL_COMMUNITY): Payer: Self-pay | Admitting: *Deleted

## 2014-03-30 ENCOUNTER — Other Ambulatory Visit (HOSPITAL_COMMUNITY): Payer: Self-pay | Admitting: Obstetrics and Gynecology

## 2014-03-30 DIAGNOSIS — Z3689 Encounter for other specified antenatal screening: Secondary | ICD-10-CM

## 2014-04-01 ENCOUNTER — Ambulatory Visit (HOSPITAL_COMMUNITY)
Admission: RE | Admit: 2014-04-01 | Discharge: 2014-04-01 | Disposition: A | Discharge status: Home / Self Care | Payer: Medicaid Other | Source: Ambulatory Visit | Attending: Obstetrics and Gynecology | Admitting: Obstetrics and Gynecology

## 2014-04-01 ENCOUNTER — Other Ambulatory Visit (HOSPITAL_COMMUNITY): Payer: Medicaid Other

## 2014-04-01 DIAGNOSIS — Z3689 Encounter for other specified antenatal screening: Secondary | ICD-10-CM | POA: Insufficient documentation

## 2014-04-01 DIAGNOSIS — Z3A18 18 weeks gestation of pregnancy: Secondary | ICD-10-CM | POA: Insufficient documentation

## 2014-04-01 DIAGNOSIS — Z36 Encounter for antenatal screening of mother: Secondary | ICD-10-CM | POA: Insufficient documentation

## 2014-04-02 LAB — OB RESULTS CONSOLE RPR: RPR: NONREACTIVE

## 2014-04-02 LAB — OB RESULTS CONSOLE GBS: GBS: NEGATIVE

## 2014-04-02 LAB — OB RESULTS CONSOLE RUBELLA ANTIBODY, IGM: Rubella: IMMUNE

## 2014-04-02 LAB — OB RESULTS CONSOLE HIV ANTIBODY (ROUTINE TESTING): HIV: NONREACTIVE

## 2014-04-02 LAB — OB RESULTS CONSOLE GC/CHLAMYDIA
Chlamydia: NEGATIVE
Gonorrhea: NEGATIVE

## 2014-05-19 ENCOUNTER — Inpatient Hospital Stay (HOSPITAL_COMMUNITY)
Admission: AD | Admit: 2014-05-19 | Discharge: 2014-05-20 | Disposition: A | Discharge status: Home / Self Care | Payer: Medicaid Other | Source: Ambulatory Visit | Attending: Obstetrics and Gynecology | Admitting: Obstetrics and Gynecology

## 2014-05-19 ENCOUNTER — Encounter (HOSPITAL_COMMUNITY): Payer: Self-pay

## 2014-05-19 DIAGNOSIS — Z3A24 24 weeks gestation of pregnancy: Secondary | ICD-10-CM | POA: Diagnosis not present

## 2014-05-19 DIAGNOSIS — N949 Unspecified condition associated with female genital organs and menstrual cycle: Secondary | ICD-10-CM | POA: Insufficient documentation

## 2014-05-19 DIAGNOSIS — R1031 Right lower quadrant pain: Secondary | ICD-10-CM | POA: Diagnosis present

## 2014-05-19 DIAGNOSIS — R51 Headache: Secondary | ICD-10-CM | POA: Diagnosis not present

## 2014-05-19 DIAGNOSIS — O4703 False labor before 37 completed weeks of gestation, third trimester: Secondary | ICD-10-CM

## 2014-05-19 DIAGNOSIS — R519 Headache, unspecified: Secondary | ICD-10-CM

## 2014-05-19 HISTORY — DX: Migraine, unspecified, not intractable, without status migrainosus: G43.909

## 2014-05-19 LAB — CBC WITH DIFFERENTIAL/PLATELET
BASOS ABS: 0 10*3/uL (ref 0.0–0.1)
Basophils Relative: 0 % (ref 0–1)
EOS PCT: 1 % (ref 0–5)
Eosinophils Absolute: 0.1 10*3/uL (ref 0.0–0.7)
HEMATOCRIT: 29.8 % — AB (ref 36.0–46.0)
HEMOGLOBIN: 10.7 g/dL — AB (ref 12.0–15.0)
LYMPHS ABS: 1.8 10*3/uL (ref 0.7–4.0)
Lymphocytes Relative: 32 % (ref 12–46)
MCH: 32.1 pg (ref 26.0–34.0)
MCHC: 35.9 g/dL (ref 30.0–36.0)
MCV: 89.5 fL (ref 78.0–100.0)
MONO ABS: 0.5 10*3/uL (ref 0.1–1.0)
MONOS PCT: 10 % (ref 3–12)
NEUTROS ABS: 3.1 10*3/uL (ref 1.7–7.7)
NEUTROS PCT: 57 % (ref 43–77)
Platelets: 143 10*3/uL — ABNORMAL LOW (ref 150–400)
RBC: 3.33 MIL/uL — AB (ref 3.87–5.11)
RDW: 13.6 % (ref 11.5–15.5)
WBC: 5.5 10*3/uL (ref 4.0–10.5)

## 2014-05-19 LAB — URINALYSIS, ROUTINE W REFLEX MICROSCOPIC
Bilirubin Urine: NEGATIVE
Glucose, UA: NEGATIVE mg/dL
HGB URINE DIPSTICK: NEGATIVE
Ketones, ur: NEGATIVE mg/dL
Leukocytes, UA: NEGATIVE
NITRITE: NEGATIVE
PROTEIN: NEGATIVE mg/dL
Specific Gravity, Urine: <1.005 — ABNORMAL LOW (ref 1.005–1.030)
UROBILINOGEN UA: 0.2 mg/dL (ref 0.0–1.0)
pH: 6 (ref 5.0–8.0)

## 2014-05-19 MED ORDER — BUTALBITAL-APAP-CAFFEINE 50-325-40 MG PO TABS
1.0000 | ORAL_TABLET | Freq: Once | ORAL | Status: AC
  Administered 2014-05-19: 1 via ORAL
  Filled 2014-05-19: qty 1

## 2014-05-19 MED ORDER — NIFEDIPINE 10 MG PO CAPS
20.0000 mg | ORAL_CAPSULE | Freq: Once | ORAL | Status: AC
  Administered 2014-05-19: 20 mg via ORAL
  Filled 2014-05-19: qty 2

## 2014-05-19 NOTE — MAU Provider Note (Signed)
History     CSN: [id number]  Arrival date and time: 05/19/14 2159   First Provider Initiated Contact with Patient 05/19/14 2247      Chief Complaint  Patient presents with  . Abdominal Pain  . Nausea   HPI  Ms. [name] is a 23 y.o. G1P0 at 1868w6d who presents to MAU today with complaint of RLQ pain since 2030 today. She denies any previous issues with similar pain. She denies vaginal bleeding, discharge or LOF. She denies complications with this pregnancy. She states last intercourse was last night. She states pain does feel like tightening, but is constant and rated at 7/10 now. She has not taken anything for pain. She states pain is worse with standing and change or positions. She has had some nausea without vomiting or diarrhea. She denies UTI symptoms and states last BM was today. She reports good fetal movement.   OB History    Gravida Para Term Preterm AB TAB SAB Ectopic Multiple Living   1         0      Past Medical History  Diagnosis Date  . Chest pain, unspecified   . Dyspnea   . Hyperventilation   . Panic attack   . Anxiety and depression   . Malaise and fatigue   . Allergy   . Panic attack   . Anxiety   . Gluten intolerance   . IBS (irritable bowel syndrome)   . Migraine     Past Surgical History  Procedure Laterality Date  . Tonsillectomy    . Adenoidectomy    . Wisdom tooth extraction      Family History  Problem Relation Age of Onset  . Hypertension Mother   . Cancer Mother   . Heart disease Maternal Grandfather     History  Substance Use Topics  . Smoking status: Never Smoker   . Smokeless tobacco: Never Used  . Alcohol Use: No    Allergies:  Allergies  Allergen Reactions  . Septra [Sulfamethoxazole-Trimethoprim] Rash  . Sulfa Antibiotics Rash    Prescriptions prior to admission  Medication Sig Dispense Refill Last Dose  . diphenhydrAMINE (BENADRYL) 25 MG tablet Take 25 mg by mouth at bedtime as needed for sleep.   Past  Week at Unknown time  . Prenatal Vit-Min-FA-Fish Oil ([facility] PRENATAL GUMMY) 0.4-113.5 MG CHEW Chew 2 tablets by mouth daily.   05/19/2014 at Unknown time  . PRESCRIPTION MEDICATION Diclegis for nausea   05/19/2014 at Unknown time  . metoCLOPramide (REGLAN) 10 MG tablet Take 1 tablet (10 mg total) by mouth 3 (three) times daily as needed for nausea. 20 tablet 0     Review of Systems  Constitutional: Negative for fever and malaise/fatigue.  Gastrointestinal: Positive for nausea and abdominal pain. Negative for vomiting, diarrhea and constipation.  Genitourinary: Negative for dysuria, urgency and frequency.       Neg - vaginal bleeding, discharge, LOF   Physical Exam   Blood pressure 112/79, pulse 99, temperature 97.7 F (36.5 C), temperature source Oral, resp. rate 18, height 5\' 2"  (1.575 m), weight 52 lb (23.587 kg), last menstrual period 11/26/2013, SpO2 100 %.  Physical Exam  Constitutional: She is oriented to person, place, and time. She appears well-developed and well-nourished. No distress.  HENT:  Head: Normocephalic.  Cardiovascular: Normal rate.   Respiratory: Effort normal.  GI: Soft. She exhibits no distension and no mass. There is tenderness (mild tenderness to palpation of the RLQ). There is  no rebound and no guarding.  Neurological: She is alert and oriented to person, place, and time.  Skin: Skin is warm and dry. No erythema.  Psychiatric: She has a normal mood and affect.  Dilation: Closed Effacement (%): Thick Cervical Position: Posterior Station: Ballotable Exam by:: Vonzella NippleJulie Wenzel PA   Results for orders placed or performed during the hospital encounter of 05/19/14 (from the past 24 hour(s))  Urinalysis, Routine w reflex microscopic     Status: Abnormal   Collection Time: 05/19/14 10:07 PM  Result Value Ref Range   Color, Urine YELLOW YELLOW   APPearance CLEAR CLEAR   Specific Gravity, Urine <1.005 (L) 1.005 - 1.030   pH 6.0 5.0 - 8.0   Glucose, UA NEGATIVE  NEGATIVE mg/dL   Hgb urine dipstick NEGATIVE NEGATIVE   Bilirubin Urine NEGATIVE NEGATIVE   Ketones, ur NEGATIVE NEGATIVE mg/dL   Protein, ur NEGATIVE NEGATIVE mg/dL   Urobilinogen, UA 0.2 0.0 - 1.0 mg/dL   Nitrite NEGATIVE NEGATIVE   Leukocytes, UA NEGATIVE NEGATIVE  CBC with Differential     Status: Abnormal   Collection Time: 05/19/14 11:24 PM  Result Value Ref Range   WBC 5.5 4.0 - 10.5 K/uL   RBC 3.33 (L) 3.87 - 5.11 MIL/uL   Hemoglobin 10.7 (L) 12.0 - 15.0 g/dL   HCT 16.129.8 (L) 09.636.0 - 04.546.0 %   MCV 89.5 78.0 - 100.0 fL   MCH 32.1 26.0 - 34.0 pg   MCHC 35.9 30.0 - 36.0 g/dL   RDW 40.913.6 81.111.5 - 91.415.5 %   Platelets 143 (L) 150 - 400 K/uL   Neutrophils Relative % 57 43 - 77 %   Neutro Abs 3.1 1.7 - 7.7 K/uL   Lymphocytes Relative 32 12 - 46 %   Lymphs Abs 1.8 0.7 - 4.0 K/uL   Monocytes Relative 10 3 - 12 %   Monocytes Absolute 0.5 0.1 - 1.0 K/uL   Eosinophils Relative 1 0 - 5 %   Eosinophils Absolute 0.1 0.0 - 0.7 K/uL   Basophils Relative 0 0 - 1 %   Basophils Absolute 0.0 0.0 - 0.1 K/uL    Fetal Monitoring: Baseline: 125 bpm, moderate variability, + accelerations, few variable decelerations Contractions: q 2 minutes Procardia 20 mg given at 2320 Contractions: mild UI progressed to none by 2355  MAU Course  Procedures None  MDM UA today Discussed patient with Dr. Ellyn HackBovard. Recommends 20 mg PO Procardia once and CBC Patient requests something for headache pain also. Fioricet given.  Patient reports resolution of headache and significant improvement in abdominal pain Assessment and Plan  A: SIUP at 4795w6d Preterm contractions Round ligament pain Headache  P: Discharge home Preterm labor precautions discussed Patient advised to continue PO hydration Discussed use of Tylenol, abdominal binder, warm bath/shower and moderation of activity for pain Patient [facility] or sooner PRN Patient may return to MAU as needed or if  her condition were to change or worsen   Marny LowensteinJulie N Wenzel, PA-C  05/19/2014, 11:52 PM

## 2014-05-19 NOTE — MAU Note (Signed)
Pt reports she has been having lower abd cramping , mostly on right side for the last 2 hours. Also reports nausea and a headache since the abd cramping started.

## 2014-05-20 DIAGNOSIS — O4703 False labor before 37 completed weeks of gestation, third trimester: Secondary | ICD-10-CM

## 2014-05-20 DIAGNOSIS — Z3A24 24 weeks gestation of pregnancy: Secondary | ICD-10-CM

## 2014-05-20 NOTE — Discharge Instructions (Signed)
Preterm Labor Information Preterm labor is when labor starts before you are [redacted] weeks pregnant. The normal length of pregnancy is 39 to 41 weeks.  CAUSES  The cause of preterm labor is not often known. The most common known cause is infection. RISK FACTORS  Having a history of preterm labor.  Having your water break before it should.  Having a placenta that covers the opening of the cervix.  Having a placenta that breaks away from the uterus.  Having a cervix that is too weak to hold the baby in the uterus.  Having too much fluid in the amniotic sac.  Taking drugs or smoking while pregnant.  Not gaining enough weight while pregnant.  Being younger than 18 and older than 24 years old.  Having a low income.  Being African American. SYMPTOMS  Period-like cramps, belly (abdominal) pain, or back pain.  Contractions that are regular, as often as six in an hour. They may be mild or painful.  Contractions that start at the top of the belly. They then move to the lower belly and back.  Lower belly pressure that seems to get stronger.  Bleeding from the vagina.  Fluid leaking from the vagina. TREATMENT  Treatment depends on:  Your condition.  The condition of your baby.  How many weeks pregnant you are. Your doctor may have you:  Take medicine to stop contractions.  Stay in bed except to use the restroom (bed rest).  Stay in the hospital. WHAT SHOULD YOU DO IF YOU THINK YOU ARE IN PRETERM LABOR? Call your doctor right away. You need to go to the hospital right away.  HOW CAN YOU PREVENT PRETERM LABOR IN FUTURE PREGNANCIES?  Stop smoking, if you smoke.  Maintain healthy weight gain.  Do not take drugs or be around chemicals that are not needed.  Tell your doctor if you think you have an infection.  Tell your doctor if you had a preterm labor before. Document Released: 08/04/2008 Document Revised: 02/26/2013 Document Reviewed: 08/04/2008 Exit[employer] Patient  Information 2015 [employer]. This information is not intended to replace advice given to you by your health care provider. Make sure you discuss any questions you have with your health care provider.  

## 2014-05-22 NOTE — L&D Delivery Note (Signed)
Delivery Note At 2:12 AM a viable and healthy female was delivered via Vaginal, Spontaneous Delivery (Presentation: Left Occiput Anterior).  APGAR: 7, 9; weight P .   Placenta status: Intact, Spontaneous.  Cord: 3 vessels with the following complications: None.    Anesthesia: Epidural  Episiotomy: None Lacerations:  2nd degree perineal, R labial Suture Repair: 3.0 vicryl rapide Est. Blood Loss (mL):  400  Mom to postpartum.  Baby to Couplet care / Skin to Skin.  Bovard-Stuckert, Taleen Prosser 08/31/2014, 2:38 AM  Br/ Tdap in PNC/RI/ B neg/POP  D/w parents circumcision for female infant, inc r/b/a - wish to proceed - in office

## 2014-07-31 ENCOUNTER — Inpatient Hospital Stay (HOSPITAL_COMMUNITY)
Admission: AD | Admit: 2014-07-31 | Discharge: 2014-07-31 | Disposition: A | Discharge status: Home / Self Care | Payer: Medicaid Other | Source: Ambulatory Visit | Attending: Obstetrics and Gynecology | Admitting: Obstetrics and Gynecology

## 2014-07-31 ENCOUNTER — Encounter (HOSPITAL_COMMUNITY): Payer: Self-pay

## 2014-07-31 DIAGNOSIS — Z3A34 34 weeks gestation of pregnancy: Secondary | ICD-10-CM

## 2014-07-31 DIAGNOSIS — O212 Late vomiting of pregnancy: Secondary | ICD-10-CM | POA: Diagnosis present

## 2014-07-31 DIAGNOSIS — O4703 False labor before 37 completed weeks of gestation, third trimester: Secondary | ICD-10-CM

## 2014-07-31 DIAGNOSIS — A084 Viral intestinal infection, unspecified: Secondary | ICD-10-CM

## 2014-07-31 DIAGNOSIS — Z3A35 35 weeks gestation of pregnancy: Secondary | ICD-10-CM | POA: Insufficient documentation

## 2014-07-31 LAB — URINE MICROSCOPIC-ADD ON

## 2014-07-31 LAB — URINALYSIS, ROUTINE W REFLEX MICROSCOPIC
BILIRUBIN URINE: NEGATIVE
Glucose, UA: NEGATIVE mg/dL
Hgb urine dipstick: NEGATIVE
Ketones, ur: NEGATIVE mg/dL
Nitrite: NEGATIVE
PROTEIN: NEGATIVE mg/dL
Specific Gravity, Urine: 1.01 (ref 1.005–1.030)
UROBILINOGEN UA: 0.2 mg/dL (ref 0.0–1.0)
pH: 7 (ref 5.0–8.0)

## 2014-07-31 MED ORDER — BUTALBITAL-APAP-CAFFEINE 50-325-40 MG PO TABS
1.0000 | ORAL_TABLET | Freq: Once | ORAL | Status: AC
  Administered 2014-07-31: 1 via ORAL
  Filled 2014-07-31: qty 1

## 2014-07-31 MED ORDER — LACTATED RINGERS IV BOLUS (SEPSIS)
1000.0000 mL | Freq: Once | INTRAVENOUS | Status: AC
  Administered 2014-07-31: 1000 mL via INTRAVENOUS

## 2014-07-31 MED ORDER — ONDANSETRON HCL 4 MG/2ML IJ SOLN
4.0000 mg | Freq: Once | INTRAMUSCULAR | Status: AC
  Administered 2014-07-31: 4 mg via INTRAVENOUS
  Filled 2014-07-31: qty 2

## 2014-07-31 NOTE — MAU Note (Signed)
Pt presents complaining of nausea and vomiting since Wednesday night. Unable to keep anything down. Pt has thick vaginal discharge. Denies bleeding. Pt also having some cramps. Reports good fetal movement.

## 2014-07-31 NOTE — Discharge Instructions (Signed)
Braxton Hicks Contractions °Contractions of the uterus can occur throughout pregnancy. Contractions are not always a sign that you are in labor.  °WHAT ARE BRAXTON HICKS CONTRACTIONS?  °Contractions that occur before labor are called Braxton Hicks contractions, or false labor. Toward the end of pregnancy (32-34 weeks), these contractions can develop more often and may become more forceful. This is not true labor because these contractions do not result in opening (dilatation) and thinning of the cervix. They are sometimes difficult to tell apart from true labor because these contractions can be forceful and people have different pain tolerances. You should not feel embarrassed if you go to the hospital with false labor. Sometimes, the only way to tell if you are in true labor is for your health care provider to look for changes in the cervix. °If there are no prenatal problems or other health problems associated with the pregnancy, it is completely safe to be sent home with false labor and await the onset of true labor. °HOW CAN YOU TELL THE DIFFERENCE BETWEEN TRUE AND FALSE LABOR? °False Labor °· The contractions of false labor are usually shorter and not as hard as those of true labor.   °· The contractions are usually irregular.   °· The contractions are often felt in the front of the lower abdomen and in the groin.   °· The contractions may go away when you walk around or change positions while lying down.   °· The contractions get weaker and are shorter lasting as time goes on.   °· The contractions do not usually become progressively stronger, regular, and closer together as with true labor.   °True Labor °· Contractions in true labor last 30-70 seconds, become very regular, usually become more intense, and increase in frequency.   °· The contractions do not go away with walking.   °· The discomfort is usually felt in the top of the uterus and spreads to the lower abdomen and low back.   °· True labor can be  determined by your health care provider with an exam. This will show that the cervix is dilating and getting thinner.   °WHAT TO REMEMBER °· Keep up with your usual exercises and follow other instructions given by your health care provider.   °· Take medicines as directed by your health care provider.   °· Keep your regular prenatal appointments.   °· Eat and drink lightly if you think you are going into labor.   °· If Braxton Hicks contractions are making you uncomfortable:   °· Change your position from lying down or resting to walking, or from walking to resting.   °· Sit and rest in a tub of warm water.   °· Drink 2-3 glasses of water. Dehydration may cause these contractions.   °· Do slow and deep breathing several times an hour.   °WHEN SHOULD I SEEK IMMEDIATE MEDICAL CARE? °Seek immediate medical care if: °· Your contractions become stronger, more regular, and closer together.   °· You have fluid leaking or gushing from your vagina.   °· You have a fever.   °· You pass blood-tinged mucus.   °· You have vaginal bleeding.   °· You have continuous abdominal pain.   °· You have low back pain that you never had before.   °· You feel your baby's head pushing down and causing pelvic pressure.   °· Your baby is not moving as much as it used to.   °Document Released: 05/08/2005 Document Revised: 05/13/2013 Document Reviewed: 02/17/2013 °ExitCare® Patient Information ©2015 [employer]. This information is not intended to replace advice given to you by your health care   provider. Make sure you discuss any questions you have with your health care provider. °Viral Gastroenteritis °Viral gastroenteritis is also known as stomach flu. This condition affects the stomach and intestinal tract. It can cause sudden diarrhea and vomiting. The illness typically lasts 3 to 8 days. Most people develop an immune response that eventually gets rid of the virus. While this natural response develops, the virus can make you quite  ill. °CAUSES  °Many different viruses can cause gastroenteritis, such as rotavirus or noroviruses. You can catch one of these viruses by consuming contaminated food or water. You may also catch a virus by sharing utensils or other personal items with an infected person or by touching a contaminated surface. °SYMPTOMS  °The most common symptoms are diarrhea and vomiting. These problems can cause a severe loss of body fluids (dehydration) and a body salt (electrolyte) imbalance. Other symptoms may include: °· Fever. °· Headache. °· Fatigue. °· Abdominal pain. °DIAGNOSIS  °Your caregiver can usually diagnose viral gastroenteritis based on your symptoms and a physical exam. A stool sample may also be taken to test for the presence of viruses or other infections. °TREATMENT  °This illness typically goes away on its own. Treatments are aimed at rehydration. The most serious cases of viral gastroenteritis involve vomiting so severely that you are not able to keep fluids down. In these cases, fluids must be given through an intravenous line (IV). °HOME CARE INSTRUCTIONS  °· Drink enough fluids to keep your urine clear or pale yellow. Drink small amounts of fluids frequently and increase the amounts as tolerated. °· Ask your caregiver for specific rehydration instructions. °· Avoid: °¨ Foods high in sugar. °¨ Alcohol. °¨ Carbonated drinks. °¨ Tobacco. °¨ Juice. °¨ Caffeine drinks. °¨ Extremely hot or cold fluids. °¨ Fatty, greasy foods. °¨ Too much intake of anything at one time. °¨ Dairy products until 24 to 48 hours after diarrhea stops. °· You may consume probiotics. Probiotics are active cultures of beneficial bacteria. They may lessen the amount and number of diarrheal stools in adults. Probiotics can be found in yogurt with active cultures and in supplements. °· Wash your hands well to avoid spreading the virus. °· Only take over-the-counter or prescription medicines for pain, discomfort, or fever as directed by your  caregiver. Do not give aspirin to children. Antidiarrheal medicines are not recommended. °· Ask your caregiver if you should continue to take your regular prescribed and over-the-counter medicines. °· Keep all follow-up appointments as directed by your caregiver. °SEEK IMMEDIATE MEDICAL CARE IF:  °· You are unable to keep fluids down. °· You do not urinate at least once every 6 to 8 hours. °· You develop shortness of breath. °· You notice blood in your stool or vomit. This may look like coffee grounds. °· You have abdominal pain that increases or is concentrated in one small area (localized). °· You have persistent vomiting or diarrhea. °· You have a fever. °· The patient is a child younger than 3 months, and he or she has a fever. °· The patient is a child older than 3 months, and he or she has a fever and persistent symptoms. °· The patient is a child older than 3 months, and he or she has a fever and symptoms suddenly get worse. °· The patient is a baby, and he or she has no tears when crying. °MAKE SURE YOU:  °· Understand these instructions. °· Will watch your condition. °· Will get help right away if you are   not doing well or get worse. Document Released: 05/08/2005 Document Revised: 07/31/2011 Document Reviewed: 02/22/2011 [facility] Patient Information 2015 [place], [place]. This information is not intended to replace advice given to you by your health care provider. Make sure you discuss any questions you have with your health care provider.

## 2014-07-31 NOTE — MAU Provider Note (Signed)
History     CSN: [id number]  Arrival date and time: 07/31/14 1252   First Provider Initiated Contact with Patient 07/31/14 1408      Chief Complaint  Patient presents with  . Nausea  . Emesis   HPI  Ms. [name] is a 24 y.o. G1P0 at [redacted]w[redacted]d how presents to MAU today with complaint of N/V since Wednesday and contractions. She denies diarrhea or loose stools. She denies sick contacts, vaginal bleeding or LOF. She states contractions started earlier today q 30 minutes. She states less fetal movement x 1 week.   OB History    Gravida Para Term Preterm AB TAB SAB Ectopic Multiple Living   1         0      Past Medical History  Diagnosis Date  . Chest pain, unspecified   . Dyspnea   . Hyperventilation   . Panic attack   . Anxiety and depression   . Malaise and fatigue   . Allergy   . Panic attack   . Anxiety   . Gluten intolerance   . IBS (irritable bowel syndrome)   . Migraine     Past Surgical History  Procedure Laterality Date  . Tonsillectomy    . Adenoidectomy    . Wisdom tooth extraction      Family History  Problem Relation Age of Onset  . Hypertension Mother   . Cancer Mother   . Heart disease Maternal Grandfather     History  Substance Use Topics  . Smoking status: Never Smoker   . Smokeless tobacco: Never Used  . Alcohol Use: No    Allergies:  Allergies  Allergen Reactions  . Sulfa Antibiotics Rash    Prescriptions prior to admission  Medication Sig Dispense Refill Last Dose  . diphenhydrAMINE (BENADRYL) 25 MG tablet Take 25 mg by mouth at bedtime as needed for sleep.   Past Week at Unknown time  . Doxylamine-Pyridoxine 10-10 MG TBEC Take 2 tablets by mouth at bedtime.   07/30/2014 at Unknown time  . Prenatal Vit-Min-FA-Fish Oil ([facility] PRENATAL GUMMY) 0.4-113.5 MG CHEW Chew 2 tablets by mouth daily.   Past Week at Unknown time  . ranitidine (ZANTAC) 150 MG tablet Take 150 mg by mouth 2 (two) times daily.   07/30/2014 at Unknown time     Review of Systems  Constitutional: Negative for fever and malaise/fatigue.  Gastrointestinal: Positive for nausea and vomiting. Negative for diarrhea and constipation.  Genitourinary: Negative for dysuria, urgency and frequency.       Neg - vaginal bleeding, discharge   Physical Exam   Blood pressure 129/90, pulse 91, temperature 98 F (36.7 C), temperature source Oral, resp. rate 18, height  (1.626 m), weight 155 lb (70.308 kg), last menstrual period 11/26/2013.  Physical Exam  Constitutional: She is oriented to person, place, and time. She appears well-developed and well-nourished. No distress.  HENT:  Head: Normocephalic.  Cardiovascular: Normal rate.   Respiratory: Effort normal.  GI: Soft. She exhibits no distension and no mass. There is no tenderness. There is no rebound and no guarding.  Neurological: She is alert and oriented to person, place, and time.  Skin: Skin is warm and dry. No erythema.  Psychiatric: She has a normal mood and affect.  Dilation: Closed Station: -2 Exam by:: Morrison Old RN  Results for orders placed or performed during the hospital encounter of 07/31/14 (from the past 24 hour(s))  Urinalysis, Routine w reflex microscopic  Status: Abnormal   Collection Time: 07/31/14  1:45 PM  Result Value Ref Range   Color, Urine YELLOW YELLOW   APPearance HAZY (A) CLEAR   Specific Gravity, Urine 1.010 1.005 - 1.030   pH 7.0 5.0 - 8.0   Glucose, UA NEGATIVE NEGATIVE mg/dL   Hgb urine dipstick NEGATIVE NEGATIVE   Bilirubin Urine NEGATIVE NEGATIVE   Ketones, ur NEGATIVE NEGATIVE mg/dL   Protein, ur NEGATIVE NEGATIVE mg/dL   Urobilinogen, UA 0.2 0.0 - 1.0 mg/dL   Nitrite NEGATIVE NEGATIVE   Leukocytes, UA SMALL (A) NEGATIVE  Urine microscopic-add on     Status: Abnormal   Collection Time: 07/31/14  1:45 PM  Result Value Ref Range   Squamous Epithelial / LPF FEW (A) RARE   WBC, UA 3-6 <3 WBC/hpf   Bacteria, UA MANY (A) RARE   Urine-Other MUCOUS  PRESENT    Fetal Monitoring: Baseline: 130 bpm, moderate variability, + accelerations, one variable deceleration noted Contractions: q 2-6 minutes  MAU Course  Procedures None  MDM UA today shows no dehydration IV LR bolus given with 4 mg IV Zofran Patient states resolution of N/V. No emesis while in MAU.  Complaint of headache. Fioricet given.  Discussed patient with Dr. Ellyn HackBovard. Agrees with plan to give additional liter of fluid and re-check cervix due to frequency of contractions.  Re-check of cervix - still closed. Patient appears more uncomfortable.  Discussed patient presentation and update with Dr. Ellyn HackBovard. Ok for discharge with labor precautions Assessment and Plan  A: SIUP at 5329w2d Preterm contractions  P: Discharge home Tylenol advised PRN for pain Preterm labor precautions discussed Patient advised to [facility] as scheduled for routine prenatal care or sooner PRN Patient may return to MAU as needed or if her condition were to change or worsen   Marny LowensteinJulie N Aishi Courts, PA-C  07/31/2014, 5:51 PM

## 2014-08-17 ENCOUNTER — Inpatient Hospital Stay (HOSPITAL_COMMUNITY)
Admission: AD | Admit: 2014-08-17 | Discharge: 2014-08-17 | Disposition: A | Discharge status: Home / Self Care | Payer: Medicaid Other | Source: Ambulatory Visit | Attending: Obstetrics and Gynecology | Admitting: Obstetrics and Gynecology

## 2014-08-17 ENCOUNTER — Inpatient Hospital Stay (HOSPITAL_COMMUNITY): Payer: Medicaid Other

## 2014-08-17 ENCOUNTER — Encounter (HOSPITAL_COMMUNITY): Payer: Self-pay

## 2014-08-17 DIAGNOSIS — O9989 Other specified diseases and conditions complicating pregnancy, childbirth and the puerperium: Secondary | ICD-10-CM | POA: Insufficient documentation

## 2014-08-17 DIAGNOSIS — Z3A38 38 weeks gestation of pregnancy: Secondary | ICD-10-CM | POA: Insufficient documentation

## 2014-08-17 DIAGNOSIS — Z3A37 37 weeks gestation of pregnancy: Secondary | ICD-10-CM | POA: Insufficient documentation

## 2014-08-17 DIAGNOSIS — IMO0002 Reserved for concepts with insufficient information to code with codable children: Secondary | ICD-10-CM | POA: Insufficient documentation

## 2014-08-17 DIAGNOSIS — R51 Headache: Secondary | ICD-10-CM | POA: Diagnosis present

## 2014-08-17 LAB — CBC
HCT: 29.9 % — ABNORMAL LOW (ref 36.0–46.0)
Hemoglobin: 10.5 g/dL — ABNORMAL LOW (ref 12.0–15.0)
MCH: 31.5 pg (ref 26.0–34.0)
MCHC: 35.1 g/dL (ref 30.0–36.0)
MCV: 89.8 fL (ref 78.0–100.0)
Platelets: 136 10*3/uL — ABNORMAL LOW (ref 150–400)
RBC: 3.33 MIL/uL — AB (ref 3.87–5.11)
RDW: 13.1 % (ref 11.5–15.5)
WBC: 5.6 10*3/uL (ref 4.0–10.5)

## 2014-08-17 LAB — COMPREHENSIVE METABOLIC PANEL
ALT: 11 U/L (ref 0–35)
ANION GAP: 8 (ref 5–15)
AST: 18 U/L (ref 0–37)
Albumin: 2.8 g/dL — ABNORMAL LOW (ref 3.5–5.2)
Alkaline Phosphatase: 174 U/L — ABNORMAL HIGH (ref 39–117)
BILIRUBIN TOTAL: 0.1 mg/dL — AB (ref 0.3–1.2)
BUN: 6 mg/dL (ref 6–23)
CHLORIDE: 106 mmol/L (ref 96–112)
CO2: 21 mmol/L (ref 19–32)
Calcium: 8.7 mg/dL (ref 8.4–10.5)
Creatinine, Ser: 0.64 mg/dL (ref 0.50–1.10)
GFR calc non Af Amer: >90 mL/min (ref >90)
GLUCOSE: 87 mg/dL (ref 70–99)
POTASSIUM: 3.7 mmol/L (ref 3.5–5.1)
Sodium: 135 mmol/L (ref 135–145)
TOTAL PROTEIN: 6.2 g/dL (ref 6.0–8.3)

## 2014-08-17 LAB — PROTEIN / CREATININE RATIO, URINE
Creatinine, Urine: 89 mg/dL
PROTEIN CREATININE RATIO: 0.11 (ref 0.00–0.15)
Total Protein, Urine: 10 mg/dL

## 2014-08-17 LAB — RAPID URINE DRUG SCREEN, HOSP PERFORMED
Amphetamines: NOT DETECTED
Barbiturates: NOT DETECTED
Benzodiazepines: NOT DETECTED
Cocaine: NOT DETECTED
OPIATES: NOT DETECTED
Tetrahydrocannabinol: NOT DETECTED

## 2014-08-17 LAB — URINE MICROSCOPIC-ADD ON

## 2014-08-17 LAB — URINALYSIS, ROUTINE W REFLEX MICROSCOPIC
BILIRUBIN URINE: NEGATIVE
Glucose, UA: NEGATIVE mg/dL
KETONES UR: NEGATIVE mg/dL
Nitrite: NEGATIVE
Protein, ur: NEGATIVE mg/dL
SPECIFIC GRAVITY, URINE: 1.01 (ref 1.005–1.030)
UROBILINOGEN UA: 0.2 mg/dL (ref 0.0–1.0)
pH: 7.5 (ref 5.0–8.0)

## 2014-08-17 MED ORDER — LACTATED RINGERS IV SOLN
INTRAVENOUS | Status: DC
  Administered 2014-08-17: 18:00:00 via INTRAVENOUS

## 2014-08-17 MED ORDER — OXYCODONE-ACETAMINOPHEN 5-325 MG PO TABS
2.0000 | ORAL_TABLET | Freq: Once | ORAL | Status: AC
  Administered 2014-08-17: 2 via ORAL
  Filled 2014-08-17: qty 2

## 2014-08-17 NOTE — MAU Note (Signed)
Pt presents complaining of a headache that started this morning. States she took a tyelnol and it helped this am but has not helped this afternoon. She gets fioriect in MAU and that works best. Also contracting every 10-15 minutes. Denies vaginal bleeding or leaking. Reports good fetal movement.

## 2014-08-17 NOTE — Discharge Instructions (Signed)
Fetal Movement Counts °Patient Name: __________________________________________________ Patient Due Date: ____________________ °Performing a fetal movement count is highly recommended in high-risk pregnancies, but it is good for every pregnant woman to do. Your health care provider may ask you to start counting fetal movements at 28 weeks of the pregnancy. Fetal movements often increase: °· After eating a full meal. °· After physical activity. °· After eating or drinking something sweet or cold. °· At rest. °Pay attention to when you feel the baby is most active. This will help you notice a pattern of your baby's sleep and wake cycles and what factors contribute to an increase in fetal movement. It is important to perform a fetal movement count at the same time each day when your baby is normally most active.  °HOW TO COUNT FETAL MOVEMENTS °1. Find a quiet and comfortable area to sit or lie down on your left side. Lying on your left side provides the best blood and oxygen circulation to your baby. °2. Write down the day and time on a sheet of paper or in a journal. °3. Start counting kicks, flutters, swishes, rolls, or jabs in a 2-hour period. You should feel at least 10 movements within 2 hours. °4. If you do not feel 10 movements in 2 hours, wait 2-3 hours and count again. Look for a change in the pattern or not enough counts in 2 hours. °SEEK MEDICAL CARE IF: °· You feel less than 10 counts in 2 hours, tried twice. °· There is no movement in over an hour. °· The pattern is changing or taking longer each day to reach 10 counts in 2 hours. °· You feel the baby is not moving as he or she usually does. °Date: ____________ Movements: ____________ Start time: ____________ Finish time: ____________  °Date: ____________ Movements: ____________ Start time: ____________ Finish time: ____________ °Date: ____________ Movements: ____________ Start time: ____________ Finish time: ____________ °Date: ____________ Movements:  ____________ Start time: ____________ Finish time: ____________ °Date: ____________ Movements: ____________ Start time: ____________ Finish time: ____________ °Date: ____________ Movements: ____________ Start time: ____________ Finish time: ____________ °Date: ____________ Movements: ____________ Start time: ____________ Finish time: ____________ °Date: ____________ Movements: ____________ Start time: ____________ Finish time: ____________  °Date: ____________ Movements: ____________ Start time: ____________ Finish time: ____________ °Date: ____________ Movements: ____________ Start time: ____________ Finish time: ____________ °Date: ____________ Movements: ____________ Start time: ____________ Finish time: ____________ °Date: ____________ Movements: ____________ Start time: ____________ Finish time: ____________ °Date: ____________ Movements: ____________ Start time: ____________ Finish time: ____________ °Date: ____________ Movements: ____________ Start time: ____________ Finish time: ____________ °Date: ____________ Movements: ____________ Start time: ____________ Finish time: ____________  °Date: ____________ Movements: ____________ Start time: ____________ Finish time: ____________ °Date: ____________ Movements: ____________ Start time: ____________ Finish time: ____________ °Date: ____________ Movements: ____________ Start time: ____________ Finish time: ____________ °Date: ____________ Movements: ____________ Start time: ____________ Finish time: ____________ °Date: ____________ Movements: ____________ Start time: ____________ Finish time: ____________ °Date: ____________ Movements: ____________ Start time: ____________ Finish time: ____________ °Date: ____________ Movements: ____________ Start time: ____________ Finish time: ____________  °Date: ____________ Movements: ____________ Start time: ____________ Finish time: ____________ °Date: ____________ Movements: ____________ Start time: ____________ Finish  time: ____________ °Date: ____________ Movements: ____________ Start time: ____________ Finish time: ____________ °Date: ____________ Movements: ____________ Start time: ____________ Finish time: ____________ °Date: ____________ Movements: ____________ Start time: ____________ Finish time: ____________ °Date: ____________ Movements: ____________ Start time: ____________ Finish time: ____________ °Date: ____________ Movements: ____________ Start time: ____________ Finish time: ____________  °Date: ____________ Movements: ____________ Start time: ____________ Finish   time: ____________ °Date: ____________ Movements: ____________ Start time: ____________ Finish time: ____________ °Date: ____________ Movements: ____________ Start time: ____________ Finish time: ____________ °Date: ____________ Movements: ____________ Start time: ____________ Finish time: ____________ °Date: ____________ Movements: ____________ Start time: ____________ Finish time: ____________ °Date: ____________ Movements: ____________ Start time: ____________ Finish time: ____________ °Date: ____________ Movements: ____________ Start time: ____________ Finish time: ____________  °Date: ____________ Movements: ____________ Start time: ____________ Finish time: ____________ °Date: ____________ Movements: ____________ Start time: ____________ Finish time: ____________ °Date: ____________ Movements: ____________ Start time: ____________ Finish time: ____________ °Date: ____________ Movements: ____________ Start time: ____________ Finish time: ____________ °Date: ____________ Movements: ____________ Start time: ____________ Finish time: ____________ °Date: ____________ Movements: ____________ Start time: ____________ Finish time: ____________ °Date: ____________ Movements: ____________ Start time: ____________ Finish time: ____________  °Date: ____________ Movements: ____________ Start time: ____________ Finish time: ____________ °Date: ____________  Movements: ____________ Start time: ____________ Finish time: ____________ °Date: ____________ Movements: ____________ Start time: ____________ Finish time: ____________ °Date: ____________ Movements: ____________ Start time: ____________ Finish time: ____________ °Date: ____________ Movements: ____________ Start time: ____________ Finish time: ____________ °Date: ____________ Movements: ____________ Start time: ____________ Finish time: ____________ °Date: ____________ Movements: ____________ Start time: ____________ Finish time: ____________  °Date: ____________ Movements: ____________ Start time: ____________ Finish time: ____________ °Date: ____________ Movements: ____________ Start time: ____________ Finish time: ____________ °Date: ____________ Movements: ____________ Start time: ____________ Finish time: ____________ °Date: ____________ Movements: ____________ Start time: ____________ Finish time: ____________ °Date: ____________ Movements: ____________ Start time: ____________ Finish time: ____________ °Date: ____________ Movements: ____________ Start time: ____________ Finish time: ____________ °Document Released: 06/07/2006 Document Revised: 09/22/2013 Document Reviewed: 03/04/2012 °[employer]® Patient Information ©2015 [employer]. This information is not intended to replace advice given to you by your health care provider. Make sure you discuss any questions you have with your health care provider. °Braxton Hicks Contractions °Contractions of the uterus can occur throughout pregnancy. Contractions are not always a sign that you are in labor.  °WHAT ARE BRAXTON HICKS CONTRACTIONS?  °Contractions that occur before labor are called Braxton Hicks contractions, or false labor. Toward the end of pregnancy (32-34 weeks), these contractions can develop more often and may become more forceful. This is not true labor because these contractions do not result in opening (dilatation) and thinning of the cervix. They  are sometimes difficult to tell apart from true labor because these contractions can be forceful and people have different pain tolerances. You should not feel embarrassed if you go to the hospital with false labor. Sometimes, the only way to tell if you are in true labor is for your health care provider to look for changes in the cervix. °If there are no prenatal problems or other health problems associated with the pregnancy, it is completely safe to be sent home with false labor and await the onset of true labor. °HOW CAN YOU TELL THE DIFFERENCE BETWEEN TRUE AND FALSE LABOR? °False Labor °· The contractions of false labor are usually shorter and not as hard as those of true labor.   °· The contractions are usually irregular.   °· The contractions are often felt in the front of the lower abdomen and in the groin.   °· The contractions may go away when you walk around or change positions while lying down.   °· The contractions get weaker and are shorter lasting as time goes on.   °· The contractions do not usually become progressively stronger, regular, and closer together as with true labor.   °True Labor °5. Contractions in true labor last 30-70 seconds, become   very regular, usually become more intense, and increase in frequency.   °6. The contractions do not go away with walking.   °7. The discomfort is usually felt in the top of the uterus and spreads to the lower abdomen and low back.   °8. True labor can be determined by your health care provider with an exam. This will show that the cervix is dilating and getting thinner.   °WHAT TO REMEMBER °· Keep up with your usual exercises and follow other instructions given by your health care provider.   °· Take medicines as directed by your health care provider.   °· Keep your regular prenatal appointments.   °· Eat and drink lightly if you think you are going into labor.   °· If Braxton Hicks contractions are making you uncomfortable:   °· Change your position from  lying down or resting to walking, or from walking to resting.   °· Sit and rest in a tub of warm water.   °· Drink 2-3 glasses of water. Dehydration may cause these contractions.   °· Do slow and deep breathing several times an hour.   °WHEN SHOULD I SEEK IMMEDIATE MEDICAL CARE? °Seek immediate medical care if: °· Your contractions become stronger, more regular, and closer together.   °· You have fluid leaking or gushing from your vagina.   °· You have a fever.   °· You pass blood-tinged mucus.   °· You have vaginal bleeding.   °· You have continuous abdominal pain.   °· You have low back pain that you never had before.   °· You feel your baby's head pushing down and causing pelvic pressure.   °· Your baby is not moving as much as it used to.   °Document Released: 05/08/2005 Document Revised: 05/13/2013 Document Reviewed: 02/17/2013 °[employer]® Patient Information ©2015 [employer]. This information is not intended to replace advice given to you by your health care provider. Make sure you discuss any questions you have with your health care provider. ° °

## 2014-08-17 NOTE — MAU Provider Note (Signed)
History   G1 at 38 wks in witrh c/o miagraine headache that started at 0800 this morning and has progressively gotten worse during course of day. Has hx of miagraine headaches.  CSN: [id number]  Arrival date and time: 08/17/14 1725   First Provider Initiated Contact with Patient 08/17/14 1750      Chief Complaint  Patient presents with  . Headache   HPI  OB History    Gravida Para Term Preterm AB TAB SAB Ectopic Multiple Living   1         0      Past Medical History  Diagnosis Date  . Chest pain, unspecified   . Dyspnea   . Hyperventilation   . Panic attack   . Anxiety and depression   . Malaise and fatigue   . Allergy   . Panic attack   . Anxiety   . Gluten intolerance   . IBS (irritable bowel syndrome)   . Migraine     Past Surgical History  Procedure Laterality Date  . Tonsillectomy    . Adenoidectomy    . Wisdom tooth extraction      Family History  Problem Relation Age of Onset  . Hypertension Mother   . Cancer Mother   . Heart disease Maternal Grandfather     History  Substance Use Topics  . Smoking status: Never Smoker   . Smokeless tobacco: Never Used  . Alcohol Use: No    Allergies:  Allergies  Allergen Reactions  . Sulfa Antibiotics Rash    Prescriptions prior to admission  Medication Sig Dispense Refill Last Dose  . acetaminophen (TYLENOL) 500 MG tablet Take 1,000 mg by mouth every 6 (six) hours as needed for moderate pain.   08/17/2014 at Unknown time  . calcium carbonate (TUMS - DOSED IN MG ELEMENTAL CALCIUM) 500 MG chewable tablet Chew 2 tablets by mouth 3 (three) times daily as needed for indigestion or heartburn.   08/17/2014 at Unknown time  . diphenhydrAMINE (BENADRYL) 25 MG tablet Take 25 mg by mouth at bedtime as needed for sleep.   Past Week at Unknown time  . Doxylamine-Pyridoxine 10-10 MG TBEC Take 2 tablets by mouth at bedtime.   08/16/2014 at Unknown time  . Prenatal Vit-Min-FA-Fish Oil ([employer] PRENATAL GUMMY) 0.4-113.5 MG  CHEW Chew 2 tablets by mouth daily.   Past Week at Unknown time  . ranitidine (ZANTAC) 150 MG tablet Take 150 mg by mouth 2 (two) times daily.   Past Week at Unknown time    Review of Systems  Constitutional: Negative.   Eyes: Negative.   Respiratory: Negative.   Cardiovascular: Negative.   Gastrointestinal: Negative.   Genitourinary: Negative.   Skin: Negative.   Neurological: Positive for headaches.  Endo/Heme/Allergies: Negative.   Psychiatric/Behavioral: Negative.    Physical Exam   Last menstrual period 11/26/2013.  Physical Exam  Constitutional: She is oriented to person, place, and time. She appears well-developed and well-nourished.  HENT:  Head: Normocephalic.  Eyes: Pupils are equal, round, and reactive to light.  Neck: Normal range of motion.  Cardiovascular: Normal rate, normal heart sounds and intact distal pulses.   Respiratory: Effort normal and breath sounds normal.  GI: Soft. Bowel sounds are normal.  Genitourinary: Uterus normal.  Musculoskeletal: Normal range of motion.  Neurological: She is alert and oriented to person, place, and time. She has normal reflexes.  Skin: Skin is warm and dry.  Psychiatric: She has a normal mood and affect. Her behavior is  normal. Judgment and thought content normal.    MAU Course  Procedures  MDM Decreased fetal heart rate pattern no decels, will get BPP and will manage pain.  Assessment and Plan  DTR's WNL, PIH labs drawn, will get bedside BPP and consult Dr. Altamese Dilling, Lisa Blakeman DARLENE 08/17/2014, 5:54 PM

## 2014-08-17 NOTE — ED Provider Notes (Signed)
O: pt status report to Dr. Ambrose MantleHenley. Notified of flat variability and fetal tachycardia to 180's. A: Preg at 37.6, miagraine headache, BPP 8/8, fetal tachycardia and decreased variability. P Dr. Ambrose MantleHenley to assess pt.

## 2014-08-18 DIAGNOSIS — IMO0002 Reserved for concepts with insufficient information to code with codable children: Secondary | ICD-10-CM | POA: Insufficient documentation

## 2014-08-18 DIAGNOSIS — Z3A37 37 weeks gestation of pregnancy: Secondary | ICD-10-CM | POA: Insufficient documentation

## 2014-08-23 ENCOUNTER — Inpatient Hospital Stay (HOSPITAL_COMMUNITY)
Admission: AD | Admit: 2014-08-23 | Discharge: 2014-08-24 | Disposition: A | Discharge status: Home / Self Care | Payer: Medicaid Other | Source: Ambulatory Visit | Attending: Obstetrics and Gynecology | Admitting: Obstetrics and Gynecology

## 2014-08-23 ENCOUNTER — Encounter (HOSPITAL_COMMUNITY): Payer: Self-pay

## 2014-08-23 DIAGNOSIS — O133 Gestational [pregnancy-induced] hypertension without significant proteinuria, third trimester: Secondary | ICD-10-CM | POA: Diagnosis not present

## 2014-08-23 DIAGNOSIS — Z3A39 39 weeks gestation of pregnancy: Secondary | ICD-10-CM | POA: Insufficient documentation

## 2014-08-23 DIAGNOSIS — R030 Elevated blood-pressure reading, without diagnosis of hypertension: Secondary | ICD-10-CM | POA: Diagnosis present

## 2014-08-23 LAB — COMPREHENSIVE METABOLIC PANEL
ALT: 12 U/L (ref 0–35)
AST: 19 U/L (ref 0–37)
Albumin: 2.8 g/dL — ABNORMAL LOW (ref 3.5–5.2)
Alkaline Phosphatase: 187 U/L — ABNORMAL HIGH (ref 39–117)
Anion gap: 7 (ref 5–15)
BILIRUBIN TOTAL: 0.5 mg/dL (ref 0.3–1.2)
BUN: 10 mg/dL (ref 6–23)
CALCIUM: 9.1 mg/dL (ref 8.4–10.5)
CHLORIDE: 104 mmol/L (ref 96–112)
CO2: 25 mmol/L (ref 19–32)
Creatinine, Ser: 0.7 mg/dL (ref 0.50–1.10)
GFR calc Af Amer: >90 mL/min (ref >90)
GFR calc non Af Amer: >90 mL/min (ref >90)
Glucose, Bld: 87 mg/dL (ref 70–99)
Potassium: 4.5 mmol/L (ref 3.5–5.1)
Sodium: 136 mmol/L (ref 135–145)
TOTAL PROTEIN: 6.3 g/dL (ref 6.0–8.3)

## 2014-08-23 LAB — URINALYSIS, ROUTINE W REFLEX MICROSCOPIC
Bilirubin Urine: NEGATIVE
Glucose, UA: NEGATIVE mg/dL
Hgb urine dipstick: NEGATIVE
Ketones, ur: NEGATIVE mg/dL
LEUKOCYTES UA: NEGATIVE
Nitrite: NEGATIVE
Protein, ur: NEGATIVE mg/dL
Specific Gravity, Urine: 1.015 (ref 1.005–1.030)
Urobilinogen, UA: 0.2 mg/dL (ref 0.0–1.0)
pH: 8 (ref 5.0–8.0)

## 2014-08-23 LAB — CBC
HCT: 30.7 % — ABNORMAL LOW (ref 36.0–46.0)
HEMOGLOBIN: 10.6 g/dL — AB (ref 12.0–15.0)
MCH: 31.4 pg (ref 26.0–34.0)
MCHC: 34.5 g/dL (ref 30.0–36.0)
MCV: 90.8 fL (ref 78.0–100.0)
PLATELETS: 151 10*3/uL (ref 150–400)
RBC: 3.38 MIL/uL — ABNORMAL LOW (ref 3.87–5.11)
RDW: 13.2 % (ref 11.5–15.5)
WBC: 6.6 10*3/uL (ref 4.0–10.5)

## 2014-08-23 LAB — PROTEIN / CREATININE RATIO, URINE
CREATININE, URINE: 59 mg/dL
PROTEIN CREATININE RATIO: 0.2 — AB (ref 0.00–0.15)
TOTAL PROTEIN, URINE: 12 mg/dL

## 2014-08-23 MED ORDER — CYCLOBENZAPRINE HCL 10 MG PO TABS
10.0000 mg | ORAL_TABLET | Freq: Once | ORAL | Status: AC
  Administered 2014-08-23: 10 mg via ORAL
  Filled 2014-08-23: qty 1

## 2014-08-23 MED ORDER — ONDANSETRON 8 MG PO TBDP
8.0000 mg | ORAL_TABLET | Freq: Once | ORAL | Status: AC
  Administered 2014-08-23: 8 mg via ORAL
  Filled 2014-08-23: qty 1

## 2014-08-23 NOTE — MAU Note (Signed)
Pt reports her B/P was 144/98 today at home, reports vomiting x 2 days. Also states she has had a headache off/on for 2 days.

## 2014-08-24 DIAGNOSIS — O133 Gestational [pregnancy-induced] hypertension without significant proteinuria, third trimester: Secondary | ICD-10-CM | POA: Diagnosis not present

## 2014-08-24 MED ORDER — CYCLOBENZAPRINE HCL 10 MG PO TABS: 10.0000 mg | ORAL_TABLET | Freq: Every day | ORAL | Status: DC

## 2014-08-24 MED ORDER — DOXYLAMINE-PYRIDOXINE 10-10 MG PO TBEC: 2.0000 | DELAYED_RELEASE_TABLET | Freq: Every day | ORAL | Status: DC

## 2014-08-24 NOTE — MAU Provider Note (Signed)
History     CSN: [id number]  Arrival date and time: 08/23/14 2118   First Provider Initiated Contact with Patient 08/23/14 2203      Chief Complaint  Patient presents with  . Emesis   HPI [name] is a 24 y.o. G1P0 at 5427w0d here with report of elevated blood pressure at home and vomiting that has returned since discontinuing diclegis.  Reports vomiting x 3 in past 24 hours.  Denies fever or recent illness.  Also reports headache.  Denies vision changes.    Past Medical History  Diagnosis Date  . Chest pain, unspecified   . Dyspnea   . Hyperventilation   . Panic attack   . Anxiety and depression   . Malaise and fatigue   . Allergy   . Panic attack   . Anxiety   . Gluten intolerance   . IBS (irritable bowel syndrome)   . Migraine     Past Surgical History  Procedure Laterality Date  . Tonsillectomy    . Adenoidectomy    . Wisdom tooth extraction      Family History  Problem Relation Age of Onset  . Hypertension Mother   . Cancer Mother   . Heart disease Maternal Grandfather     History  Substance Use Topics  . Smoking status: Never Smoker   . Smokeless tobacco: Never Used  . Alcohol Use: No    Allergies:  Allergies  Allergen Reactions  . Sulfa Antibiotics Rash    Prescriptions prior to admission  Medication Sig Dispense Refill Last Dose  . acetaminophen (TYLENOL) 500 MG tablet Take 1,000 mg by mouth every 6 (six) hours as needed for moderate pain.   08/17/2014 at Unknown time  . calcium carbonate (TUMS - DOSED IN MG ELEMENTAL CALCIUM) 500 MG chewable tablet Chew 2 tablets by mouth 3 (three) times daily as needed for indigestion or heartburn.   08/17/2014 at Unknown time  . diphenhydrAMINE (BENADRYL) 25 MG tablet Take 25 mg by mouth at bedtime as needed for sleep.   Past Week at Unknown time  . Doxylamine-Pyridoxine 10-10 MG TBEC Take 2 tablets by mouth at bedtime.   08/16/2014 at Unknown time  . Prenatal Vit-Min-FA-Fish Oil ([employer] PRENATAL GUMMY)  0.4-113.5 MG CHEW Chew 2 tablets by mouth daily.   Past Week at Unknown time  . ranitidine (ZANTAC) 150 MG tablet Take 150 mg by mouth 2 (two) times daily.   Past Week at Unknown time    Review of Systems  Constitutional: Negative for fever and chills.  Eyes: Negative for blurred vision and double vision.  Gastrointestinal: Positive for nausea and vomiting.  Neurological: Positive for headaches.   Physical Exam   Filed Vitals:   08/23/14 2138 08/23/14 2319 08/23/14 2335 08/24/14 0000  BP: 126/97 118/88 128/86 126/84  Pulse: 105   80  Temp: 98.1 F (36.7 C)     TempSrc: Oral     Resp: 18       Blood pressure 126/84, pulse 80, temperature 98.1 F (36.7 C), temperature source Oral, resp. rate 18, last menstrual period 11/26/2013.  Physical Exam  Constitutional: She is oriented to person, place, and time. She appears well-developed and well-nourished. No distress.  HENT:  Head: Normocephalic.  Eyes: Pupils are equal, round, and reactive to light.  Neck: Normal range of motion. Neck supple.  Cardiovascular: Normal rate, regular rhythm and normal heart sounds.   Respiratory: Effort normal and breath sounds normal. No respiratory distress.  GI:  Soft. There is no tenderness.  Genitourinary: No bleeding in the vagina.  Musculoskeletal: Normal range of motion. She exhibits edema (trace bilat).  Neurological: She is alert and oriented to person, place, and time. She has normal reflexes. She displays normal reflexes.  Skin: Skin is warm and dry.   Dilation: 1.5 Effacement (%): 50 Cervical Position: Posterior Presentation: Vertex Exam by:: Margarita Mail, CNM  MAU Course  Procedures  Results for orders placed or performed during the hospital encounter of 08/23/14 (from the past 24 hour(s))  Urinalysis, Routine w reflex microscopic     Status: None   Collection Time: 08/23/14  9:24 PM  Result Value Ref Range   Color, Urine YELLOW YELLOW   APPearance CLEAR CLEAR   Specific Gravity,  Urine 1.015 1.005 - 1.030   pH 8.0 5.0 - 8.0   Glucose, UA NEGATIVE NEGATIVE mg/dL   Hgb urine dipstick NEGATIVE NEGATIVE   Bilirubin Urine NEGATIVE NEGATIVE   Ketones, ur NEGATIVE NEGATIVE mg/dL   Protein, ur NEGATIVE NEGATIVE mg/dL   Urobilinogen, UA 0.2 0.0 - 1.0 mg/dL   Nitrite NEGATIVE NEGATIVE   Leukocytes, UA NEGATIVE NEGATIVE  Protein / creatinine ratio, urine     Status: Abnormal   Collection Time: 08/23/14  9:24 PM  Result Value Ref Range   Creatinine, Urine 59.00 mg/dL   Total Protein, Urine 12 mg/dL   Protein Creatinine Ratio 0.20 (H) 0.00 - 0.15  CBC     Status: Abnormal   Collection Time: 08/23/14 10:15 PM  Result Value Ref Range   WBC 6.6 4.0 - 10.5 K/uL   RBC 3.38 (L) 3.87 - 5.11 MIL/uL   Hemoglobin 10.6 (L) 12.0 - 15.0 g/dL   HCT 53.6 (L) 64.4 - 03.4 %   MCV 90.8 78.0 - 100.0 fL   MCH 31.4 26.0 - 34.0 pg   MCHC 34.5 30.0 - 36.0 g/dL   RDW 74.2 59.5 - 63.8 %   Platelets 151 150 - 400 K/uL  Comprehensive metabolic panel     Status: Abnormal   Collection Time: 08/23/14 10:15 PM  Result Value Ref Range   Sodium 136 135 - 145 mmol/L   Potassium 4.5 3.5 - 5.1 mmol/L   Chloride 104 96 - 112 mmol/L   CO2 25 19 - 32 mmol/L   Glucose, Bld 87 70 - 99 mg/dL   BUN 10 6 - 23 mg/dL   Creatinine, Ser 7.56 0.50 - 1.10 mg/dL   Calcium 9.1 8.4 - 43.3 mg/dL   Total Protein 6.3 6.0 - 8.3 g/dL   Albumin 2.8 (L) 3.5 - 5.2 g/dL   AST 19 0 - 37 U/L   ALT 12 0 - 35 U/L   Alkaline Phosphatase 187 (H) 39 - 117 U/L   Total Bilirubin 0.5 0.3 - 1.2 mg/dL   GFR calc non Af Amer >90 >90 mL/min   GFR calc Af Amer >90 >90 mL/min   Anion gap 7 5 - 15   Consulted with Dr. Ambrose Mantle > reviewed HPI/labs/fetal heart rate > discharge home, ensure patient is set-up for fetal testing 2x/week  Assessment and Plan  24 y.o. G1P0 at [redacted]w[redacted]d IUP Gestational Hypertension Reactive NST  Plan: Discharge to home Preeclampsia precautions Keep scheduled appt for Tuesday   Rochele Pages  N 08/24/2014, 12:22 AM

## 2014-08-24 NOTE — Discharge Instructions (Signed)

## 2014-08-30 ENCOUNTER — Inpatient Hospital Stay (HOSPITAL_COMMUNITY)
Admission: AD | Admit: 2014-08-30 | Discharge: 2014-09-02 | DRG: 774 | Disposition: A | Discharge status: Home / Self Care | Payer: Medicaid Other | Source: Ambulatory Visit | Attending: Obstetrics and Gynecology | Admitting: Obstetrics and Gynecology

## 2014-08-30 ENCOUNTER — Inpatient Hospital Stay (HOSPITAL_COMMUNITY): Payer: Medicaid Other | Admitting: Anesthesiology

## 2014-08-30 ENCOUNTER — Encounter (HOSPITAL_COMMUNITY): Payer: Self-pay | Admitting: *Deleted

## 2014-08-30 ENCOUNTER — Inpatient Hospital Stay (HOSPITAL_COMMUNITY)
Admission: AD | Admit: 2014-08-30 | Discharge: 2014-08-30 | Disposition: A | Discharge status: Home / Self Care | Payer: Medicaid Other | Source: Ambulatory Visit | Attending: Obstetrics and Gynecology | Admitting: Obstetrics and Gynecology

## 2014-08-30 DIAGNOSIS — Z3A39 39 weeks gestation of pregnancy: Secondary | ICD-10-CM | POA: Diagnosis present

## 2014-08-30 DIAGNOSIS — K900 Celiac disease: Secondary | ICD-10-CM | POA: Diagnosis present

## 2014-08-30 DIAGNOSIS — F419 Anxiety disorder, unspecified: Secondary | ICD-10-CM | POA: Diagnosis present

## 2014-08-30 DIAGNOSIS — O9962 Diseases of the digestive system complicating childbirth: Secondary | ICD-10-CM | POA: Diagnosis present

## 2014-08-30 DIAGNOSIS — F329 Major depressive disorder, single episode, unspecified: Secondary | ICD-10-CM | POA: Diagnosis present

## 2014-08-30 DIAGNOSIS — N858 Other specified noninflammatory disorders of uterus: Secondary | ICD-10-CM | POA: Diagnosis present

## 2014-08-30 DIAGNOSIS — K589 Irritable bowel syndrome without diarrhea: Secondary | ICD-10-CM | POA: Diagnosis present

## 2014-08-30 DIAGNOSIS — O99344 Other mental disorders complicating childbirth: Secondary | ICD-10-CM | POA: Diagnosis present

## 2014-08-30 DIAGNOSIS — O9832 Other infections with a predominantly sexual mode of transmission complicating childbirth: Secondary | ICD-10-CM | POA: Diagnosis present

## 2014-08-30 DIAGNOSIS — A630 Anogenital (venereal) warts: Secondary | ICD-10-CM | POA: Diagnosis present

## 2014-08-30 HISTORY — DX: Gestational (pregnancy-induced) hypertension without significant proteinuria, unspecified trimester: O13.9

## 2014-08-30 LAB — CBC
HCT: 32 % — ABNORMAL LOW (ref 36.0–46.0)
HEMATOCRIT: 30.3 % — AB (ref 36.0–46.0)
Hemoglobin: 10.6 g/dL — ABNORMAL LOW (ref 12.0–15.0)
Hemoglobin: 11.3 g/dL — ABNORMAL LOW (ref 12.0–15.0)
MCH: 31 pg (ref 26.0–34.0)
MCH: 31.3 pg (ref 26.0–34.0)
MCHC: 35 g/dL (ref 30.0–36.0)
MCHC: 35.3 g/dL (ref 30.0–36.0)
MCV: 88.6 fL (ref 78.0–100.0)
MCV: 88.6 fL (ref 78.0–100.0)
Platelets: 129 10*3/uL — ABNORMAL LOW (ref 150–400)
Platelets: 140 10*3/uL — ABNORMAL LOW (ref 150–400)
RBC: 3.42 MIL/uL — ABNORMAL LOW (ref 3.87–5.11)
RBC: 3.61 MIL/uL — ABNORMAL LOW (ref 3.87–5.11)
RDW: 13.1 % (ref 11.5–15.5)
RDW: 12.9 % (ref 11.5–15.5)
WBC: 6.7 10*3/uL (ref 4.0–10.5)
WBC: 8.8 10*3/uL (ref 4.0–10.5)

## 2014-08-30 LAB — COMPREHENSIVE METABOLIC PANEL
ALK PHOS: 217 U/L — AB (ref 39–117)
ALT: 10 U/L (ref 0–35)
ALT: 11 U/L (ref 0–35)
ANION GAP: 11 (ref 5–15)
AST: 19 U/L (ref 0–37)
AST: 22 U/L (ref 0–37)
Albumin: 2.7 g/dL — ABNORMAL LOW (ref 3.5–5.2)
Albumin: 3.1 g/dL — ABNORMAL LOW (ref 3.5–5.2)
Alkaline Phosphatase: 193 U/L — ABNORMAL HIGH (ref 39–117)
Anion gap: 6 (ref 5–15)
BUN: 9 mg/dL (ref 6–23)
BUN: 11 mg/dL (ref 6–23)
CALCIUM: 8.8 mg/dL (ref 8.4–10.5)
CO2: 22 mmol/L (ref 19–32)
CO2: 18 mmol/L — ABNORMAL LOW (ref 19–32)
CREATININE: 0.74 mg/dL (ref 0.50–1.10)
Calcium: 9.1 mg/dL (ref 8.4–10.5)
Chloride: 108 mmol/L (ref 96–112)
Chloride: 108 mmol/L (ref 96–112)
Creatinine, Ser: 0.73 mg/dL (ref 0.50–1.10)
GFR calc Af Amer: >90 mL/min (ref >90)
GFR calc non Af Amer: >90 mL/min (ref >90)
GFR calc non Af Amer: >90 mL/min (ref >90)
GLUCOSE: 82 mg/dL (ref 70–99)
Glucose, Bld: 93 mg/dL (ref 70–99)
Potassium: 4.7 mmol/L (ref 3.5–5.1)
Potassium: 4 mmol/L (ref 3.5–5.1)
SODIUM: 137 mmol/L (ref 135–145)
Sodium: 136 mmol/L (ref 135–145)
TOTAL PROTEIN: 5.9 g/dL — AB (ref 6.0–8.3)
Total Bilirubin: 0.6 mg/dL (ref 0.3–1.2)
Total Bilirubin: 0.6 mg/dL (ref 0.3–1.2)
Total Protein: 6.6 g/dL (ref 6.0–8.3)

## 2014-08-30 LAB — PROTEIN / CREATININE RATIO, URINE
Creatinine, Urine: 71 mg/dL
Protein Creatinine Ratio: 0.2 — ABNORMAL HIGH (ref 0.00–0.15)
TOTAL PROTEIN, URINE: 14 mg/dL

## 2014-08-30 LAB — OB RESULTS CONSOLE GBS: GBS: NEGATIVE

## 2014-08-30 LAB — TYPE AND SCREEN
ABO/RH(D): B NEG
ANTIBODY SCREEN: NEGATIVE

## 2014-08-30 LAB — URIC ACID: Uric Acid, Serum: 6.4 mg/dL (ref 2.4–7.0)

## 2014-08-30 LAB — RPR: RPR: NONREACTIVE

## 2014-08-30 LAB — LACTATE DEHYDROGENASE: LDH: 126 U/L (ref 94–250)

## 2014-08-30 MED ORDER — TERBUTALINE SULFATE 1 MG/ML IJ SOLN: 0.2500 mg | Freq: Once | INTRAMUSCULAR | Status: AC | PRN

## 2014-08-30 MED ORDER — PHENYLEPHRINE 40 MCG/ML (10ML) SYRINGE FOR IV PUSH (FOR BLOOD PRESSURE SUPPORT)
PREFILLED_SYRINGE | INTRAVENOUS | Status: AC
  Filled 2014-08-30: qty 20

## 2014-08-30 MED ORDER — LACTATED RINGERS IV SOLN
500.0000 mL | INTRAVENOUS | Status: DC | PRN
  Administered 2014-08-30: 500 mL via INTRAVENOUS

## 2014-08-30 MED ORDER — LACTATED RINGERS IV SOLN
INTRAVENOUS | Status: DC
  Administered 2014-08-30 – 2014-08-31 (×3): via INTRAVENOUS

## 2014-08-30 MED ORDER — DIPHENHYDRAMINE HCL 50 MG/ML IJ SOLN: 12.5000 mg | INTRAMUSCULAR | Status: DC | PRN

## 2014-08-30 MED ORDER — OXYTOCIN 40 UNITS IN LACTATED RINGERS INFUSION - SIMPLE MED
62.5000 mL/h | INTRAVENOUS | Status: DC
  Administered 2014-08-31: 62.5 mL/h via INTRAVENOUS

## 2014-08-30 MED ORDER — LIDOCAINE HCL (PF) 1 % IJ SOLN
INTRAMUSCULAR | Status: DC | PRN
  Administered 2014-08-30 (×2): 5 mL

## 2014-08-30 MED ORDER — FENTANYL 2.5 MCG/ML BUPIVACAINE 1/10 % EPIDURAL INFUSION (WH - ANES)
INTRAMUSCULAR | Status: AC
  Administered 2014-08-30: 14 mL/h via EPIDURAL
  Filled 2014-08-30: qty 125

## 2014-08-30 MED ORDER — PHENYLEPHRINE 40 MCG/ML (10ML) SYRINGE FOR IV PUSH (FOR BLOOD PRESSURE SUPPORT): 80.0000 ug | PREFILLED_SYRINGE | INTRAVENOUS | Status: DC | PRN

## 2014-08-30 MED ORDER — OXYCODONE-ACETAMINOPHEN 5-325 MG PO TABS: 1.0000 | ORAL_TABLET | ORAL | Status: DC | PRN

## 2014-08-30 MED ORDER — FENTANYL CITRATE 0.05 MG/ML IJ SOLN
100.0000 ug | INTRAMUSCULAR | Status: DC | PRN
  Administered 2014-08-30: 100 ug via INTRAVENOUS
  Filled 2014-08-30: qty 2

## 2014-08-30 MED ORDER — FLEET ENEMA 7-19 GM/118ML RE ENEM: 1.0000 | ENEMA | RECTAL | Status: DC | PRN

## 2014-08-30 MED ORDER — OXYTOCIN 40 UNITS IN LACTATED RINGERS INFUSION - SIMPLE MED
1.0000 m[IU]/min | INTRAVENOUS | Status: DC
  Administered 2014-08-30: 2 m[IU]/min via INTRAVENOUS
  Filled 2014-08-30: qty 1000

## 2014-08-30 MED ORDER — FENTANYL 2.5 MCG/ML BUPIVACAINE 1/10 % EPIDURAL INFUSION (WH - ANES)
INTRAMUSCULAR | Status: DC | PRN
  Administered 2014-08-30: 14 mL/h via EPIDURAL

## 2014-08-30 MED ORDER — OXYCODONE-ACETAMINOPHEN 5-325 MG PO TABS: 2.0000 | ORAL_TABLET | ORAL | Status: DC | PRN

## 2014-08-30 MED ORDER — ONDANSETRON HCL 4 MG/2ML IJ SOLN
4.0000 mg | Freq: Four times a day (QID) | INTRAMUSCULAR | Status: DC | PRN
  Administered 2014-08-30: 4 mg via INTRAVENOUS
  Filled 2014-08-30: qty 2

## 2014-08-30 MED ORDER — EPHEDRINE 5 MG/ML INJ: 10.0000 mg | INTRAVENOUS | Status: DC | PRN

## 2014-08-30 MED ORDER — FENTANYL 2.5 MCG/ML BUPIVACAINE 1/10 % EPIDURAL INFUSION (WH - ANES)
14.0000 mL/h | INTRAMUSCULAR | Status: DC | PRN
  Administered 2014-08-30 (×3): 14 mL/h via EPIDURAL
  Filled 2014-08-30 (×2): qty 125

## 2014-08-30 MED ORDER — ACETAMINOPHEN 325 MG PO TABS
650.0000 mg | ORAL_TABLET | ORAL | Status: DC | PRN
Start: 2014-08-30 — End: 2014-08-31

## 2014-08-30 MED ORDER — LIDOCAINE HCL (PF) 1 % IJ SOLN: 30.0000 mL | INTRAMUSCULAR | Status: DC | PRN

## 2014-08-30 MED ORDER — OXYTOCIN BOLUS FROM INFUSION: 500.0000 mL | INTRAVENOUS | Status: DC

## 2014-08-30 MED ORDER — CITRIC ACID-SODIUM CITRATE 334-500 MG/5ML PO SOLN
30.0000 mL | ORAL | Status: DC | PRN
  Administered 2014-08-30: 30 mL via ORAL
  Filled 2014-08-30: qty 15

## 2014-08-30 MED ORDER — BUTORPHANOL TARTRATE 1 MG/ML IJ SOLN: 1.0000 mg | INTRAMUSCULAR | Status: DC | PRN

## 2014-08-30 MED ORDER — LACTATED RINGERS IV SOLN
500.0000 mL | Freq: Once | INTRAVENOUS | Status: AC
  Administered 2014-08-30: 500 mL via INTRAVENOUS

## 2014-08-30 NOTE — Progress Notes (Signed)
Patient ID: [name], female   DOB: 1990-09-05, 24 y.o.   MRN: [id number]  Comfortable with epidural  AFVSS gen NAD  FHTs 140's, good var, catefory 1 toco q 2min  6/90/0-+1  IUPC place w/o diff/comp. Add pitocin prn

## 2014-08-30 NOTE — MAU Note (Signed)
Pt reports having ctx on and off all evening got worse around  2130.Denies SROM c/o mucusy discharge. Good fetal movement reported.

## 2014-08-30 NOTE — MAU Note (Signed)
Pt seen in MAU earlier this am with ctxs and sent home. Returns saying ctxs are closer and stronger. Some dark bloody d/c

## 2014-08-30 NOTE — Progress Notes (Signed)
Pt up to walk awhile but came back early. STates just doesn't feel like she can walk. Alittle nauseated and getting shaky. Ginger ale to pt

## 2014-08-30 NOTE — Progress Notes (Addendum)
Dr Ellyn HackBovard notified of pt's admission and status. Pt to walk an hour and will reck cervix. Aware of sve, ctx pattern, B/Ps sl elevated and about same as were on prior admission today.

## 2014-08-30 NOTE — Anesthesia Procedure Notes (Signed)
Epidural Patient location during procedure: OB Start time: 08/30/2014 10:09 AM End time: 08/30/2014 10:40 AM  Staffing Anesthesiologist: Sebastian AcheMANNY, Tashi Andujo Performed by: anesthesiologist   Preanesthetic Checklist Completed: patient identified, site marked, surgical consent, pre-op evaluation, timeout performed, IV checked, risks and benefits discussed and monitors and equipment checked  Epidural Patient position: sitting Prep: DuraPrep Patient monitoring: heart rate, continuous pulse ox and blood pressure Approach: midline Location: L2-L3 Injection technique: LOR air  Needle:  Needle type: Tuohy  Needle gauge: 17 G Needle length: 9 cm and 9 Needle insertion depth: 6 cm Catheter type: closed end flexible Catheter size: 19 Gauge Catheter at skin depth: 11 cm Test dose: negative  Assessment Events: blood not aspirated, injection not painful, no injection resistance, negative IV test and no paresthesia  Additional Notes   Patient tolerated the insertion well without complications.Reason for block:procedure for pain

## 2014-08-30 NOTE — Progress Notes (Signed)
Notified of pt's cervical change, ctx pattern, orders to admit.

## 2014-08-30 NOTE — Progress Notes (Signed)
Patient ID: [name], female   DOB: Oct 24, 1990, 24 y.o.   MRN: [id number]  Comfortable with epidural, napping  AFVSS gen NAD FHTs 150's, mod car, category 1 toco Q 2min  SVE 7/90/+1  Anticipate SVD, d/w pt prolonged/protracted labor

## 2014-08-30 NOTE — Anesthesia Preprocedure Evaluation (Signed)
Anesthesia Evaluation  Patient identified by MRN, date of birth, ID band Patient awake and Patient confused    Reviewed: Allergy & Precautions, H&P , NPO status , Patient's Chart, lab work & pertinent test results  Airway Mallampati: II   Neck ROM: full    Dental  (+) Teeth Intact   Pulmonary neg pulmonary ROS,  breath sounds clear to auscultation  Pulmonary exam normal       Cardiovascular Exercise Tolerance: Good hypertension, Rhythm:regular Rate:Normal  Mild HTN   Neuro/Psych    GI/Hepatic Neg liver ROS,   Endo/Other  negative endocrine ROS  Renal/GU negative Renal ROS     Musculoskeletal   Abdominal Normal abdominal exam  (+)   Peds  Hematology   Anesthesia Other Findings   Reproductive/Obstetrics (+) Pregnancy                             Anesthesia Physical Anesthesia Plan  ASA: II  Anesthesia Plan: Epidural   Post-op Pain Management:    Induction:   Airway Management Planned:   Additional Equipment:   Intra-op Plan:   Post-operative Plan:   Informed Consent: I have reviewed the patients History and Physical, chart, labs and discussed the procedure including the risks, benefits and alternatives for the proposed anesthesia with the patient or authorized representative who has indicated his/her understanding and acceptance.     Plan Discussed with:   Anesthesia Plan Comments:         Anesthesia Quick Evaluation

## 2014-08-30 NOTE — Progress Notes (Signed)
Patient ID: [name], female   DOB: 1991/01/30, 24 y.o.   MRN: [id number]  Uncomfortable with ctx  AFVSS, monitor BP mildly elevated gen NAD FHTs 120-130's, good var, category 1 toco q 4  AROM for light meconium, d/w pt; without diff/comp  SVE 4.5/100/0  Continue current mgmt, augment prn Expect SVD

## 2014-08-30 NOTE — H&P (Signed)
[name] is a 24 y.o. female G1 at 39+ with cervical change in MAU.  Relatively uncomplicated PNC except late entry to care.  GBBS neg.   Maternal Medical History:  Reason for admission: Contractions.   Contractions: Onset was 13-24 hours ago.    Fetal activity: Perceived fetal activity is normal.    Prenatal complications: no prenatal complications Prenatal Complications - Diabetes: none.    OB History    Gravida Para Term Preterm AB TAB SAB Ectopic Multiple Living   1         0    G1 present  +abn pap, Chl, HPV  Past Medical History  Diagnosis Date  . Chest pain, unspecified   . Dyspnea   . Hyperventilation   . Panic attack   . Anxiety and depression   . Malaise and fatigue   . Allergy   . Panic attack   . Anxiety   . Gluten intolerance   . IBS (irritable bowel syndrome)   . Migraine   . Pregnancy induced hypertension    Past Surgical History  Procedure Laterality Date  . Tonsillectomy    . Adenoidectomy    . Wisdom tooth extraction     Family History: family history includes Cancer in her mother; Heart disease in her maternal grandfather; Hypertension in her mother. Social History:  reports that she has never smoked. She has never used smokeless tobacco. She reports that she does not drink alcohol or use illicit drugs. single Meds PNV All Sulfa   Prenatal Transfer Tool  Maternal Diabetes: No Genetic Screening: Normal Maternal Ultrasounds/Referrals: Normal Fetal Ultrasounds or other Referrals:  None Maternal Substance Abuse:  No Significant Maternal Medications:  None Significant Maternal Lab Results:  Lab values include: Group B Strep negative Other Comments:  None  Review of Systems  Constitutional: Negative.   HENT: Negative.   Eyes: Negative.   Respiratory: Negative.   Cardiovascular: Negative.   Gastrointestinal: Negative.   Genitourinary: Negative.   Musculoskeletal: Negative.   Skin: Negative.   Neurological: Negative.    Psychiatric/Behavioral: Negative.     Dilation: 3 Effacement (%): 100 Station: -1 Exam by:: SBeck, RN Blood pressure 134/92, pulse 88, temperature 97.7 F (36.5 C), temperature source Oral, resp. rate 20, last menstrual period 11/26/2013. Maternal Exam:  Abdomen: Patient reports no abdominal tenderness. Fundal height is appropriate for gestation.   Estimated fetal weight is 7#.   Fetal presentation: vertex  Introitus: Normal vulva. Normal vagina.  Pelvis: adequate for delivery.   Cervix: Cervix evaluated by digital exam.     Physical Exam  Constitutional: She is oriented to person, place, and time. She appears well-developed and well-nourished.  HENT:  Head: Normocephalic and atraumatic.  Cardiovascular: Normal rate and regular rhythm.   Respiratory: Effort normal and breath sounds normal. No respiratory distress. She has no wheezes.  GI: Soft. Bowel sounds are normal. She exhibits no distension. There is no tenderness.  Musculoskeletal: Normal range of motion.  Neurological: She is alert and oriented to person, place, and time.  Skin: Skin is warm.  Psychiatric: She has a normal mood and affect. Her behavior is normal.    Prenatal labs: ABO, Rh: --/--/B NEG (08/11 1903) Antibody:  neg Rubella:  immune RPR:   NR HBsAg:   neg HIV:   neg GBS:   neg  Hgb 11.7/Plt 207K/ Ur Cx neg/ Chl neg/GC neg/VI/ glucola 117  Dated by 12 wk [place] Nl anat, ant plac, female  Rhogam 06/11/14 Tdap  06/11/14  Assessment/Plan: 23yo G1P0 at 39+ Expect SVD gbbs neg Pitocin to augment, prn   Bovard-Stuckert, Karnisha Lefebre 08/30/2014, 9:05 AM

## 2014-08-31 ENCOUNTER — Encounter (HOSPITAL_COMMUNITY): Payer: Self-pay | Admitting: Obstetrics and Gynecology

## 2014-08-31 LAB — CBC
HCT: 23.1 % — ABNORMAL LOW (ref 36.0–46.0)
Hemoglobin: 8.2 g/dL — ABNORMAL LOW (ref 12.0–15.0)
MCH: 31.8 pg (ref 26.0–34.0)
MCHC: 35.5 g/dL (ref 30.0–36.0)
MCV: 89.5 fL (ref 78.0–100.0)
Platelets: 98 10*3/uL — ABNORMAL LOW (ref 150–400)
RBC: 2.58 MIL/uL — AB (ref 3.87–5.11)
RDW: 13.2 % (ref 11.5–15.5)
WBC: 13.7 10*3/uL — ABNORMAL HIGH (ref 4.0–10.5)

## 2014-08-31 MED ORDER — CALCIUM CARBONATE ANTACID 500 MG PO CHEW: 2.0000 | CHEWABLE_TABLET | Freq: Three times a day (TID) | ORAL | Status: DC | PRN

## 2014-08-31 MED ORDER — WITCH HAZEL-GLYCERIN EX PADS: 1.0000 "application " | MEDICATED_PAD | CUTANEOUS | Status: DC | PRN

## 2014-08-31 MED ORDER — ONDANSETRON HCL 4 MG PO TABS
4.0000 mg | ORAL_TABLET | ORAL | Status: DC | PRN
  Administered 2014-08-31: 4 mg via ORAL
  Filled 2014-08-31: qty 1

## 2014-08-31 MED ORDER — ACETAMINOPHEN 325 MG PO TABS
650.0000 mg | ORAL_TABLET | ORAL | Status: DC | PRN
  Administered 2014-09-01: 650 mg via ORAL
  Filled 2014-08-31: qty 2

## 2014-08-31 MED ORDER — OXYCODONE-ACETAMINOPHEN 5-325 MG PO TABS
1.0000 | ORAL_TABLET | ORAL | Status: DC | PRN
  Administered 2014-08-31: 1 via ORAL
  Filled 2014-08-31: qty 1

## 2014-08-31 MED ORDER — DIBUCAINE 1 % RE OINT
1.0000 "application " | TOPICAL_OINTMENT | RECTAL | Status: DC | PRN
  Filled 2014-08-31: qty 28

## 2014-08-31 MED ORDER — BENZOCAINE-MENTHOL 20-0.5 % EX AERO
1.0000 "application " | INHALATION_SPRAY | CUTANEOUS | Status: DC | PRN
  Administered 2014-09-01: 1 via TOPICAL
  Filled 2014-08-31 (×3): qty 56

## 2014-08-31 MED ORDER — SIMETHICONE 80 MG PO CHEW: 80.0000 mg | CHEWABLE_TABLET | ORAL | Status: DC | PRN

## 2014-08-31 MED ORDER — LACTATED RINGERS IV SOLN: INTRAVENOUS | Status: DC

## 2014-08-31 MED ORDER — LANOLIN HYDROUS EX OINT: TOPICAL_OINTMENT | CUTANEOUS | Status: DC | PRN

## 2014-08-31 MED ORDER — PRENATAL MULTIVITAMIN CH
1.0000 | ORAL_TABLET | Freq: Every day | ORAL | Status: DC
Start: 2014-08-31 — End: 2014-09-02
  Administered 2014-08-31 – 2014-09-01 (×2): 1 via ORAL
  Filled 2014-08-31 (×2): qty 1

## 2014-08-31 MED ORDER — OXYCODONE-ACETAMINOPHEN 5-325 MG PO TABS: 2.0000 | ORAL_TABLET | ORAL | Status: DC | PRN

## 2014-08-31 MED ORDER — SENNOSIDES-DOCUSATE SODIUM 8.6-50 MG PO TABS
2.0000 | ORAL_TABLET | ORAL | Status: DC
  Administered 2014-09-01 (×2): 2 via ORAL
  Filled 2014-08-31 (×3): qty 2

## 2014-08-31 MED ORDER — ZOLPIDEM TARTRATE 5 MG PO TABS: 5.0000 mg | ORAL_TABLET | Freq: Every evening | ORAL | Status: DC | PRN

## 2014-08-31 MED ORDER — ONDANSETRON HCL 4 MG/2ML IJ SOLN: 4.0000 mg | INTRAMUSCULAR | Status: DC | PRN

## 2014-08-31 MED ORDER — FAMOTIDINE 20 MG PO TABS
20.0000 mg | ORAL_TABLET | Freq: Every day | ORAL | Status: DC
  Administered 2014-08-31 – 2014-09-02 (×3): 20 mg via ORAL
  Filled 2014-08-31 (×3): qty 1

## 2014-08-31 MED ORDER — IBUPROFEN 600 MG PO TABS
600.0000 mg | ORAL_TABLET | Freq: Four times a day (QID) | ORAL | Status: DC
Start: 2014-08-31 — End: 2014-09-02
  Administered 2014-08-31 – 2014-09-02 (×9): 600 mg via ORAL
  Filled 2014-08-31 (×9): qty 1

## 2014-08-31 MED ORDER — DIPHENHYDRAMINE HCL 25 MG PO CAPS: 25.0000 mg | ORAL_CAPSULE | Freq: Four times a day (QID) | ORAL | Status: DC | PRN

## 2014-08-31 NOTE — Anesthesia Postprocedure Evaluation (Signed)
  Anesthesia Post-op Note  Patient: Marnee Springerra B [place]  Procedure(s) Performed: * No procedures listed *  Patient Location: PACU and Mother/Baby  Anesthesia Type:Epidural  Level of Consciousness: awake, alert  and oriented  Airway and Oxygen Therapy: Patient Spontanous Breathing  Post-op Pain: mild  Post-op Assessment: Post-op Vital signs reviewed, Patient's Cardiovascular Status Stable, Respiratory Function Stable, No signs of Nausea or vomiting, Adequate PO intake, Pain level controlled, No headache, No backache, No residual numbness and No residual motor weakness  Post-op Vital Signs: Reviewed and stable  Last Vitals:  Filed Vitals:   08/31/14 0545  BP: 135/88  Pulse: 87  Temp: 36.8 C  Resp: 16    Complications: No apparent anesthesia complications

## 2014-08-31 NOTE — Progress Notes (Signed)
Post Partum Day 0 Subjective: no complaints, up ad lib, voiding, tolerating PO and nl lochia, pain controlled  Objective: Blood pressure 135/88, pulse 87, temperature 98.2 F (36.8 C), temperature source Oral, resp. rate 16, height 5\' 5"  (1.651 m), weight 74.39 kg (164 lb), last menstrual period 11/26/2013, SpO2 97 %, unknown if currently breastfeeding.  Physical Exam:  General: alert and no distress Lochia: appropriate Uterine Fundus: firm   Recent Labs  08/30/14 0826 08/31/14 0535  HGB 11.3* 8.2*  HCT 32.0* 23.1*    Assessment/Plan: Plan for discharge tomorrow, Breastfeeding and Lactation consult.  Routine care.     LOS: 1 day   Bovard-Stuckert, Marithza Malachi 08/31/2014, 8:57 AM

## 2014-08-31 NOTE — Progress Notes (Signed)
Patient ID: [name], female   DOB: 1990-08-13, 24 y.o.   MRN: [id number]   Comfortable with epidural, some pressure with ctx  AFVSS gen NAD FHTs 150's, category 1 toco Q 2min  SVE 10/100/+2 - cervical lip reduced with ctx  Will start pushing, expect SVD soon

## 2014-08-31 NOTE — Lactation Note (Signed)
This note was copied from the chart of Peggy Coral Spikeserra Ausborn. Lactation Consultation Note  Patient Name: [name] [id number]'s Date: 08/31/2014 Reason for consult: Initial assessment Mom reports baby has been nursing well thus far. [facility]. Basics reviewed with Mom as well encouraging  Mom to BF with feeding ques. Advised baby should be at the breast 8-12 times in 24 hours and with feeding ques, cluster feeding reviewed. Mom denies other questions/concerns. Lactation brochure left for review, advised of OP services and support group. Encouraged to call as needed for assist.   Maternal Data Has patient been taught Hand Expression?: Yes Does the patient have breastfeeding experience prior to this delivery?: No  Feeding Feeding Type: Breast Fed Length of feed: 30 min  LATCH Score/Interventions                      Lactation Tools Discussed/Used WIC Program: Yes   Consult Status Consult Status: Follow-up Date: 08/31/14 Follow-up type: In-patient    Alfred LevinsGranger, Jader Desai Ann 08/31/2014, 1:20 PM

## 2014-08-31 NOTE — Progress Notes (Signed)
MOB was referred for history of depression/anxiety. * Referral screened out by Clinical Social Worker because none of the following criteria appear to apply: ~ History of anxiety/depression during this pregnancy, or of post-partum depression. ~ Diagnosis of anxiety and/or depression within last 3 years OR * MOB's symptoms currently being treated with medication and/or therapy. Please contact the Clinical Social Worker if needs arise, or if MOB requests.   

## 2014-08-31 NOTE — Progress Notes (Signed)
Ur chart review completed.  

## 2014-09-01 MED ORDER — OXYCODONE-ACETAMINOPHEN 5-325 MG PO TABS: 1.0000 | ORAL_TABLET | Freq: Four times a day (QID) | ORAL | Status: DC | PRN

## 2014-09-01 MED ORDER — CVS PRENATAL GUMMY 0.4-113.5 MG PO CHEW: 2.0000 | CHEWABLE_TABLET | Freq: Every day | ORAL | Status: DC

## 2014-09-01 MED ORDER — IBUPROFEN 800 MG PO TABS: 800.0000 mg | ORAL_TABLET | Freq: Three times a day (TID) | ORAL | Status: DC | PRN

## 2014-09-01 NOTE — Discharge Summary (Addendum)
Obstetric Discharge Summary Reason for Admission: onset of labor Prenatal Procedures: none Intrapartum Procedures: spontaneous vaginal delivery Postpartum Procedures: none Complications-Operative and Postpartum: 2nd degree perineal laceration and vaginal laceration HEMOGLOBIN  Date Value Ref Range Status  08/31/2014 8.2* 12.0 - 15.0 g/dL Final    Comment:    DELTA CHECK NOTED REPEATED TO VERIFY    HCT  Date Value Ref Range Status  08/31/2014 23.1* 36.0 - 46.0 % Final    Physical Exam:  General: alert and no distress Lochia: appropriate Uterine Fundus: firm  Discharge Diagnoses: Term Pregnancy-delivered  Discharge Information: Date: 09/01/2014 Activity: pelvic rest Diet: routine Medications: PNV, Ibuprofen and Percocet Condition: stable Instructions: refer to practice specific booklet Discharge to: home Follow-up Information    Follow up with Bovard-Stuckert, Maddy Graham, MD. Schedule an appointment as soon as possible for a visit in 6 weeks.   Specialty:  Obstetrics and Gynecology   Why:  for postpartum check   Contact information:   [address] [place] [address] [phone]       Newborn Data: Live born female  Birth Weight: 8 lb 8.3 oz (3865 g) APGAR: 7, 9  Home with mother.  Bovard-Stuckert, Rayonna Heldman 09/01/2014, 7:51 AM   Initially desired d/c 4/12, working on BF, etc, decided to stay until 4/13.  Stable, voiced understanding to instructions

## 2014-09-01 NOTE — Progress Notes (Signed)
Post Partum Day 1 Subjective: no complaints, up ad lib, voiding, tolerating PO and nl lochia, pain controlled  Objective: Blood pressure 131/87, pulse 83, temperature 97.7 F (36.5 C), temperature source Oral, resp. rate 20, height 5\' 5"  (1.651 m), weight 74.39 kg (164 lb), last menstrual period 11/26/2013, SpO2 97 %, unknown if currently breastfeeding.  Physical Exam:  General: alert and no distress Lochia: appropriate Uterine Fundus: firm   Recent Labs  08/30/14 0826 08/31/14 0535  HGB 11.3* 8.2*  HCT 32.0* 23.1*    Assessment/Plan: Discharge home per patient request, Breastfeeding and Lactation consult.  D/c with Motrin, percocet and PNV.  F/u 6weeks.  Call for circumcision appointment ASAP   LOS: 2 days   Bovard-Stuckert, Terianne Thaker 09/01/2014, 7:45 AM

## 2014-09-02 ENCOUNTER — Inpatient Hospital Stay (HOSPITAL_COMMUNITY): Admission: RE | Admit: 2014-09-02 | Payer: Medicaid Other | Source: Ambulatory Visit

## 2014-09-02 LAB — CBC
HEMATOCRIT: 22.6 % — AB (ref 36.0–46.0)
Hemoglobin: 7.9 g/dL — ABNORMAL LOW (ref 12.0–15.0)
MCH: 31.9 pg (ref 26.0–34.0)
MCHC: 35 g/dL (ref 30.0–36.0)
MCV: 91.1 fL (ref 78.0–100.0)
PLATELETS: 128 10*3/uL — AB (ref 150–400)
RBC: 2.48 MIL/uL — ABNORMAL LOW (ref 3.87–5.11)
RDW: 13.4 % (ref 11.5–15.5)
WBC: 7.4 10*3/uL (ref 4.0–10.5)

## 2014-09-02 NOTE — Progress Notes (Signed)
Post Partum Day 2 Subjective: no complaints, up ad lib, voiding, tolerating PO and nl lochia, pain controlled  Objective: Blood pressure 129/96, pulse 65, temperature 98.2 F (36.8 C), temperature source Oral, resp. rate 19, height 5\' 5"  (1.651 m), weight 74.39 kg (164 lb), last menstrual period 11/26/2013, SpO2 99 %, unknown if currently breastfeeding.  Physical Exam:  General: alert and no distress Lochia: appropriate Uterine Fundus: firm   Recent Labs  08/31/14 0535 09/02/14 0535  HGB 8.2* 7.9*  HCT 23.1* 22.6*    Assessment/Plan: Discharge home, Breastfeeding and Lactation consult.  Routine care.  Initially had wanted d/c on 4/12, then decided to wait for 4/13.  D/c with motrin, percocet, and PNV.  F/u 6 weeks   LOS: 3 days   Bovard-Stuckert, Rowdy Guerrini 09/02/2014, 7:41 AM

## 2015-04-07 ENCOUNTER — Emergency Department
Admission: EM | Admit: 2015-04-07 | Discharge: 2015-04-07 | Disposition: A | Discharge status: Home / Self Care | Payer: Medicaid Other | Attending: Emergency Medicine | Admitting: Emergency Medicine

## 2015-04-07 DIAGNOSIS — N12 Tubulo-interstitial nephritis, not specified as acute or chronic: Secondary | ICD-10-CM | POA: Insufficient documentation

## 2015-04-07 DIAGNOSIS — R51 Headache: Secondary | ICD-10-CM | POA: Insufficient documentation

## 2015-04-07 DIAGNOSIS — R197 Diarrhea, unspecified: Secondary | ICD-10-CM | POA: Insufficient documentation

## 2015-04-07 DIAGNOSIS — R6883 Chills (without fever): Secondary | ICD-10-CM

## 2015-04-07 DIAGNOSIS — Z3202 Encounter for pregnancy test, result negative: Secondary | ICD-10-CM | POA: Insufficient documentation

## 2015-04-07 DIAGNOSIS — R112 Nausea with vomiting, unspecified: Secondary | ICD-10-CM

## 2015-04-07 DIAGNOSIS — Z79899 Other long term (current) drug therapy: Secondary | ICD-10-CM | POA: Insufficient documentation

## 2015-04-07 LAB — URINALYSIS COMPLETE WITH MICROSCOPIC (ARMC ONLY)
Bilirubin Urine: NEGATIVE
Glucose, UA: NEGATIVE mg/dL
NITRITE: NEGATIVE
PROTEIN: 100 mg/dL — AB
SPECIFIC GRAVITY, URINE: 1.032 — AB (ref 1.005–1.030)
pH: 5 (ref 5.0–8.0)

## 2015-04-07 LAB — COMPREHENSIVE METABOLIC PANEL
ALBUMIN: 4.7 g/dL (ref 3.5–5.0)
ALK PHOS: 82 U/L (ref 38–126)
ALT: 11 U/L — ABNORMAL LOW (ref 14–54)
ANION GAP: 11 (ref 5–15)
AST: 14 U/L — ABNORMAL LOW (ref 15–41)
BILIRUBIN TOTAL: 1.8 mg/dL — AB (ref 0.3–1.2)
BUN: 16 mg/dL (ref 6–20)
CALCIUM: 9.3 mg/dL (ref 8.9–10.3)
CO2: 21 mmol/L — ABNORMAL LOW (ref 22–32)
Chloride: 105 mmol/L (ref 101–111)
Creatinine, Ser: 0.56 mg/dL (ref 0.44–1.00)
GFR calc non Af Amer: >60 mL/min (ref >60)
Glucose, Bld: 98 mg/dL (ref 65–99)
POTASSIUM: 3.5 mmol/L (ref 3.5–5.1)
Sodium: 137 mmol/L (ref 135–145)
TOTAL PROTEIN: 8.1 g/dL (ref 6.5–8.1)

## 2015-04-07 LAB — CBC
HEMATOCRIT: 42 % (ref 35.0–47.0)
HEMOGLOBIN: 14.7 g/dL (ref 12.0–16.0)
MCH: 31.4 pg (ref 26.0–34.0)
MCHC: 34.9 g/dL (ref 32.0–36.0)
MCV: 90 fL (ref 80.0–100.0)
Platelets: 146 10*3/uL — ABNORMAL LOW (ref 150–440)
RBC: 4.67 MIL/uL (ref 3.80–5.20)
RDW: 12.4 % (ref 11.5–14.5)
WBC: 6.7 10*3/uL (ref 3.6–11.0)

## 2015-04-07 LAB — LIPASE, BLOOD: Lipase: 24 U/L (ref 11–51)

## 2015-04-07 LAB — POCT PREGNANCY, URINE: Preg Test, Ur: NEGATIVE

## 2015-04-07 MED ORDER — ONDANSETRON 4 MG PO TBDP: 4.0000 mg | ORAL_TABLET | Freq: Three times a day (TID) | ORAL | Status: DC | PRN

## 2015-04-07 MED ORDER — SODIUM CHLORIDE 0.9 % IV BOLUS (SEPSIS)
1000.0000 mL | Freq: Once | INTRAVENOUS | Status: AC
  Administered 2015-04-07: 1000 mL via INTRAVENOUS

## 2015-04-07 MED ORDER — OXYCODONE-ACETAMINOPHEN 5-325 MG PO TABS: 1.0000 | ORAL_TABLET | Freq: Four times a day (QID) | ORAL | Status: DC | PRN

## 2015-04-07 MED ORDER — KETOROLAC TROMETHAMINE 30 MG/ML IJ SOLN: 30.0000 mg | Freq: Once | INTRAMUSCULAR | Status: DC

## 2015-04-07 MED ORDER — FENTANYL CITRATE (PF) 100 MCG/2ML IJ SOLN
50.0000 ug | Freq: Once | INTRAMUSCULAR | Status: AC
  Administered 2015-04-07: 50 ug via INTRAVENOUS

## 2015-04-07 MED ORDER — CEPHALEXIN 500 MG PO CAPS: 500.0000 mg | ORAL_CAPSULE | Freq: Four times a day (QID) | ORAL | Status: AC

## 2015-04-07 MED ORDER — ONDANSETRON HCL 4 MG/2ML IJ SOLN
4.0000 mg | Freq: Once | INTRAMUSCULAR | Status: AC | PRN
  Administered 2015-04-07: 4 mg via INTRAVENOUS
  Filled 2015-04-07: qty 2

## 2015-04-07 MED ORDER — ONDANSETRON HCL 4 MG/2ML IJ SOLN
4.0000 mg | Freq: Once | INTRAMUSCULAR | Status: AC
  Administered 2015-04-07: 4 mg via INTRAVENOUS

## 2015-04-07 MED ORDER — DEXTROSE 5 % IV SOLN
1.0000 g | Freq: Once | INTRAVENOUS | Status: AC
  Administered 2015-04-07: 1 g via INTRAVENOUS
  Filled 2015-04-07 (×2): qty 10

## 2015-04-07 MED ORDER — FENTANYL CITRATE (PF) 100 MCG/2ML IJ SOLN
INTRAMUSCULAR | Status: AC
  Administered 2015-04-07: 50 ug via INTRAVENOUS
  Filled 2015-04-07: qty 2

## 2015-04-07 NOTE — ED Provider Notes (Signed)
[facility] Emergency Department Provider Note  ____________________________________________  Time seen: Approximately 3:30 PM  I have reviewed the triage vital signs and the nursing notes.   HISTORY  Chief Complaint Emesis and Diarrhea    HPI [name] is a 24 y.o. female with a history of IBS presenting with 2-3 days of urinary symptoms, and 2 days of nausea, vomiting, diarrhea, and diffuse myalgias with mild headache. Patient reports that several days ago she started noticing pain with urination associated with frequency. Yesterday she developed nausea and vomiting with diarrhea that was nonbloody. She denies any cough or cold symptoms. She denies any change in her vaginal discharge. She does have associated suprapubic cramping. She has not had any fever, but has had chills. She has not noticed anything that can make her symptoms better or worse.   Past Medical History  Diagnosis Date  . Chest pain, unspecified   . Dyspnea   . Hyperventilation   . Panic attack   . Anxiety and depression   . Malaise and fatigue   . Allergy   . Panic attack   . Anxiety   . Gluten intolerance   . IBS (irritable bowel syndrome)   . Migraine   . Pregnancy induced hypertension   . SVD (spontaneous vaginal delivery) 08/31/2014    Patient Active Problem List   Diagnosis Date Noted  . SVD (spontaneous vaginal delivery) 08/31/2014  . Labor and delivery indication for care or intervention 08/30/2014  . FHR (fetal heart rate) nonreactive   . [redacted] weeks gestation of pregnancy   . Encounter for fetal anatomic survey   . [redacted] weeks gestation of pregnancy   . PANIC ATTACK 10/27/2008  . ANXIETY DEPRESSION 10/27/2008    Past Surgical History  Procedure Laterality Date  . Tonsillectomy    . Adenoidectomy    . Wisdom tooth extraction      Current Outpatient Rx  Name  Route  Sig  Dispense  Refill  . acetaminophen (TYLENOL) 500 MG tablet   Oral   Take 1,000 mg by mouth  every 6 (six) hours as needed for moderate pain.         . calcium carbonate (TUMS - DOSED IN MG ELEMENTAL CALCIUM) 500 MG chewable tablet   Oral   Chew 2 tablets by mouth 3 (three) times daily as needed for indigestion or heartburn.         . cephALEXin (KEFLEX) 500 MG capsule   Oral   Take 1 capsule (500 mg total) by mouth 4 (four) times daily.   56 capsule   0   . cyclobenzaprine (FLEXERIL) 10 MG tablet   Oral   Take 1 tablet (10 mg total) by mouth at bedtime.   30 tablet   0   . diphenhydrAMINE (BENADRYL) 25 MG tablet   Oral   Take 25 mg by mouth at bedtime as needed for sleep.         Marland Kitchen Doxylamine-Pyridoxine 10-10 MG TBEC   Oral   Take 2 tablets by mouth at bedtime.   60 tablet   1   . ibuprofen (ADVIL,MOTRIN) 800 MG tablet   Oral   Take 1 tablet (800 mg total) by mouth every 8 (eight) hours as needed.   45 tablet   1   . ondansetron (ZOFRAN ODT) 4 MG disintegrating tablet   Oral   Take 1 tablet (4 mg total) by mouth every 8 (eight) hours as needed for nausea or vomiting.  20 tablet   0   . oxyCODONE-acetaminophen (ROXICET) 5-325 MG tablet   Oral   Take 1 tablet by mouth every 6 (six) hours as needed.   12 tablet   0   . Prenatal Vit-Min-FA-Fish Oil ([employer] PRENATAL GUMMY) 0.4-113.5 MG CHEW   Oral   Chew 2 tablets by mouth daily.   60 tablet   12   . ranitidine (ZANTAC) 150 MG tablet   Oral   Take 150 mg by mouth 2 (two) times daily.           Allergies Hydrocodone and Sulfa antibiotics  Family History  Problem Relation Age of Onset  . Hypertension Mother   . Cancer Mother   . Heart disease Maternal Grandfather     Social History Social History  Substance Use Topics  . Smoking status: Never Smoker   . Smokeless tobacco: Never Used  . Alcohol Use: No    Review of Systems Constitutional: Negative for fever. Positive for chills. No lightheadedness or syncope. Eyes: No visual changes. ENT: No sore throat. Cardiovascular: Denies  chest pain, palpitations. Respiratory: Denies shortness of breath.  No cough. Gastrointestinal: Positive suprapubic abdominal pain.  Positive nausea, positive vomiting.  Positive diarrhea.  No constipation. Genitourinary: Positive for dysuria, increased frequency. Negative for changes in vaginal discharge. Musculoskeletal: Negative for back pain. Positive for diffuse myalgias. Skin: Negative for rash. Neurological: Positive for mild headaches, no focal weakness or numbness.  10-point ROS otherwise negative.  ____________________________________________   PHYSICAL EXAM:  VITAL SIGNS: ED Triage Vitals  Enc Vitals Group     BP 04/07/15 1234 111/77 mmHg     Pulse Rate 04/07/15 1234 129     Resp 04/07/15 1234 18     Temp 04/07/15 1234 98.3 F (36.8 C)     Temp Source 04/07/15 1234 Oral     SpO2 04/07/15 1234 99 %     Weight 04/07/15 1234 132 lb (59.875 kg)     Height 04/07/15 1234  (1.651 m)     Head Cir --      Peak Flow --      Pain Score 04/07/15 1235 9     Pain Loc --      Pain Edu? --      Excl. in GC? --     Constitutional: She is alert and oriented resting in the bed. She appears mildly uncomfortable but nontoxic.  Eyes: Conjunctivae are normal.  EOMI. no scleral icterus. Head: Atraumatic. Nose: No congestion/rhinnorhea. Mouth/Throat: Mucous membranes are mildly dry..  Neck: No stridor.  Supple.  No meningismus. Cardiovascular: Normal rate, regular rhythm. No murmurs, rubs or gallops.  Respiratory: Normal respiratory effort.  No retractions. Lungs CTAB.  No wheezes, rales or ronchi. Gastrointestinal: Soft and nondistended. Minimally tender in the suprapubic area. No distention. No guarding or rebound, no peritoneal signs. Positive for CVA tenderness bilaterally.  Musculoskeletal: No LE edema.  Neurologic:  Normal speech and language. No gross focal neurologic deficits are appreciated.  Skin:  Skin is warm, dry and intact. No rash noted. Psychiatric: Mood and  affect are normal. Speech and behavior are normal.  Normal judgement.  ____________________________________________   LABS (all labs ordered are listed, but only abnormal results are displayed)  Labs Reviewed  COMPREHENSIVE METABOLIC PANEL - Abnormal; Notable for the following:    CO2 21 (*)    AST 14 (*)    ALT 11 (*)    Total Bilirubin 1.8 (*)    All other  components within normal limits  CBC - Abnormal; Notable for the following:    Platelets 146 (*)    All other components within normal limits  URINALYSIS COMPLETEWITH MICROSCOPIC ([facility] ONLY) - Abnormal; Notable for the following:    Color, Urine AMBER (*)    APPearance TURBID (*)    Ketones, ur 2+ (*)    Specific Gravity, Urine 1.032 (*)    Hgb urine dipstick 1+ (*)    Protein, ur 100 (*)    Leukocytes, UA 2+ (*)    Bacteria, UA MANY (*)    Squamous Epithelial / LPF TOO NUMEROUS TO COUNT (*)    All other components within normal limits  URINE CULTURE  LIPASE, BLOOD  POC URINE PREG, ED  POCT PREGNANCY, URINE   ____________________________________________  EKG  Not indicated ____________________________________________  RADIOLOGY  No results found.  ____________________________________________   PROCEDURES  Procedure(s) performed: None  Critical Care performed: No ____________________________________________   INITIAL IMPRESSION / ASSESSMENT AND PLAN / ED COURSE  Pertinent labs & imaging results that were available during my care of the patient were reviewed by me and considered in my medical decision making (see chart for details).  23 y.o. with several day history of urinary symptoms, now with nausea, vomiting, diarrhea. On exam she does have bilateral CVA tenderness. Her urinalysis from triage does show infection and the most likely etiology of her symptoms is pyelonephritis. It is possible that she also has a urinary tract infection and a viral GI illness, but this would be less likely. I do not see  any evidence for unilateral renal colic, or other surgical intra-abdominal pathology.  ----------------------------------------- 5:04 PM on 04/07/2015 -----------------------------------------  The patient still actively breast feeds her child. Standard treatment for pyelonephritis include Cipro and Levaquin which are contraindicated with breast-feeding. The additional medication that is recommended is Bactrim, to which she is allergic. I will give the patient a dose of Rocephin in the emergency department and have her go home with treatment on Keflex. She understands that this medication may not be strong enough for her symptoms and what the return precautions are in case she is worsening. I have sent a culture that she will have her primary care physician follow-up.  ----------------------------------------- 5:18 PM on 04/07/2015 -----------------------------------------  Patient states that she is feeling much better. Her nausea has completely resolved and her body aches are significantly improved. She has received IV fluid and symptomatically treatment. She has reduced received a dose of ceftriaxone, and I will send her home with 14 days of Keflex. She understands that I am choosing this medication because she is allergic to sulfa, and that Levaquin and ciprofloxacin are contraindicated with breast-feeding. She understands that if she is not improving, that she needs to follow up with a primary care physician or come back to the emergency department as Keflex may not be strong enough for pyelonephritis. ____________________________________________  FINAL CLINICAL IMPRESSION(S) / ED DIAGNOSES  Final diagnoses:  Pyelonephritis  Nausea vomiting and diarrhea  Chills (without fever)      NEW MEDICATIONS STARTED DURING THIS VISIT:  New Prescriptions   CEPHALEXIN (KEFLEX) 500 MG CAPSULE    Take 1 capsule (500 mg total) by mouth 4 (four) times daily.   ONDANSETRON (ZOFRAN ODT) 4 MG  DISINTEGRATING TABLET    Take 1 tablet (4 mg total) by mouth every 8 (eight) hours as needed for nausea or vomiting.   OXYCODONE-ACETAMINOPHEN (ROXICET) 5-325 MG TABLET    Take 1 tablet by mouth  every 6 (six) hours as needed.     Rockne Menghini, MD 04/07/15 1719

## 2015-04-07 NOTE — ED Notes (Signed)
Pt c/o N/V/D, bodyaches with HA since yesterday.

## 2015-04-07 NOTE — Discharge Instructions (Signed)
Please drink plan fluids stay well-hydrated. You may take Tylenol for mild to moderate pain, and Percocet for severe pain. Zofran as for nausea. Please take the entire course of antibiotics even if you're feeling better. It is safe to breast-feed when you're taking Keflex; however, if your symptoms worsen it may be because Keflex is not strong enough for urine infection. I have sent a urinary culture which will determine whether Keflex is a strong enough medication for the type of infection that you have. If your symptoms are not improving, please have your primary care physician follow-up for culture.   Please return to the emergency department if you develop fever, inability to keep down fluids, worsening pain, or any other symptoms concerning to you.

## 2015-04-09 LAB — URINE CULTURE: Special Requests: NORMAL

## 2015-04-21 ENCOUNTER — Emergency Department
Admission: EM | Admit: 2015-04-21 | Discharge: 2015-04-21 | Disposition: A | Discharge status: Home / Self Care | Payer: Medicaid Other | Attending: Emergency Medicine | Admitting: Emergency Medicine

## 2015-04-21 DIAGNOSIS — Z3202 Encounter for pregnancy test, result negative: Secondary | ICD-10-CM | POA: Insufficient documentation

## 2015-04-21 DIAGNOSIS — Z79899 Other long term (current) drug therapy: Secondary | ICD-10-CM | POA: Insufficient documentation

## 2015-04-21 DIAGNOSIS — N72 Inflammatory disease of cervix uteri: Secondary | ICD-10-CM | POA: Insufficient documentation

## 2015-04-21 DIAGNOSIS — N760 Acute vaginitis: Secondary | ICD-10-CM | POA: Insufficient documentation

## 2015-04-21 DIAGNOSIS — R110 Nausea: Secondary | ICD-10-CM | POA: Insufficient documentation

## 2015-04-21 LAB — WET PREP, GENITAL
CLUE CELLS WET PREP: NONE SEEN
Sperm: NONE SEEN
TRICH WET PREP: NONE SEEN
YEAST WET PREP: NONE SEEN

## 2015-04-21 LAB — URINALYSIS COMPLETE WITH MICROSCOPIC (ARMC ONLY)
Bacteria, UA: NONE SEEN
Bilirubin Urine: NEGATIVE
Glucose, UA: NEGATIVE mg/dL
Hgb urine dipstick: NEGATIVE
LEUKOCYTES UA: NEGATIVE
NITRITE: NEGATIVE
PH: 5 (ref 5.0–8.0)
PROTEIN: 30 mg/dL — AB
RBC / HPF: NONE SEEN RBC/hpf (ref 0–5)
Specific Gravity, Urine: 1.044 — ABNORMAL HIGH (ref 1.005–1.030)

## 2015-04-21 LAB — CHLAMYDIA/NGC RT PCR (ARMC ONLY)
Chlamydia Tr: NOT DETECTED
N gonorrhoeae: NOT DETECTED

## 2015-04-21 LAB — PREGNANCY, URINE: Preg Test, Ur: NEGATIVE

## 2015-04-21 MED ORDER — AZITHROMYCIN 250 MG PO TABS
1000.0000 mg | ORAL_TABLET | Freq: Once | ORAL | Status: AC
  Administered 2015-04-21: 1000 mg via ORAL

## 2015-04-21 MED ORDER — CEFTRIAXONE SODIUM 250 MG IJ SOLR
250.0000 mg | Freq: Once | INTRAMUSCULAR | Status: AC
  Administered 2015-04-21: 250 mg via INTRAMUSCULAR

## 2015-04-21 NOTE — ED Notes (Signed)
Pt states she was diagnosed with a kidney infection two weeks ago and was prescribed Cephalexin 500 MG. Per pt she has been taking precsription as ordered without any relief, and states that she has now been experiencing UTI like symptoms with a burning sensation when she urinates as well as a vaginal discharge.

## 2015-04-21 NOTE — ED Provider Notes (Signed)
[facility] Emergency Department Provider Note  ____________________________________________  Time seen: Approximately 5:04 PM  I have reviewed the triage vital signs and the nursing notes.   HISTORY  Chief Complaint Abdominal Pain  HPI [name] is a 24 y.o. female is here with continued complaint of abdominal pain. Patient states she was here in the emergency room 2 weeks ago and diagnosed with a urinary tract infection was prescribed Keflex 500 mg 4 times a day. She states that the next week she was seen by her OB/GYN in [place] and has received information that her Pap smear was normal. She now complains of a burning sensation when she urinates as well as a vaginal discharge which is occasionally yellow. She also complains of low back pain with some nausea. She rates her pain as a 9 out of 10. She denies any diarrhea or active vomiting. She is unaware of any fever or chills.      Past Medical History  Diagnosis Date  . Chest pain, unspecified   . Dyspnea   . Hyperventilation   . Panic attack   . Anxiety and depression   . Malaise and fatigue   . Allergy   . Panic attack   . Anxiety   . Gluten intolerance   . IBS (irritable bowel syndrome)   . Migraine   . Pregnancy induced hypertension   . SVD (spontaneous vaginal delivery) 08/31/2014    Patient Active Problem List   Diagnosis Date Noted  . SVD (spontaneous vaginal delivery) 08/31/2014  . Labor and delivery indication for care or intervention 08/30/2014  . FHR (fetal heart rate) nonreactive   . [redacted] weeks gestation of pregnancy   . Encounter for fetal anatomic survey   . [redacted] weeks gestation of pregnancy   . PANIC ATTACK 10/27/2008  . ANXIETY DEPRESSION 10/27/2008    Past Surgical History  Procedure Laterality Date  . Tonsillectomy    . Adenoidectomy    . Wisdom tooth extraction      Current Outpatient Rx  Name  Route  Sig  Dispense  Refill  . acetaminophen (TYLENOL) 500 MG  tablet   Oral   Take 1,000 mg by mouth every 6 (six) hours as needed for moderate pain.         . calcium carbonate (TUMS - DOSED IN MG ELEMENTAL CALCIUM) 500 MG chewable tablet   Oral   Chew 2 tablets by mouth 3 (three) times daily as needed for indigestion or heartburn.         . cyclobenzaprine (FLEXERIL) 10 MG tablet   Oral   Take 1 tablet (10 mg total) by mouth at bedtime.   30 tablet   0   . diphenhydrAMINE (BENADRYL) 25 MG tablet   Oral   Take 25 mg by mouth at bedtime as needed for sleep.         Marland Kitchen. Doxylamine-Pyridoxine 10-10 MG TBEC   Oral   Take 2 tablets by mouth at bedtime.   60 tablet   1   . ibuprofen (ADVIL,MOTRIN) 800 MG tablet   Oral   Take 1 tablet (800 mg total) by mouth every 8 (eight) hours as needed.   45 tablet   1   . ondansetron (ZOFRAN ODT) 4 MG disintegrating tablet   Oral   Take 1 tablet (4 mg total) by mouth every 8 (eight) hours as needed for nausea or vomiting.   20 tablet   0   . oxyCODONE-acetaminophen (ROXICET)  5-325 MG tablet   Oral   Take 1 tablet by mouth every 6 (six) hours as needed.   12 tablet   0   . Prenatal Vit-Min-FA-Fish Oil ([employer] PRENATAL GUMMY) 0.4-113.5 MG CHEW   Oral   Chew 2 tablets by mouth daily.   60 tablet   12   . ranitidine (ZANTAC) 150 MG tablet   Oral   Take 150 mg by mouth 2 (two) times daily.           Allergies Hydrocodone and Sulfa antibiotics  Family History  Problem Relation Age of Onset  . Hypertension Mother   . Cancer Mother   . Heart disease Maternal Grandfather     Social History Social History  Substance Use Topics  . Smoking status: Never Smoker   . Smokeless tobacco: Never Used  . Alcohol Use: No    Review of Systems Constitutional: No fever/chills Eyes: No visual changes. ENT: No sore throat. Cardiovascular: Denies chest pain. Respiratory: Denies shortness of breath. GI:   Positive abdominal pain.   positive nausea, no vomiting.  No diarrhea.  No  constipation. Gen:  Positive dysuria. Muscleskeletal: Positive for low back pain  Skin: No rash  Neurological: Negative for headaches, focal weakness or numbness.  10-point ROS otherwise negative.  ____________________________________________   PHYSICAL EXAM:  VITAL SIGNS: ED Triage Vitals  Enc Vitals Group     BP 04/21/15 1552 111/70 mmHg     Pulse Rate 04/21/15 1552 81     Resp 04/21/15 1552 16     Temp 04/21/15 1552 98.4 F (36.9 C)     Temp Source 04/21/15 1552 Oral     SpO2 04/21/15 1552 100 %     Weight 04/21/15 1552 128 lb (58.06 kg)     Height 04/21/15 1552  (1.651 m)     Head Cir --      Peak Flow --      Pain Score 04/21/15 1553 9     Pain Loc --      Pain Edu? --      Excl. in GC? --     Constitutional: Alert and oriented. Well appearing and in no acute distress. Eyes: Conjunctivae are normal. PERRL. EOMI. Head: Atraumatic. Nose: No congestion/rhinnorhea. Mouth/Throat: Mucous membranes are moist.  Oropharynx non-erythematous. Neck: No stridor.   Cardiovascular: Normal rate, regular rhythm. Grossly normal heart sounds.  Good peripheral circulation. Respiratory: Normal respiratory effort.  No retractions. Lungs CTAB. Gastrointestinal: Soft. No distention. No abdominal bruits. No CVA tenderness. There is diffuse tenderness on palpation of the suprapubic area. Bowel sounds are normal 4 quadrants. Genitourinary: Pelvic exam there is moderate amount of white secretions noted in the vaginal vault. Wet prep was obtained. There is some minimal adnexal tenderness bilaterally without masses noted. There is also some minimal cervical motion tenderness present. Musculoskeletal: No lower extremity tenderness nor edema.  No joint effusions. Neurologic:  Normal speech and language. No gross focal neurologic deficits are appreciated. No gait instability. Skin:  Skin is warm, dry and intact. No rash noted. Psychiatric: Mood and affect are normal. Speech and behavior are  normal.  ____________________________________________   LABS (all labs ordered are listed, but only abnormal results are displayed)  Labs Reviewed  WET PREP, GENITAL - Abnormal; Notable for the following:    WBC, Wet Prep HPF POC RARE (*)    All other components within normal limits  URINALYSIS COMPLETEWITH MICROSCOPIC ([facility] ONLY) - Abnormal; Notable for the following:  Color, Urine YELLOW (*)    APPearance CLOUDY (*)    Ketones, ur TRACE (*)    Specific Gravity, Urine 1.044 (*)    Protein, ur 30 (*)    Squamous Epithelial / LPF TOO NUMEROUS TO COUNT (*)    All other components within normal limits  CHLAMYDIA/NGC RT PCR ([facility] ONLY)  PREGNANCY, URINE    PROCEDURES  Procedure(s) performed: None  Critical Care performed: No  ____________________________________________   INITIAL IMPRESSION / ASSESSMENT AND PLAN / ED COURSE  Pertinent labs & imaging results that were available during my care of the patient were reviewed by me and considered in my medical decision making (see chart for details).  Patient was given Rocephin 250 mg IM while in the emergency room along with a gram of Zithromax. Patient is to follow-up with her OB/GYN in [place] if any continued problems. She is also told to discontinue taking Keflex as she does not have a urinary tract infection tonight. ____________________________________________   FINAL CLINICAL IMPRESSION(S) / ED DIAGNOSES  Final diagnoses:  Vaginitis  Acute cervicitis      Tommi Rumps, PA-C 04/21/15 2332  Jeanmarie Plant, MD 04/21/15 [phone]

## 2015-04-21 NOTE — Discharge Instructions (Signed)
Follow-up with your OB/GYN in [place] or Dr. Tania Adehrisman's office if any continued problems. You have given medication while in the emergency room which should help with your symptoms. You may discontinue taking Keflex at this time.

## 2015-04-21 NOTE — ED Notes (Signed)
Patient with lower abdominal pain and lower back pain.  Patient reports that pain has been ongoing for 2 weeks and was treated 2 weeks ago for "kidney infection".

## 2015-05-17 ENCOUNTER — Emergency Department (HOSPITAL_COMMUNITY): Payer: Medicaid Other

## 2015-05-17 ENCOUNTER — Emergency Department (HOSPITAL_COMMUNITY)
Admission: EM | Admit: 2015-05-17 | Discharge: 2015-05-17 | Disposition: A | Discharge status: Home / Self Care | Payer: Medicaid Other | Attending: Emergency Medicine | Admitting: Emergency Medicine

## 2015-05-17 ENCOUNTER — Encounter (HOSPITAL_COMMUNITY): Payer: Self-pay | Admitting: Emergency Medicine

## 2015-05-17 DIAGNOSIS — B349 Viral infection, unspecified: Secondary | ICD-10-CM | POA: Insufficient documentation

## 2015-05-17 DIAGNOSIS — G43909 Migraine, unspecified, not intractable, without status migrainosus: Secondary | ICD-10-CM | POA: Insufficient documentation

## 2015-05-17 DIAGNOSIS — F419 Anxiety disorder, unspecified: Secondary | ICD-10-CM | POA: Insufficient documentation

## 2015-05-17 DIAGNOSIS — R05 Cough: Secondary | ICD-10-CM

## 2015-05-17 DIAGNOSIS — R000 Tachycardia, unspecified: Secondary | ICD-10-CM | POA: Insufficient documentation

## 2015-05-17 DIAGNOSIS — F329 Major depressive disorder, single episode, unspecified: Secondary | ICD-10-CM | POA: Insufficient documentation

## 2015-05-17 DIAGNOSIS — N898 Other specified noninflammatory disorders of vagina: Secondary | ICD-10-CM | POA: Insufficient documentation

## 2015-05-17 DIAGNOSIS — R059 Cough, unspecified: Secondary | ICD-10-CM

## 2015-05-17 DIAGNOSIS — Z79899 Other long term (current) drug therapy: Secondary | ICD-10-CM | POA: Insufficient documentation

## 2015-05-17 LAB — WET PREP, GENITAL
CLUE CELLS WET PREP: NONE SEEN
Sperm: NONE SEEN
TRICH WET PREP: NONE SEEN
YEAST WET PREP: NONE SEEN

## 2015-05-17 MED ORDER — BENZONATATE 100 MG PO CAPS
100.0000 mg | ORAL_CAPSULE | Freq: Once | ORAL | Status: AC
  Administered 2015-05-17: 100 mg via ORAL
  Filled 2015-05-17: qty 1

## 2015-05-17 MED ORDER — GUAIFENESIN 100 MG/5ML PO LIQD: 100.0000 mg | ORAL | Status: DC | PRN

## 2015-05-17 MED ORDER — BENZONATATE 100 MG PO CAPS: 100.0000 mg | ORAL_CAPSULE | Freq: Three times a day (TID) | ORAL | Status: DC

## 2015-05-17 MED ORDER — LIDOCAINE HCL (PF) 1 % IJ SOLN
INTRAMUSCULAR | Status: AC
  Administered 2015-05-17: 5 mL
  Filled 2015-05-17: qty 5

## 2015-05-17 MED ORDER — AZITHROMYCIN 250 MG PO TABS
1000.0000 mg | ORAL_TABLET | Freq: Once | ORAL | Status: AC
  Administered 2015-05-17: 1000 mg via ORAL
  Filled 2015-05-17: qty 4

## 2015-05-17 MED ORDER — SODIUM CHLORIDE 0.9 % IV BOLUS (SEPSIS)
1000.0000 mL | Freq: Once | INTRAVENOUS | Status: AC
  Administered 2015-05-17: 1000 mL via INTRAVENOUS

## 2015-05-17 MED ORDER — CEFTRIAXONE SODIUM 250 MG IJ SOLR
250.0000 mg | Freq: Once | INTRAMUSCULAR | Status: AC
  Administered 2015-05-17: 250 mg via INTRAMUSCULAR
  Filled 2015-05-17: qty 250

## 2015-05-17 NOTE — ED Notes (Signed)
Pt presents with nasal congestion, productive cough, and vaginal discharge; pt reports cold like symptoms x 7 days and vaginal discharge x 1 month;

## 2015-05-17 NOTE — ED Notes (Signed)
Patient transported to X-ray 

## 2015-05-17 NOTE — Discharge Instructions (Signed)
Viral Infections A viral infection can be caused by different types of viruses.Most viral infections are not serious and resolve on their own. However, some infections may cause severe symptoms and may lead to further complications. SYMPTOMS Viruses can frequently cause:  Minor sore throat.  Aches and pains.  Headaches.  Runny nose.  Different types of rashes.  Watery eyes.  Tiredness.  Cough.  Loss of appetite.  Gastrointestinal infections, resulting in nausea, vomiting, and diarrhea. These symptoms do not respond to antibiotics because the infection is not caused by bacteria. However, you might catch a bacterial infection following the viral infection. This is sometimes called a "superinfection." Symptoms of such a bacterial infection may include:  Worsening sore throat with pus and difficulty swallowing.  Swollen neck glands.  Chills and a high or persistent fever.  Severe headache.  Tenderness over the sinuses.  Persistent overall ill feeling (malaise), muscle aches, and tiredness (fatigue).  Persistent cough.  Yellow, green, or brown mucus production with coughing. HOME CARE INSTRUCTIONS   Only take over-the-counter or prescription medicines for pain, discomfort, diarrhea, or fever as directed by your caregiver.  Drink enough water and fluids to keep your urine clear or pale yellow. Sports drinks can provide valuable electrolytes, sugars, and hydration.  Get plenty of rest and maintain proper nutrition. Soups and broths with crackers or rice are fine. SEEK IMMEDIATE MEDICAL CARE IF:   You have severe headaches, shortness of breath, chest pain, neck pain, or an unusual rash.  You have uncontrolled vomiting, diarrhea, or you are unable to keep down fluids.  You or your child has an oral temperature above 102 F (38.9 C), not controlled by medicine.  Your baby is older than 3 months with a rectal temperature of 102 F (38.9 C) or higher.  Your baby is 58  months old or younger with a rectal temperature of 100.4 F (38 C) or higher. MAKE SURE YOU:   Understand these instructions.  Will watch your condition.  Will get help right away if you are not doing well or get worse.   This information is not intended to replace advice given to you by your health care provider. Make sure you discuss any questions you have with your health care provider.   Document Released: 02/15/2005 Document Revised: 07/31/2011 Document Reviewed: 10/14/2014 Elsevier Interactive Patient Education 2016 Elsevier Inc. Cervicitis Cervicitis is a soreness and swelling (inflammation) of the cervix. Your cervix is located at the bottom of your uterus. It opens up to the vagina. CAUSES   Sexually transmitted infections (STIs).   Allergic reaction.   Medicines or birth control devices that are put in the vagina.   Injury to the cervix.   Bacterial infections.  RISK FACTORS You are at greater risk if you:  Have unprotected sexual intercourse.  Have sexual intercourse with many partners.  Began sexual intercourse at an early age.  Have a history of STIs. SYMPTOMS  There may be no symptoms. If symptoms occur, they may include:   Gray, white, yellow, or bad-smelling vaginal discharge.   Pain or itching of the area outside the vagina.   Painful sexual intercourse.   Lower abdominal or lower back pain, especially during intercourse.   Frequent urination.   Abnormal vaginal bleeding between periods, after sexual intercourse, or after menopause.   Pressure or a heavy feeling in the pelvis.  DIAGNOSIS  Diagnosis is made after a pelvic exam. Other tests may include:   Examination of any discharge under a  microscope (wet prep).   A Pap test.  TREATMENT  Treatment will depend on the cause of cervicitis. If it is caused by an STI, both you and your partner will need to be treated. Antibiotic medicines will be given.  HOME CARE INSTRUCTIONS     Do not have sexual intercourse until your health care provider says it is okay.   Do not have sexual intercourse until your partner has been treated, if your cervicitis is caused by an STI.   Take your antibiotics as directed. Finish them even if you start to feel better.  SEEK MEDICAL CARE IF:  Your symptoms come back.   You have a fever.  MAKE SURE YOU:   Understand these instructions.  Will watch your condition.  Will get help right away if you are not doing well or get worse.   This information is not intended to replace advice given to you by your health care provider. Make sure you discuss any questions you have with your health care provider.   Document Released: 05/08/2005 Document Revised: 05/13/2013 Document Reviewed: 10/30/2012 Elsevier Interactive Patient Education [employer] Elsevier Inc.

## 2015-05-17 NOTE — ED Provider Notes (Signed)
CSN: [id number]     Arrival date & time 05/17/15  0531 History   First MD Initiated Contact with Patient 05/17/15 0602     Chief Complaint  Patient presents with  . Cough  . Nasal Congestion  . Vaginal Discharge     (Consider location/radiation/quality/duration/timing/severity/associated sxs/prior Treatment) HPI   24 year old female with history of anxiety and depression, IBS, allergies presents with multiple complaints. Patient reports for the past week she has had URI symptoms including headache, nasal congestions, sneezing, coughing, runny nose. States she has greenish nasal discharge and greenish productive cough. Her 27-month-old son has similar symptoms. She is currently breast-feeding therefore she is limited in her home remedy. She is currently drinking tea with honey, taking Tylenol without adequate relief. Furthermore, she also complaining of cloudy urine and now vaginal discharge intermittently for the past month. Endorse low abdominal cramping, mild in severity. Symptoms felt similar to prior yeast infection. Patient denies having fever, shortness of breath, back pain, dysuria, hematuria, vaginal bleeding, or rash.  Past Medical History  Diagnosis Date  . Chest pain, unspecified   . Dyspnea   . Hyperventilation   . Panic attack   . Anxiety and depression   . Malaise and fatigue   . Allergy   . Panic attack   . Anxiety   . Gluten intolerance   . IBS (irritable bowel syndrome)   . Migraine   . Pregnancy induced hypertension   . SVD (spontaneous vaginal delivery) 08/31/2014   Past Surgical History  Procedure Laterality Date  . Tonsillectomy    . Adenoidectomy    . Wisdom tooth extraction     Family History  Problem Relation Age of Onset  . Hypertension Mother   . Cancer Mother   . Heart disease Maternal Grandfather    Social History  Substance Use Topics  . Smoking status: Never Smoker   . Smokeless tobacco: Never Used  . Alcohol Use: No   OB History    Gravida Para Term Preterm AB TAB SAB Ectopic Multiple Living   0 1     Review of Systems  All other systems reviewed and are negative.     Allergies  Hydrocodone and Sulfa antibiotics  Home Medications   Prior to Admission medications   Medication Sig Start Date End Date Taking? Authorizing Provider  acetaminophen (TYLENOL) 500 MG tablet Take 1,000 mg by mouth every 6 (six) hours as needed for moderate pain.    Historical Provider, MD  calcium carbonate (TUMS - DOSED IN MG ELEMENTAL CALCIUM) 500 MG chewable tablet Chew 2 tablets by mouth 3 (three) times daily as needed for indigestion or heartburn.    Historical Provider, MD  cyclobenzaprine (FLEXERIL) 10 MG tablet Take 1 tablet (10 mg total) by mouth at bedtime. 08/24/14   Marlis Edelson, CNM  diphenhydrAMINE (BENADRYL) 25 MG tablet Take 25 mg by mouth at bedtime as needed for sleep.    Historical Provider, MD  Doxylamine-Pyridoxine 10-10 MG TBEC Take 2 tablets by mouth at bedtime. 08/24/14   Marlis Edelson, CNM  ibuprofen (ADVIL,MOTRIN) 800 MG tablet Take 1 tablet (800 mg total) by mouth every 8 (eight) hours as needed. 09/01/14   Jody Bovard-Stuckert, MD  ondansetron (ZOFRAN ODT) 4 MG disintegrating tablet Take 1 tablet (4 mg total) by mouth every 8 (eight) hours as needed for nausea or vomiting. 04/07/15   Rockne Menghini, MD  oxyCODONE-acetaminophen (ROXICET) 5-325 MG tablet Take 1 tablet  by mouth every 6 (six) hours as needed. 04/07/15 04/06/16  Rockne MenghiniAnne-Caroline Norman, MD  Prenatal Vit-Min-FA-Fish Oil ([employer] PRENATAL GUMMY) 0.4-113.5 MG CHEW Chew 2 tablets by mouth daily. 09/01/14   Sherian ReinJody Bovard-Stuckert, MD  ranitidine (ZANTAC) 150 MG tablet Take 150 mg by mouth 2 (two) times daily.    Historical Provider, MD   BP 134/101 mmHg  Pulse 125  Temp(Src) 98.1 F (36.7 C) (Oral)  Resp 20  Ht 5\' 5"  (1.651 m)  Wt 58.06 kg  BMI 21.30 kg/m2  SpO2 98% Physical Exam  Constitutional: She appears well-developed and  well-nourished. No distress.  HENT:  Head: Atraumatic.  Ears: Normal TM  Nose: Rhinorrhea  Throat: Uvula is midline, no tonsillar enlargement or exudates  Eyes: Conjunctivae are normal.  Neck: Neck supple.  No nuchal rigidity  Cardiovascular:  Tachycardia without murmur rubs or gallops  Pulmonary/Chest: Effort normal and breath sounds normal. No respiratory distress. She has no wheezes. She has no rales.  Abdominal: Soft. There is tenderness (Mild diffuse abdominal tenderness without guarding or rebound tenderness).  Genitourinary:  Chaperone present during exam. No inguinal lymphadenopathy or inguinal hernia noted. Normal external genitalia. No pain with speculum insertion. Vaginal vault appears normal. Mild discharge noted at the cervical os which is closed. On bimanual examination, mild bilateral adnexal tenderness without cervical motion tenderness.  Neurological: She is alert.  Skin: No rash noted.  Psychiatric: She has a normal mood and affect.  Nursing note and vitals reviewed.   ED Course  Procedures (including critical care time) Labs Review Labs Reviewed  WET PREP, GENITAL - Abnormal; Notable for the following:    WBC, Wet Prep HPF POC MANY (*)    All other components within normal limits  GC/CHLAMYDIA PROBE AMP ([place]) NOT AT [facility]    Imaging Review Dg Chest 2 View  05/17/2015  CLINICAL DATA:  Acute onset of shortness of breath, cough and nausea. Initial encounter. EXAM: CHEST  2 VIEW COMPARISON:  Chest radiograph performed 10/06/2005 FINDINGS: The lungs are well-aerated and clear. There is no evidence of focal opacification, pleural effusion or pneumothorax. The heart is normal in size; the mediastinal contour is within normal limits. No acute osseous abnormalities are seen. IMPRESSION: No acute cardiopulmonary process seen. Electronically Signed   By: Roanna RaiderJeffery  Chang M.D.   On: 05/17/2015 06:42   I have personally reviewed and evaluated these images and lab  results as part of my medical decision-making.   EKG Interpretation None      MDM   Final diagnoses:  Cough  Viral infection  Vaginal discharge    BP 117/86 mmHg  Pulse 94  Temp(Src) 98.1 F (36.7 C) (Oral)  Resp 19  Ht 5\' 5"  (1.651 m)  Wt 58.06 kg  BMI 21.30 kg/m2  SpO2 99%  Breastfeeding? Yes   6:18 AM Patient with URI symptoms. Does have productive cough, will obtain chest x-ray to rule out pneumonia. She also complaining of vaginal discharge. Similar vaginal discharge that was evaluated less than a month ago which shows no evidence of STD. Plan to perform pelvic exam.  8:17 AM Patient does have mild bilateral adnexal tenderness on exam which she has similar discomfort from the last pelvic examination less than a month ago when her GC and Chlamydia was negative. I do not think patient has PID. I will await culture before treatment.  8:43 AM Wet prep showing many WBC.  However, given that pt has similar sxs on 11/30, diagnosed with acute cervicitis and  did received rocephin/zithromax and sxs did not improve, I will hold off additional abx until cultures resulted.  Pt however request for treatment, therefore treatment given for suspected cervicitis.    Fayrene Helper, PA-C 05/17/15 1610  Shon Baton, MD 05/17/15 [phone]

## 2015-05-21 LAB — GC/CHLAMYDIA PROBE AMP (~~LOC~~) NOT AT ARMC
CHLAMYDIA, DNA PROBE: NEGATIVE
NEISSERIA GONORRHEA: NEGATIVE

## 2015-06-18 ENCOUNTER — Emergency Department (HOSPITAL_COMMUNITY): Payer: Medicaid Other

## 2015-06-18 ENCOUNTER — Emergency Department (HOSPITAL_COMMUNITY)
Admission: EM | Admit: 2015-06-18 | Discharge: 2015-06-18 | Disposition: A | Discharge status: Home / Self Care | Payer: Medicaid Other | Attending: Emergency Medicine | Admitting: Emergency Medicine

## 2015-06-18 ENCOUNTER — Encounter (HOSPITAL_COMMUNITY): Payer: Self-pay | Admitting: Cardiology

## 2015-06-18 DIAGNOSIS — R200 Anesthesia of skin: Secondary | ICD-10-CM | POA: Insufficient documentation

## 2015-06-18 DIAGNOSIS — F410 Panic disorder [episodic paroxysmal anxiety] without agoraphobia: Secondary | ICD-10-CM | POA: Insufficient documentation

## 2015-06-18 DIAGNOSIS — R300 Dysuria: Secondary | ICD-10-CM | POA: Insufficient documentation

## 2015-06-18 DIAGNOSIS — R0789 Other chest pain: Secondary | ICD-10-CM | POA: Insufficient documentation

## 2015-06-18 DIAGNOSIS — N939 Abnormal uterine and vaginal bleeding, unspecified: Secondary | ICD-10-CM | POA: Insufficient documentation

## 2015-06-18 DIAGNOSIS — F329 Major depressive disorder, single episode, unspecified: Secondary | ICD-10-CM | POA: Insufficient documentation

## 2015-06-18 DIAGNOSIS — R0602 Shortness of breath: Secondary | ICD-10-CM | POA: Insufficient documentation

## 2015-06-18 DIAGNOSIS — N898 Other specified noninflammatory disorders of vagina: Secondary | ICD-10-CM | POA: Insufficient documentation

## 2015-06-18 DIAGNOSIS — Z8679 Personal history of other diseases of the circulatory system: Secondary | ICD-10-CM | POA: Insufficient documentation

## 2015-06-18 DIAGNOSIS — Z793 Long term (current) use of hormonal contraceptives: Secondary | ICD-10-CM | POA: Insufficient documentation

## 2015-06-18 DIAGNOSIS — R1030 Lower abdominal pain, unspecified: Secondary | ICD-10-CM | POA: Insufficient documentation

## 2015-06-18 DIAGNOSIS — R002 Palpitations: Secondary | ICD-10-CM | POA: Insufficient documentation

## 2015-06-18 DIAGNOSIS — R110 Nausea: Secondary | ICD-10-CM | POA: Insufficient documentation

## 2015-06-18 DIAGNOSIS — R42 Dizziness and giddiness: Secondary | ICD-10-CM | POA: Insufficient documentation

## 2015-06-18 DIAGNOSIS — Z8719 Personal history of other diseases of the digestive system: Secondary | ICD-10-CM | POA: Insufficient documentation

## 2015-06-18 DIAGNOSIS — R079 Chest pain, unspecified: Secondary | ICD-10-CM

## 2015-06-18 DIAGNOSIS — Z79899 Other long term (current) drug therapy: Secondary | ICD-10-CM | POA: Insufficient documentation

## 2015-06-18 LAB — URINE MICROSCOPIC-ADD ON

## 2015-06-18 LAB — HEPATIC FUNCTION PANEL
ALBUMIN: 4.2 g/dL (ref 3.5–5.0)
ALT: 14 U/L (ref 14–54)
AST: 17 U/L (ref 15–41)
Alkaline Phosphatase: 73 U/L (ref 38–126)
BILIRUBIN TOTAL: 0.6 mg/dL (ref 0.3–1.2)
Bilirubin, Direct: <0.1 mg/dL — ABNORMAL LOW (ref 0.1–0.5)
Total Protein: 7.8 g/dL (ref 6.5–8.1)

## 2015-06-18 LAB — URINALYSIS, ROUTINE W REFLEX MICROSCOPIC
Bilirubin Urine: NEGATIVE
GLUCOSE, UA: NEGATIVE mg/dL
Ketones, ur: NEGATIVE mg/dL
LEUKOCYTES UA: NEGATIVE
Nitrite: NEGATIVE
PH: 8 (ref 5.0–8.0)
PROTEIN: NEGATIVE mg/dL
SPECIFIC GRAVITY, URINE: 1.028 (ref 1.005–1.030)

## 2015-06-18 LAB — BASIC METABOLIC PANEL
ANION GAP: 13 (ref 5–15)
BUN: 11 mg/dL (ref 6–20)
CHLORIDE: 108 mmol/L (ref 101–111)
CO2: 21 mmol/L — AB (ref 22–32)
Calcium: 10.5 mg/dL — ABNORMAL HIGH (ref 8.9–10.3)
Creatinine, Ser: 0.64 mg/dL (ref 0.44–1.00)
GFR calc Af Amer: >60 mL/min (ref >60)
GFR calc non Af Amer: >60 mL/min (ref >60)
GLUCOSE: 78 mg/dL (ref 65–99)
POTASSIUM: 4.3 mmol/L (ref 3.5–5.1)
Sodium: 142 mmol/L (ref 135–145)

## 2015-06-18 LAB — I-STAT TROPONIN, ED: Troponin i, poc: 0 ng/mL (ref 0.00–0.08)

## 2015-06-18 LAB — CBC
HEMATOCRIT: 41.8 % (ref 36.0–46.0)
HEMOGLOBIN: 14.6 g/dL (ref 12.0–15.0)
MCH: 31.6 pg (ref 26.0–34.0)
MCHC: 34.9 g/dL (ref 30.0–36.0)
MCV: 90.5 fL (ref 78.0–100.0)
Platelets: 216 10*3/uL (ref 150–400)
RBC: 4.62 MIL/uL (ref 3.87–5.11)
RDW: 13.2 % (ref 11.5–15.5)
WBC: 6.8 10*3/uL (ref 4.0–10.5)

## 2015-06-18 LAB — LIPASE, BLOOD: Lipase: 28 U/L (ref 11–51)

## 2015-06-18 MED ORDER — LORAZEPAM 1 MG PO TABS: 1.0000 mg | ORAL_TABLET | Freq: Three times a day (TID) | ORAL | Status: DC | PRN

## 2015-06-18 MED ORDER — LORAZEPAM 1 MG PO TABS
1.0000 mg | ORAL_TABLET | Freq: Once | ORAL | Status: AC
  Administered 2015-06-18: 1 mg via ORAL
  Filled 2015-06-18: qty 1

## 2015-06-18 MED ORDER — ONDANSETRON 4 MG PO TBDP
8.0000 mg | ORAL_TABLET | Freq: Once | ORAL | Status: AC
  Administered 2015-06-18: 8 mg via ORAL
  Filled 2015-06-18: qty 2

## 2015-06-18 NOTE — ED Notes (Signed)
Pt stable, ambulatory, states understanding of discharge instructions 

## 2015-06-18 NOTE — ED Provider Notes (Signed)
CSN: [id number]     Arrival date & time 06/18/15  1138 History   First MD Initiated Contact with Patient 06/18/15 1353     Chief Complaint  Patient presents with  . Chest Pain  . Numbness     (Consider location/radiation/quality/duration/timing/severity/associated sxs/prior Treatment) HPI   Patient is a 25 year old female past medical history of anxiety and depression who presents the ED with complaint of chest pain, onset 9 AM. Patient reports having constant sharp left-sided chest pain without radiation, she notes the pain is worsened with deep breathing. Endorses associated lightheadedness, nausea, palpitations and shortness of breath. She also reports having numbness to her left arm. Endorses intermittent sharp pain to her lower abdomen, denies any aggravating or alleviating factors. Patient also reports she has had vaginal discharge with dysuria for the past month. She notes she was seen by her OB/GYN last week for her abdominal pain and vaginal d/c and notes her "vaginal swabs" were negative. Denies fever, chills, headache, visual changes, sore throat, cough, wheezing, vomiting, diarrhea, blood in urine or stool, weakness, syncope or seizure. Pt notes she started taking Zoloft 3 days ago for her anxiety and notes she took her last dose this morning at 8am. She notes she started her menstrual cycle 2 days ago. She notes she currently has a Nuvaring. Denies recent surgery/immobilization/trauma, hx of DVT/PE, leg swelling, hemoptysis.   Past Medical History  Diagnosis Date  . Chest pain, unspecified   . Dyspnea   . Hyperventilation   . Panic attack   . Anxiety and depression   . Malaise and fatigue   . Allergy   . Panic attack   . Anxiety   . Gluten intolerance   . IBS (irritable bowel syndrome)   . Migraine   . Pregnancy induced hypertension   . SVD (spontaneous vaginal delivery) 08/31/2014   Past Surgical History  Procedure Laterality Date  . Tonsillectomy    . Adenoidectomy     . Wisdom tooth extraction     Family History  Problem Relation Age of Onset  . Hypertension Mother   . Cancer Mother   . Heart disease Maternal Grandfather    Social History  Substance Use Topics  . Smoking status: Never Smoker   . Smokeless tobacco: Never Used  . Alcohol Use: No   OB History    Gravida Para Term Preterm AB TAB SAB Ectopic Multiple Living   0 1     Review of Systems  Respiratory: Positive for shortness of breath.   Cardiovascular: Positive for chest pain.  Gastrointestinal: Positive for nausea and abdominal pain.  Genitourinary: Positive for vaginal bleeding and vaginal discharge.  Neurological: Positive for light-headedness and numbness.  All other systems reviewed and are negative.     Allergies  Hydrocodone and Sulfa antibiotics  Home Medications   Prior to Admission medications   Medication Sig Start Date End Date Taking? Authorizing Provider  acetaminophen (TYLENOL) 500 MG tablet Take 1,000 mg by mouth every 6 (six) hours as needed for moderate pain.   Yes Historical Provider, MD  calcium carbonate (TUMS - DOSED IN MG ELEMENTAL CALCIUM) 500 MG chewable tablet Chew 2 tablets by mouth 3 (three) times daily as needed for indigestion or heartburn.   Yes Historical Provider, MD  diphenhydrAMINE (BENADRYL) 25 MG tablet Take 25 mg by mouth at bedtime as needed for sleep.   Yes Historical Provider, MD  etonogestrel-ethinyl estradiol (NUVARING) 0.12-0.015 MG/24HR vaginal ring  Place 1 each vaginally every 28 (twenty-eight) days. Insert vaginally and leave in place for 3 consecutive weeks, then remove for 1 week.   Yes Historical Provider, MD  sertraline (ZOLOFT) 100 MG tablet Take 100 mg by mouth daily.   Yes Historical Provider, MD  benzonatate (TESSALON) 100 MG capsule Take 1 capsule (100 mg total) by mouth every 8 (eight) hours. 05/17/15   Fayrene Helper, PA-C  guaiFENesin (ROBITUSSIN) 100 MG/5ML liquid Take 5-10 mLs (100-200 mg total) by mouth every  4 (four) hours as needed for cough. 05/17/15   Fayrene Helper, PA-C  LORazepam (ATIVAN) 1 MG tablet Take 1 tablet (1 mg total) by mouth 3 (three) times daily as needed for anxiety. 06/18/15   Barrett Henle, PA-C  norethindrone (NORA-BE) 0.35 MG tablet Take 1 tablet by mouth daily.    Historical Provider, MD   BP 116/79 mmHg  Pulse 61  Temp(Src) 98.2 F (36.8 C) (Oral)  Resp 17  Ht  (1.651 m)  Wt 58.968 kg  BMI 21.63 kg/m2  SpO2 99%  LMP 06/16/2015 Physical Exam  Constitutional: She is oriented to person, place, and time. She appears well-developed and well-nourished.  Pt appears anxious  HENT:  Head: Normocephalic and atraumatic.  Right Ear: Tympanic membrane normal.  Left Ear: Tympanic membrane normal.  Nose: Nose normal.  Mouth/Throat: Uvula is midline, oropharynx is clear and moist and mucous membranes are normal. No oropharyngeal exudate.  Eyes: Conjunctivae and EOM are normal. Pupils are equal, round, and reactive to light. Right eye exhibits no discharge. Left eye exhibits no discharge. No scleral icterus.  Neck: Normal range of motion. Neck supple.  Cardiovascular: Normal rate, regular rhythm, normal heart sounds and intact distal pulses.   Pulmonary/Chest: Effort normal and breath sounds normal. No respiratory distress. She has no wheezes. She has no rales. She exhibits tenderness (left chest wall mildly TTP).  Abdominal: Soft. Bowel sounds are normal. She exhibits no distension and no mass. There is tenderness (lower abdominal tenderness). There is no rebound and no guarding.  Musculoskeletal: Normal range of motion. She exhibits no edema.  FROM of BUE and BLE with 5/5 strength. Sensation grossly intact. 2+ radial pulses.   Lymphadenopathy:    She has no cervical adenopathy.  Neurological: She is alert and oriented to person, place, and time. She has normal strength. No cranial nerve deficit or sensory deficit. She displays a negative Romberg sign. Coordination  normal.  Skin: Skin is warm and dry.  Nursing note and vitals reviewed.   ED Course  Procedures (including critical care time) Labs Review Labs Reviewed  BASIC METABOLIC PANEL - Abnormal; Notable for the following:    CO2 21 (*)    Calcium 10.5 (*)    All other components within normal limits  URINALYSIS, ROUTINE W REFLEX MICROSCOPIC (NOT AT [facility]) - Abnormal; Notable for the following:    APPearance TURBID (*)    Hgb urine dipstick LARGE (*)    All other components within normal limits  HEPATIC FUNCTION PANEL - Abnormal; Notable for the following:    Bilirubin, Direct <0.1 (*)    All other components within normal limits  URINE MICROSCOPIC-ADD ON - Abnormal; Notable for the following:    Squamous Epithelial / LPF 0-5 (*)    Bacteria, UA RARE (*)    Casts HYALINE CASTS (*)    All other components within normal limits  CBC  LIPASE, BLOOD  I-STAT TROPOININ, ED    Imaging Review Dg Chest 2  View  06/18/2015  CLINICAL DATA:  Chest pain and shortness of breath for 1 day EXAM: CHEST  2 VIEW COMPARISON:  05/17/2015 FINDINGS: The heart size and mediastinal contours are within normal limits. Both lungs are clear. The visualized skeletal structures are unremarkable. IMPRESSION: No active cardiopulmonary disease. Electronically Signed   By: Alcide Clever M.D.   On: 06/18/2015 12:30   I have personally reviewed and evaluated these images and lab results as part of my medical decision-making.   EKG Interpretation   Date/Time:  Friday June 18 2015 11:46:53 EST Ventricular Rate:  99 PR Interval:  106 QRS Duration: 74 QT Interval:  330 QTC Calculation: 423 R Axis:   90 Text Interpretation:  Sinus rhythm with sinus arrhythmia with short PR  Rightward axis Borderline ECG paced rhythm No significant change since  last tracing Confirmed by [facility] MD, Barbara Cower [phone]) on 06/18/2015 2:05:18 PM      MDM   Final diagnoses:  Chest pain, unspecified chest pain type    Patient presents  with chest pain, left arm weakness, shortness of breath, lightheadedness, nausea and abdominal pain. She also endorses having vaginal d/c and dysuria for the past month which she has seen her OBGYN about and had negative vaginal swabs. VSS. On exam patient appeared mildly anxious. Mild tenderness noted to lower abdomen, no peritoneal signs. Remaining exam unremarkable. No neuro deficits. Throughout exam patient reports multiple times that her numbness was moving from her right arm to her left arm, and then to her face and then back to her left arm. EKG showed sinus rhythm with sinus arrhythmia, no significant changes from prior. Troponin negative. Labs unremarkable. Chest x-ray negative.   On reevaluation patient reports she is continued to feel short of breath and appears mildly anxious. Patient given by mouth Ativan. She notes her symptoms have resolved s/p ativan. I have a low suspicion for ACS, PE, dissection, or other acute cardiac event at this time.  I suspect pt's presentation is likely due to anxiety. Plan to d/c pt home and advised patient to follow up with her PCP.  Evaluation does not show pathology requring ongoing emergent intervention or admission. Pt is hemodynamically stable and mentating appropriately. Discussed findings/results and plan with patient/guardian, who agrees with plan. All questions answered. Return precautions discussed and outpatient follow up given.        Satira Sark [place], [place] 06/18/15 1640  Marily Memos, MD 06/19/15 [phone]

## 2015-06-18 NOTE — Discharge Instructions (Signed)
Continue taking her home medications as prescribed. Take your prescription of Ativan as prescribed as needed.  Follow-up with your primary care provider in the next 3-4 days. Please return to the Emergency Department if symptoms worsen or new onset of fever, headache, visual changes, lightheadedness, dizziness, shortness of breath, chest pain, abdominal pain, vomiting, diarrhea, numbness, tingling, weakness, syncope, seizure.   Emergency Department Resource Guide 1) Find a Doctor and Pay Out of Pocket Although you won't have to find out who is covered by your insurance plan, it is a good idea to ask around and get recommendations. You will then need to call the office and see if the doctor you have chosen will accept you as a new patient and what types of options they offer for patients who are self-pay. Some doctors offer discounts or will set up payment plans for their patients who do not have insurance, but you will need to ask so you aren't surprised when you get to your appointment.  2) Contact Your Local Health Department Not all health departments have doctors that can see patients for sick visits, but many do, so it is worth a call to see if yours does. If you don't know where your local health department is, you can check in your phone book. The CDC also has a tool to help you locate your state's health department, and many state websites also have listings of all of their local health departments.  3) Find a Walk-in Clinic If your illness is not likely to be very severe or complicated, you may want to try a walk in clinic. These are popping up all over the country in pharmacies, drugstores, and shopping centers. They're usually staffed by nurse practitioners or physician assistants that have been trained to treat common illnesses and complaints. They're usually fairly quick and inexpensive. However, if you have serious medical issues or chronic medical problems, these are probably not your  best option.  No Primary Care Doctor: - Call Health Connect at  [phone] - they can help you locate a primary care doctor that  accepts your insurance, provides certain services, etc. - Physician Referral Service- [phone]  Chronic Pain Problems: Organization         Address  Phone   Notes  [facility]  [phone] Patients need to be referred by their primary care doctor.   Medication Assistance: Organization         Address  Phone   Notes  [facility] [address] [place] [place], [place] [address] [phone] --Must be a resident of [facility] -- Must have NO insurance coverage whatsoever (no Medicaid/ Medicare, etc.) -- The pt. MUST have a primary care doctor that directs their care regularly and follows them in the community   [facility]  [phone]   [employer]  [phone]    Agencies that provide inexpensive medical care: Organization         Address  Phone   Notes  [facility]  [phone]   [facility]    [phone]   [facility] [address] [place], [place] [address] [phone]   [facility] [address] [place] [place] [phone]   [facility]anned Parenthood    [phone]   [facility]    [phone]   [facility]  [address] [place], [place] Phone:  [phone], Fax:  [phone] Hours of Operation:  9 am - 6 pm, M-F.  Also accepts Medicaid/Medicare and self-pay.  [facility]  [address], [place] Phone: [phone], Fax: [phone]. Hours of Operation:  8:30 am - 5:30 pm, M-F.  Also accepts Medicaid and self-pay.  [facility] [place] [address] [place] Phone: [phone]   [facility] [address] Natasha Bence [place], [place] [phone], Ext. [phone] Mondays & Thursdays: 7-9 AM.  First 15  patients are seen on a first come, first serve basis.    Medicaid-accepting [facility] Providers:  Organization         Address  Phone   Notes  [facility] [address], [place] [phone] Also accepts self-pay patients.  [facility] [address] Laurell Josephs [place], [place]  [phone]   [facility] [address], [place] [phone]   [facility] [address] [place] [phone]   Renaye Rakers [address], [place]   [phone] Only accepts [place] Access [place] patients after they have their name applied to their card.   Self-Pay (no insurance) in [facility]:  Organization         Address  Phone   Notes  Sickle Cell Patients, [facility] [address] [place], [place] [phone]   [facility] [address] [place], [place] [phone]   [facility] [place]  [address], [place] [phone]   [facility]/Dr. Osei-Bonsu  [address] [place] or [address], [place] [phone] Phone number for both [place] and [place] locations is the same.  [facility] [address] [place] [phone]   [facility] [address] [place] or [address] [phone]   [facility] [address] [place] [phone], phone; [phone], fax Sees patients 1st and 3rd Saturday of every month.  Must not qualify for public or private insurance (i.e. Medicaid, Medicare, [place] Health Choice, Veterans' Benefits)  Household income should be no more than 200% of the poverty level The clinic cannot treat you if you are pregnant or think you are pregnant  Sexually transmitted diseases are not treated at the clinic.    Dental  Care: Organization         Address  Phone  Notes  [facility] [address] [place], [place] [phone] Accepts children up to age 19 who are enrolled in [place] or [place] Health Choice; pregnant women with a Medicaid card; and children who have applied for Medicaid or [place] Health Choice, but were declined, whose parents can pay a reduced fee at time of service.  [facility] [place] [facility]  [address], [place] [phone] Accepts children up to age 70 who are enrolled in [place] or [place] Health Choice; pregnant women with a Medicaid card; and children who have applied for Medicaid or [place] Health Choice, but were declined, whose parents can pay a reduced fee at time of service.  [facility]  [address] [place], [place] [phone] Patients are seen by appointment only. Walk-ins  are not accepted. [facility] will see patients 72 years of age and older. Monday - Tuesday (8am-5pm) Most Wednesdays (8:30-5pm) $30 per visit, cash only  [facility] Adult Dental Access PROGRAM  [address], [facility] [phone] Patients are seen by appointment only. Walk-ins are not accepted. [facility] will see patients 2 years of age and older. One Wednesday Evening (Monthly: Volunteer Based).  $30 per visit, cash only  [employer]  [phone] for adults; Children under age 16, call Graduate Pediatric Dentistry at [phone]. Children aged 41-14, please call [phone] to request a pediatric application.  Dental services are provided in all areas of dental care including fillings, crowns and bridges, complete and partial dentures, implants, gum treatment, root canals, and extractions. Preventive care is also provided. Treatment is provided to both adults and children. Patients are selected via a lottery and there is often a waiting list.   [facility] [address], [place]  [phone] [email or web address]   [facility] [address] [place], [place] [phone], Ext. [phone] Second and Fourth Thursday of each month, opens at 6:30 AM; Clinic ends at 9 AM.  Patients are seen on a first-come first-served basis, and a limited number are seen during each clinic.   [facility]  [address] Ether [place], [place] [phone]   Eligibility Requirements You must have lived in [place], [place], or [place] counties for at least the last three months.   You cannot be eligible for state or federal sponsored [place], including [employer], [place], or [employer].   You generally cannot be eligible for healthcare insurance through your employer.    How to apply: Eligibility screenings are held every Tuesday and Wednesday afternoon from 1:00 pm until 4:00 pm. You do not need an appointment for the interview!  [facility] [address] [place], [place] [phone]   [facility] Health Department  [phone]   [facility] Health Department  [phone]   [facility] Health Department  [phone]    Behavioral Health Resources in the Community: Intensive Outpatient Programs Organization         Address  Phone  Notes  [facility] [address] [place], [place] [phone]   [facility] Outpatient [address], [place], [place] [phone]   [facility] [address] [place], [place]  [phone]   [facility] Mental Health [address]  [place], [place] [phone] or [phone]   Substance Abuse Resources Organization         Address  Phone  Notes  [facility] Drug Services  [phone]   [facility]  [phone]   [facility] [place]  [phone]   Floydene Flock  [phone]   Residential & Outpatient Substance Abuse Program  [phone]    Psychological Services Organization         Address  Phone  Notes  [facility] Behavioral Health  [phone]   [facility]  [phone]   [facility] Mental Health [address], [place] [phone] or [phone]    Mobile Crisis Teams Organization         Address  Phone  Notes  [facility], Mobile Crisis Care Unit  [phone]   [facility]  [address] [place], [place] [phone]   [facility] [address] [place] [address] [place] [phone]  Self-Help/Support Groups Organization         Address  Phone             Notes  [facility] - variety of support groups  336- I7437963 Call for more information  Narcotics Anonymous (NA), [facility] [address], [employer] [place]  2 meetings at this location   Statistician         Address  Phone  Notes  [facility] [address],    [place]  [phone]   [facility]  [address] [place] [address], [place], [place] [phone]   [facility] Facility [address] [place], [place] [phone] Admissions: 8am-3pm M-F  [facility] [address]    [place], [place] [phone]   [facility] [address] [place], [place], [place] [phone]   [facility] [address]  [place], [place] [phone]   [facility] - Intensive Outpatient [address]ure[address] Joseph[address], [place], [place] [phone]   [facility] [place] ([facility] [address] [place]  [place], [place] [phone] or [phone]   [facility] ([facility]) [address], [place], [place] [phone] Accepts Medicaid  [facility] [place] [address]  [place] [phone] Substance Abuse/Addiction Treatment   [facility] Organization         Address  Phone  Notes  [facility]  [phone]   Angie Fava, PhD [address] Ervin Knack [place], [place]   [phone] or [phone]   [facility]   [address] [place], [place] [phone]   [facility] [address] [place], [place] [phone] Insurance/Medicaid/sponsorship through [facility] [address]                                    [place], [place] [phone] Therapy/tele-psych/case  [facility] [address], [place] [phone]    Dr. Lolly Mustache  [phone]   [facility]  [employer] [place] [facility]. 1) [address], [place] 2) [address] [place] 3)  [address], [place] [phone]  [phone]   [facility] Child Abuse Hotline 351-210-2175 or [phone] (After Hours)

## 2015-06-18 NOTE — ED Notes (Signed)
Reports chest pain that started earlier this morning and numbness down her left arm. States she recently started taking Zoloft 3 days ago and thinks that she may be having a reaction to that medication.

## 2015-08-12 IMAGING — US US OB COMP LESS 14 WK
1 series · 13 of 28 positions shown · non-contrast
Comparison: No priors.

CLINICAL DATA: Pelvic cramping.

EXAM:
OBSTETRIC <14 WK US AND TRANSVAGINAL OB US
TECHNIQUE: Both transabdominal and transvaginal ultrasound examinations were
performed for complete evaluation of the gestation as well as the
maternal uterus, adnexal regions, and pelvic cul-de-sac.
Transvaginal technique was performed to assess early pregnancy.

[Series 1: us ob comp less 14 wks · 13 of 38 slices shown]
[im 2/38]
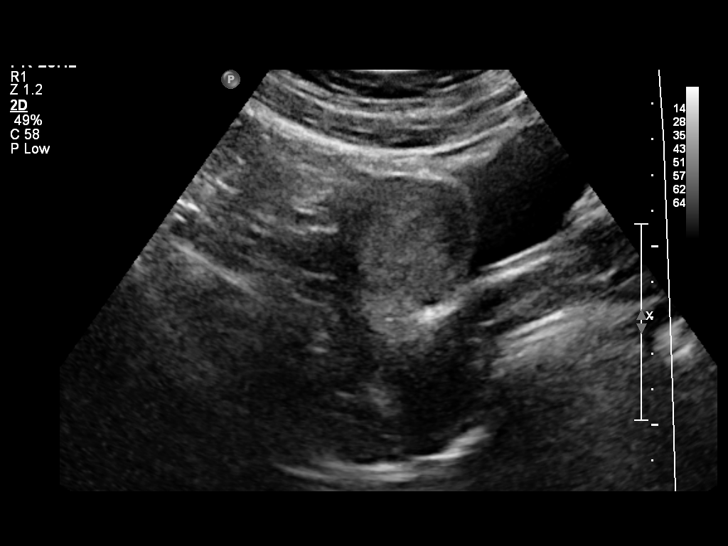
[im 5/38]
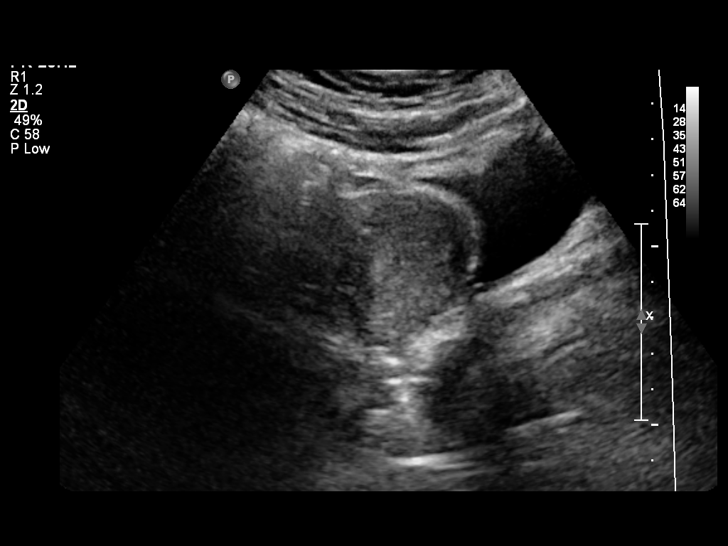
[im 7/38]
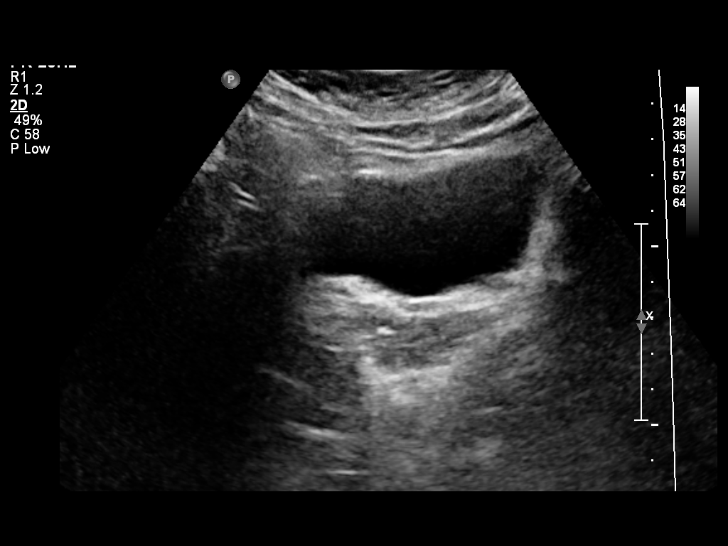
[im 10/38]
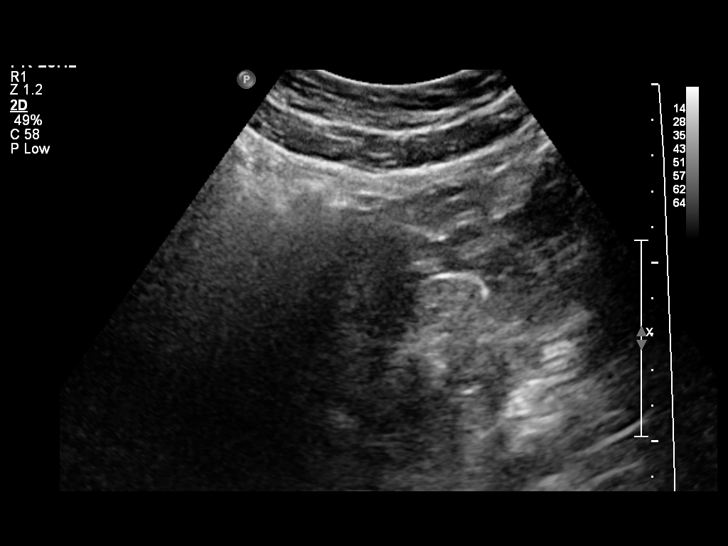
[im 13/38]
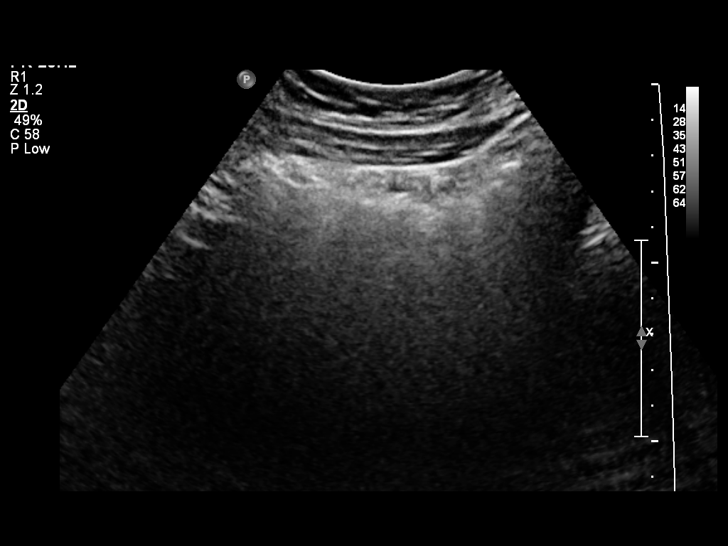
[im 16/38]
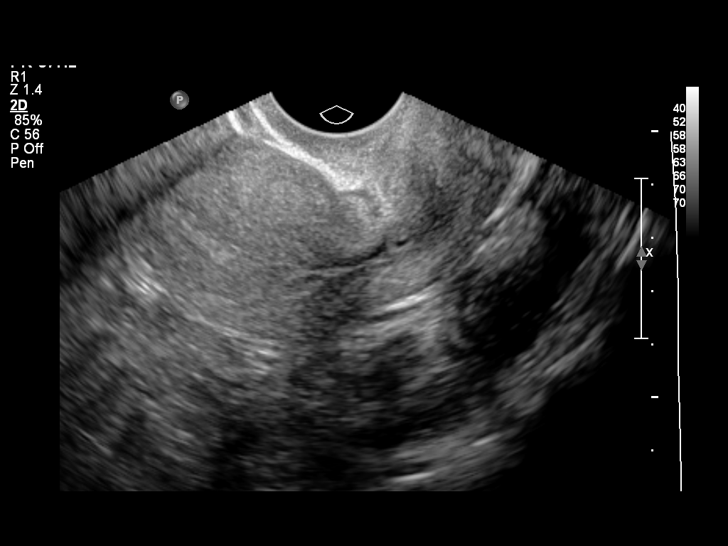
[im 20/38]
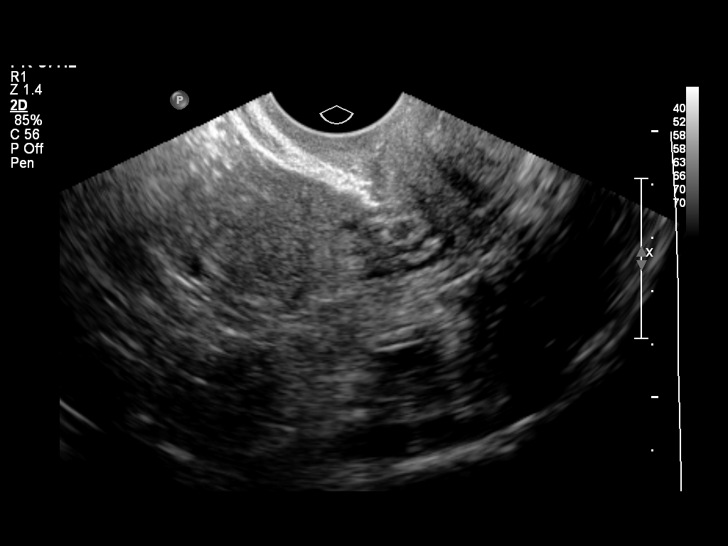
[im 22/38]
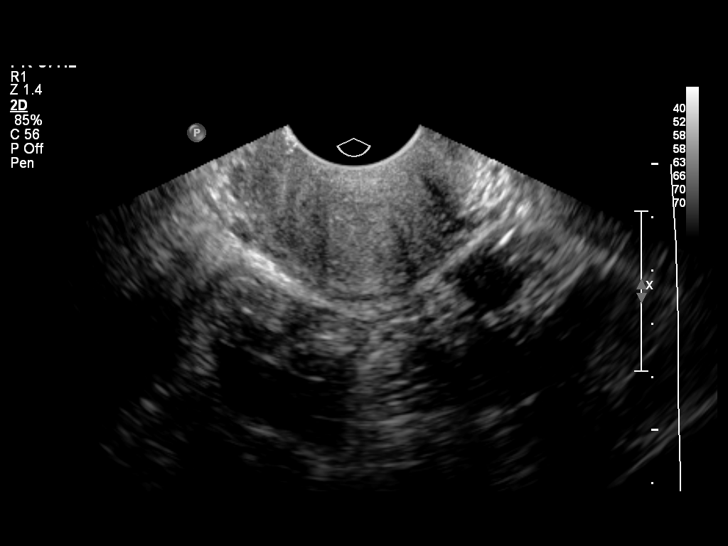
[im 25/38]
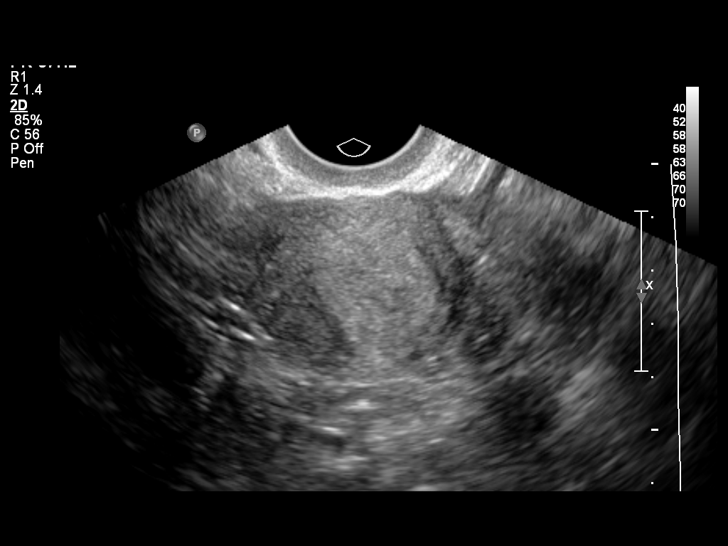
[im 28/38]
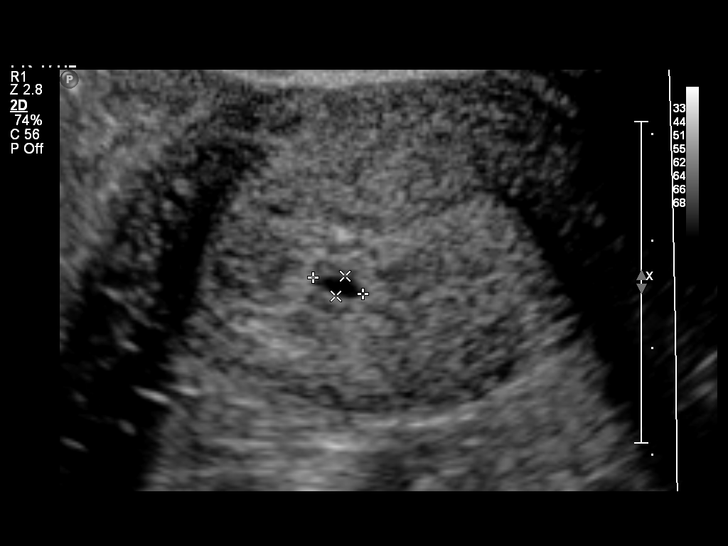
[im 31/38]
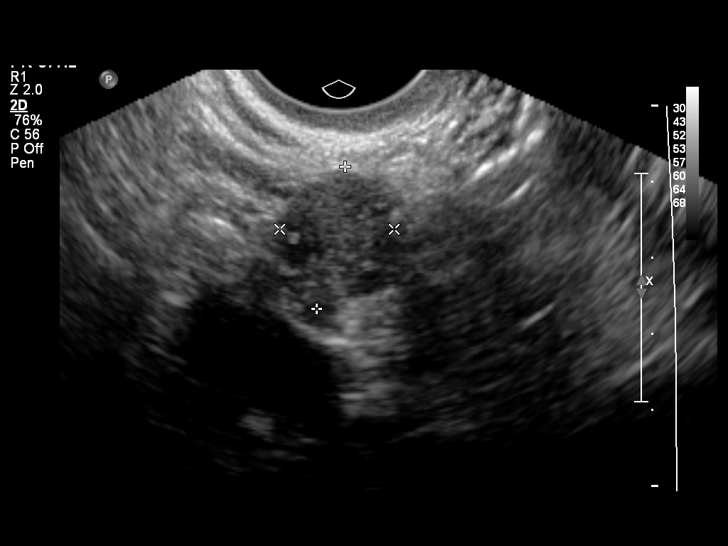
[im 33/38]
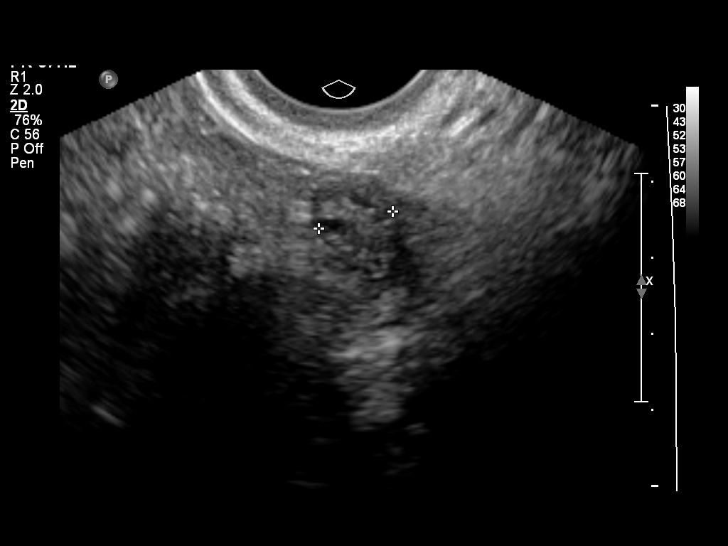
[im 36/38]
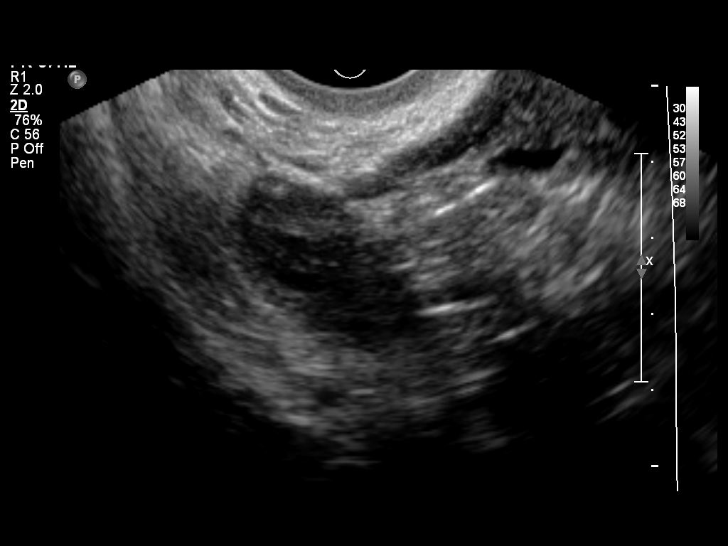

[13 of 28 positions shown; findings below may reference images not displayed]

FINDINGS: Intrauterine gestational sac: Small anechoic structure with
increased through transmission in the fundal portion of the
endometrial canal, potentially a small gestational sac.

Yolk sac:  None.

Embryo:  None.

Cardiac Activity: None.

Heart Rate:  N/A

MSD:  3.2  mm   4 w   6  d     US EDC: 09/02/2014

Maternal uterus/adnexae: If the structure discussed above is a
gestational sac there is a tiny hypoechoic crescent adjacent to it,
which could represent a trace amount of subchorionic hemorrhage. No
significant free fluid in the cul-de-sac. The ovaries are normal in
echotexture in appearance bilaterally.
IMPRESSION: 1. Possible early IUP, as above, with estimated gestational age of 4
weeks and 6 days.
2. If this is in fact an early IUP, there appears to be a tiny
subchorionic hemorrhage around the gestational sac.

## 2015-09-30 ENCOUNTER — Emergency Department
Admission: EM | Admit: 2015-09-30 | Discharge: 2015-09-30 | Disposition: A | Discharge status: Home / Self Care | Payer: Self-pay | Attending: Emergency Medicine | Admitting: Emergency Medicine

## 2015-09-30 ENCOUNTER — Emergency Department: Payer: Self-pay

## 2015-09-30 ENCOUNTER — Encounter: Payer: Self-pay | Admitting: *Deleted

## 2015-09-30 DIAGNOSIS — J069 Acute upper respiratory infection, unspecified: Secondary | ICD-10-CM | POA: Insufficient documentation

## 2015-09-30 DIAGNOSIS — F418 Other specified anxiety disorders: Secondary | ICD-10-CM | POA: Insufficient documentation

## 2015-09-30 MED ORDER — PREDNISONE 10 MG PO TABS: ORAL_TABLET | ORAL | Status: DC

## 2015-09-30 NOTE — Discharge Instructions (Signed)
Upper Respiratory Infection, Adult Most upper respiratory infections (URIs) are caused by a virus. A URI affects the nose, throat, and upper air passages. The most common type of URI is often called "the common cold." HOME CARE   Take medicines only as told by your doctor.  Gargle warm saltwater or take cough drops to comfort your throat as told by your doctor.  Use a warm mist humidifier or inhale steam from a shower to increase air moisture. This may make it easier to breathe.  Drink enough fluid to keep your pee (urine) clear or pale yellow.  Eat soups and other clear broths.  Have a healthy diet.  Rest as needed.  Go back to work when your fever is gone or your doctor says it is okay.  You may need to stay home longer to avoid giving your URI to others.  You can also wear a face mask and wash your hands often to prevent spread of the virus.  Use your inhaler more if you have asthma.  Do not use any tobacco products, including cigarettes, chewing tobacco, or electronic cigarettes. If you need help quitting, ask your doctor. GET HELP IF:  You are getting worse, not better.  Your symptoms are not helped by medicine.  You have chills.  You are getting more short of breath.  You have brown or red mucus.  You have yellow or brown discharge from your nose.  You have pain in your face, especially when you bend forward.  You have a fever.  You have puffy (swollen) neck glands.  You have pain while swallowing.  You have white areas in the back of your throat. GET HELP RIGHT AWAY IF:   You have very bad or constant:  Headache.  Ear pain.  Pain in your forehead, behind your eyes, and over your cheekbones (sinus pain).  Chest pain.  You have long-lasting (chronic) lung disease and any of the following:  Wheezing.  Long-lasting cough.  Coughing up blood.  A change in your usual mucus.  You have a stiff neck.  You have changes in  your:  Vision.  Hearing.  Thinking.  Mood. MAKE SURE YOU:   Understand these instructions.  Will watch your condition.  Will get help right away if you are not doing well or get worse.   This information is not intended to replace advice given to you by your health care provider. Make sure you discuss any questions you have with your health care provider.   Document Released: 10/25/2007 Document Revised: 09/22/2014 Document Reviewed: 08/13/2013 Elsevier Interactive Patient Education [employer]2016 [employer]vier Inc.    Follow-up with your primary care doctor if any continued problems. Continue Mucinex and Robitussin as needed. Begin prednisone 30 mg once a day for 5 days.

## 2015-09-30 NOTE — ED Notes (Signed)
Pt complains of cough and congestion for 1 week, pt reports fever for the first 2 days

## 2015-09-30 NOTE — ED Provider Notes (Signed)
[facility] Emergency Department Provider Note  ____________________________________________  Time seen: Approximately 10:05 AM  I have reviewed the triage vital signs and the nursing notes.   HISTORY  Chief Complaint Cough    HPI [name] is a 25 y.o. female is here with complaint of cough and congestion for several days. Patient states that the cough is actually been going on for approximately one week but she reports fever for the last 2 days. Patient has been taking over-the-counter medication without any relief.Patient states that she has a history of asthma and currently does not have an inhaler at home.   Past Medical History  Diagnosis Date  . Chest pain, unspecified   . Dyspnea   . Hyperventilation   . Panic attack   . Anxiety and depression   . Malaise and fatigue   . Allergy   . Panic attack   . Anxiety   . Gluten intolerance   . IBS (irritable bowel syndrome)   . Migraine   . Pregnancy induced hypertension   . SVD (spontaneous vaginal delivery) 08/31/2014    Patient Active Problem List   Diagnosis Date Noted  . SVD (spontaneous vaginal delivery) 08/31/2014  . Labor and delivery indication for care or intervention 08/30/2014  . FHR (fetal heart rate) nonreactive   . [redacted] weeks gestation of pregnancy   . Encounter for fetal anatomic survey   . [redacted] weeks gestation of pregnancy   . PANIC ATTACK 10/27/2008  . ANXIETY DEPRESSION 10/27/2008    Past Surgical History  Procedure Laterality Date  . Tonsillectomy    . Adenoidectomy    . Wisdom tooth extraction      Current Outpatient Rx  Name  Route  Sig  Dispense  Refill  . acetaminophen (TYLENOL) 500 MG tablet   Oral   Take 1,000 mg by mouth every 6 (six) hours as needed for moderate pain.         . calcium carbonate (TUMS - DOSED IN MG ELEMENTAL CALCIUM) 500 MG chewable tablet   Oral   Chew 2 tablets by mouth 3 (three) times daily as needed for indigestion or  heartburn.         . diphenhydrAMINE (BENADRYL) 25 MG tablet   Oral   Take 25 mg by mouth at bedtime as needed for sleep.         Marland Kitchen. etonogestrel-ethinyl estradiol (NUVARING) 0.12-0.015 MG/24HR vaginal ring   Vaginal   Place 1 each vaginally every 28 (twenty-eight) days. Insert vaginally and leave in place for 3 consecutive weeks, then remove for 1 week.         . norethindrone (NORA-BE) 0.35 MG tablet   Oral   Take 1 tablet by mouth daily.         . predniSONE (DELTASONE) 10 MG tablet      Take 3 tablets once a day for 5 days   15 tablet   0   . sertraline (ZOLOFT) 100 MG tablet   Oral   Take 100 mg by mouth daily.           Allergies Hydrocodone and Sulfa antibiotics  Family History  Problem Relation Age of Onset  . Hypertension Mother   . Cancer Mother   . Heart disease Maternal Grandfather     Social History Social History  Substance Use Topics  . Smoking status: Never Smoker   . Smokeless tobacco: Never Used  . Alcohol Use: No    Review  of Systems Constitutional: No fever/chills ENT: No sore throat.Positive nasal congestion. Cardiovascular: Denies chest pain.  Respiratory: Denies shortness of breath. Positive cough Gastrointestinal: No abdominal pain.  No nausea, no vomiting.   Musculoskeletal: Negative for back pain. Skin: Negative for rash. Neurological: Negative for headaches, focal weakness or numbness. 10-point ROS otherwise negative.  ____________________________________________   PHYSICAL EXAM:  VITAL SIGNS: ED Triage Vitals  Enc Vitals Group     BP 09/30/15 0940 114/73 mmHg     Pulse Rate 09/30/15 0940 108     Resp 09/30/15 0940 17     Temp 09/30/15 0940 98.2 F (36.8 C)     Temp Source 09/30/15 0940 Oral     SpO2 09/30/15 0940 100 %     Weight 09/30/15 0940 132 lb (59.875 kg)     Height 09/30/15 0940  (1.626 m)     Head Cir --      Peak Flow --      Pain Score --      Pain Loc --      Pain Edu? --      Excl. in  GC? --     Constitutional: Alert and oriented. Well appearing and in no acute distress. Eyes: Conjunctivae are normal. PERRL. EOMI. Head: Atraumatic. Nose: Mild congestion/no rhinnorhea.    EACs and TMs are clear. Mouth/Throat: Mucous membranes are moist.  Oropharynx non-erythematous. Posterior pharynx positive for drainage. Neck: No stridor.   Hematological/Lymphatic/Immunilogical: No cervical lymphadenopathy. Cardiovascular: Normal rate, regular rhythm. Grossly normal heart sounds.  Good peripheral circulation. Respiratory: Normal respiratory effort.  No retractions. Lungs CTAB. Coarse cough is present. No wheezes were heard. Gastrointestinal: Soft and nontender. No distention.  Musculoskeletal: His upper extremities without any difficulty. Normal gait was noted. Neurologic:  Normal speech and language. No gross focal neurologic deficits are appreciated. No gait instability. Skin:  Skin is warm, dry and intact. No rash noted. Psychiatric: Mood and affect are normal. Speech and behavior are normal.  ____________________________________________   LABS (all labs ordered are listed, but only abnormal results are displayed)  Labs Reviewed - No data to display ____________________________________________  EKG  Deferred ____________________________________________  RADIOLOGY  Chest x-ray no active cardiopulmonary process per radiologist ____________________________________________   PROCEDURES  Procedure(s) performed: None  Critical Care performed: No  ____________________________________________   INITIAL IMPRESSION / ASSESSMENT AND PLAN / ED COURSE  Pertinent labs & imaging results that were available during my care of the patient were reviewed by me and considered in my medical decision making (see chart for details).  Patient given prescription for prednisone. She is to continue taking over-the-counter medication for cough and congestion. She'll follow-up with her  primary care doctor if any continued problems or urgent concerns. ____________________________________________   FINAL CLINICAL IMPRESSION(S) / ED DIAGNOSES  Final diagnoses:  Acute upper respiratory infection      NEW MEDICATIONS STARTED DURING THIS VISIT:  Discharge Medication List as of 09/30/2015 11:13 AM       Note:  This document was prepared using Dragon voice recognition software and may include unintentional dictation errors.    Tommi Rumps, PA-C 09/30/15 1626  Jennye Moccasin, MD 10/01/15 [phone]

## 2015-09-30 NOTE — ED Notes (Signed)
States she developed cough and fever for couple of days

## 2016-01-01 ENCOUNTER — Encounter (HOSPITAL_COMMUNITY): Payer: Self-pay

## 2016-01-01 ENCOUNTER — Emergency Department (HOSPITAL_COMMUNITY)
Admission: EM | Admit: 2016-01-01 | Discharge: 2016-01-01 | Disposition: A | Discharge status: Home / Self Care | Payer: Medicaid Other | Attending: Emergency Medicine | Admitting: Emergency Medicine

## 2016-01-01 ENCOUNTER — Emergency Department (HOSPITAL_COMMUNITY): Payer: Medicaid Other

## 2016-01-01 DIAGNOSIS — N72 Inflammatory disease of cervix uteri: Secondary | ICD-10-CM | POA: Insufficient documentation

## 2016-01-01 DIAGNOSIS — R109 Unspecified abdominal pain: Secondary | ICD-10-CM

## 2016-01-01 LAB — URINALYSIS, ROUTINE W REFLEX MICROSCOPIC
Bilirubin Urine: NEGATIVE
Glucose, UA: NEGATIVE mg/dL
Hgb urine dipstick: NEGATIVE
KETONES UR: NEGATIVE mg/dL
LEUKOCYTES UA: NEGATIVE
NITRITE: NEGATIVE
PROTEIN: NEGATIVE mg/dL
Specific Gravity, Urine: 1.02 (ref 1.005–1.030)
pH: 6.5 (ref 5.0–8.0)

## 2016-01-01 LAB — COMPREHENSIVE METABOLIC PANEL
ALT: 12 U/L — ABNORMAL LOW (ref 14–54)
AST: 17 U/L (ref 15–41)
Albumin: 3.9 g/dL (ref 3.5–5.0)
Alkaline Phosphatase: 53 U/L (ref 38–126)
Anion gap: 6 (ref 5–15)
BILIRUBIN TOTAL: 0.5 mg/dL (ref 0.3–1.2)
BUN: 10 mg/dL (ref 6–20)
CO2: 25 mmol/L (ref 22–32)
CREATININE: 0.74 mg/dL (ref 0.44–1.00)
Calcium: 9.3 mg/dL (ref 8.9–10.3)
Chloride: 105 mmol/L (ref 101–111)
GFR calc Af Amer: >60 mL/min (ref >60)
Glucose, Bld: 97 mg/dL (ref 65–99)
POTASSIUM: 3.8 mmol/L (ref 3.5–5.1)
Sodium: 136 mmol/L (ref 135–145)
TOTAL PROTEIN: 6.8 g/dL (ref 6.5–8.1)

## 2016-01-01 LAB — CBC WITH DIFFERENTIAL/PLATELET
Basophils Absolute: 0 10*3/uL (ref 0.0–0.1)
Basophils Relative: 0 %
EOS PCT: 1 %
Eosinophils Absolute: 0.1 10*3/uL (ref 0.0–0.7)
HCT: 38.5 % (ref 36.0–46.0)
Hemoglobin: 13 g/dL (ref 12.0–15.0)
LYMPHS ABS: 2.6 10*3/uL (ref 0.7–4.0)
LYMPHS PCT: 45 %
MCH: 30.7 pg (ref 26.0–34.0)
MCHC: 33.8 g/dL (ref 30.0–36.0)
MCV: 91 fL (ref 78.0–100.0)
MONO ABS: 0.3 10*3/uL (ref 0.1–1.0)
MONOS PCT: 6 %
Neutro Abs: 2.7 10*3/uL (ref 1.7–7.7)
Neutrophils Relative %: 48 %
PLATELETS: 184 10*3/uL (ref 150–400)
RBC: 4.23 MIL/uL (ref 3.87–5.11)
RDW: 12.3 % (ref 11.5–15.5)
WBC: 5.8 10*3/uL (ref 4.0–10.5)

## 2016-01-01 LAB — LIPASE, BLOOD: LIPASE: 25 U/L (ref 11–51)

## 2016-01-01 LAB — WET PREP, GENITAL
Clue Cells Wet Prep HPF POC: NONE SEEN
Sperm: NONE SEEN
Trich, Wet Prep: NONE SEEN
Yeast Wet Prep HPF POC: NONE SEEN

## 2016-01-01 LAB — POC URINE PREG, ED: PREG TEST UR: NEGATIVE

## 2016-01-01 LAB — I-STAT CG4 LACTIC ACID, ED: Lactic Acid, Venous: 0.7 mmol/L (ref 0.5–1.9)

## 2016-01-01 MED ORDER — AZITHROMYCIN 250 MG PO TABS
1000.0000 mg | ORAL_TABLET | Freq: Once | ORAL | Status: AC
  Administered 2016-01-01: 1000 mg via ORAL
  Filled 2016-01-01: qty 4

## 2016-01-01 MED ORDER — MORPHINE SULFATE (PF) 4 MG/ML IV SOLN
4.0000 mg | Freq: Once | INTRAVENOUS | Status: AC
  Administered 2016-01-01: 4 mg via INTRAVENOUS
  Filled 2016-01-01: qty 1

## 2016-01-01 MED ORDER — IOPAMIDOL (ISOVUE-300) INJECTION 61%
INTRAVENOUS | Status: AC
  Administered 2016-01-01: 100 mL
  Filled 2016-01-01: qty 100

## 2016-01-01 MED ORDER — ONDANSETRON 4 MG PO TBDP: 4.0000 mg | ORAL_TABLET | Freq: Three times a day (TID) | ORAL | 0 refills | Status: DC | PRN

## 2016-01-01 MED ORDER — CEFTRIAXONE SODIUM 250 MG IJ SOLR
250.0000 mg | Freq: Once | INTRAMUSCULAR | Status: AC
  Administered 2016-01-01: 250 mg via INTRAMUSCULAR
  Filled 2016-01-01: qty 250

## 2016-01-01 MED ORDER — KETOROLAC TROMETHAMINE 15 MG/ML IJ SOLN
15.0000 mg | Freq: Once | INTRAMUSCULAR | Status: AC
  Administered 2016-01-01: 15 mg via INTRAVENOUS
  Filled 2016-01-01: qty 1

## 2016-01-01 MED ORDER — POTASSIUM CHLORIDE 10 MEQ/100ML IV SOLN
10.0000 meq | Freq: Once | INTRAVENOUS | Status: DC
  Filled 2016-01-01: qty 100

## 2016-01-01 MED ORDER — ONDANSETRON HCL 4 MG/2ML IJ SOLN
4.0000 mg | Freq: Once | INTRAMUSCULAR | Status: AC
  Administered 2016-01-01: 4 mg via INTRAVENOUS
  Filled 2016-01-01: qty 2

## 2016-01-01 NOTE — ED Triage Notes (Signed)
Patient states that she hasn't been feeling well the past week and today developed increased right side and flank pain radiating to umbilicus. Complains of dysuria and vaginal discharge

## 2016-01-01 NOTE — ED Notes (Signed)
Pt returned from CT °

## 2016-01-01 NOTE — ED Provider Notes (Signed)
[facility]-EMERGENCY DEPT Provider Note   CSN: [id number] Arrival date & time: 01/01/16  1550  First Provider Contact:  None       History   Chief Complaint Chief Complaint  Patient presents with  . Abdominal Pain  . Flank Pain    HPI [name] is a 25 y.o. female.   Abdominal Pain   This is a new problem. The current episode started 6 to 12 hours ago. The problem occurs constantly. The problem has not changed since onset.Associated with: nothing. The pain is located in the RLQ and suprapubic region. The quality of the pain is sharp. The pain is moderate. Associated symptoms include fever (up to ~102 on Tuesday and again febrile on thursday), diarrhea (looser stools than usual), nausea, dysuria, frequency and myalgias. Pertinent negatives include anorexia, melena, vomiting and hematuria. Constipation: chronic due to IBS. Exacerbated by: palpation. Nothing relieves the symptoms. Past workup does not include GI consult, CT scan, ultrasound or surgery. Her past medical history is significant for irritable bowel syndrome. Her past medical history does not include gallstones, GERD, ulcerative colitis or Crohn's disease.    Past Medical History:  Diagnosis Date  . Allergy   . Anxiety   . Anxiety and depression   . Chest pain, unspecified   . Dyspnea   . Gluten intolerance   . Hyperventilation   . IBS (irritable bowel syndrome)   . Malaise and fatigue   . Migraine   . Panic attack   . Panic attack   . Pregnancy induced hypertension   . SVD (spontaneous vaginal delivery) 08/31/2014    Patient Active Problem List   Diagnosis Date Noted  . SVD (spontaneous vaginal delivery) 08/31/2014  . Labor and delivery indication for care or intervention 08/30/2014  . FHR (fetal heart rate) nonreactive   . [redacted] weeks gestation of pregnancy   . Encounter for fetal anatomic survey   . [redacted] weeks gestation of pregnancy   . PANIC ATTACK 10/27/2008  . ANXIETY DEPRESSION 10/27/2008    Past  Surgical History:  Procedure Laterality Date  . ADENOIDECTOMY    . TONSILLECTOMY    . WISDOM TOOTH EXTRACTION      OB History    Gravida Para Term Preterm AB Living   SAB TAB Ectopic Multiple Live Births         0 1       Home Medications    Prior to Admission medications   Medication Sig Start Date End Date Taking? Authorizing Provider  acetaminophen (TYLENOL) 500 MG tablet Take 1,000 mg by mouth every 6 (six) hours as needed for moderate pain.    Historical Provider, MD  calcium carbonate (TUMS - DOSED IN MG ELEMENTAL CALCIUM) 500 MG chewable tablet Chew 2 tablets by mouth 3 (three) times daily as needed for indigestion or heartburn.    Historical Provider, MD  diphenhydrAMINE (BENADRYL) 25 MG tablet Take 25 mg by mouth at bedtime as needed for sleep.    Historical Provider, MD  etonogestrel-ethinyl estradiol (NUVARING) 0.12-0.015 MG/24HR vaginal ring Place 1 each vaginally every 28 (twenty-eight) days. Insert vaginally and leave in place for 3 consecutive weeks, then remove for 1 week.    Historical Provider, MD  norethindrone (NORA-BE) 0.35 MG tablet Take 1 tablet by mouth daily.    Historical Provider, MD  predniSONE (DELTASONE) 10 MG tablet Take 3 tablets once a day for 5 days 09/30/15   Bjorn Loser  L Summers, PA-C  sertraline (ZOLOFT) 100 MG tablet Take 100 mg by mouth daily.    Historical Provider, MD    Family History Family History  Problem Relation Age of Onset  . Hypertension Mother   . Cancer Mother   . Heart disease Maternal Grandfather     Social History Social History  Substance Use Topics  . Smoking status: Never Smoker  . Smokeless tobacco: Never Used  . Alcohol use No     Allergies   Hydrocodone and Sulfa antibiotics   Review of Systems Review of Systems  Constitutional: Positive for chills, diaphoresis and fever (up to ~102 on Tuesday and again febrile on thursday).  HENT: Positive for congestion and rhinorrhea.   Eyes: Negative for  discharge.  Respiratory: Positive for cough (occasional during the week). Negative for shortness of breath.   Cardiovascular: Negative for chest pain.  Gastrointestinal: Positive for abdominal pain, diarrhea (looser stools than usual) and nausea. Negative for anorexia, blood in stool, melena and vomiting. Constipation: chronic due to IBS.  Genitourinary: Positive for dysuria, frequency, vaginal discharge and vaginal pain. Negative for hematuria.  Musculoskeletal: Positive for myalgias.       Flank pain bilat  Skin: Negative for pallor and rash.  Neurological: Negative.   Psychiatric/Behavioral: Negative for agitation and confusion. The patient is nervous/anxious (mild).      Physical Exam Updated Vital Signs BP 125/87 (BP Location: Right Arm)   Pulse 72   Temp 97.7 F (36.5 C) (Oral)   Resp 16   Ht 5\' 4"  (1.626 m)   Wt 65.4 kg   LMP 12/21/2015   SpO2 100%   BMI 24.76 kg/m   Physical Exam  Constitutional: She is oriented to person, place, and time. She appears well-developed and well-nourished. No distress.  HENT:  Head: Normocephalic and atraumatic.  Eyes: Conjunctivae are normal.  Neck: Normal range of motion. Neck supple. No tracheal deviation present.  Cardiovascular: Normal rate, regular rhythm, normal heart sounds and intact distal pulses.   No murmur heard. Pulmonary/Chest: Effort normal and breath sounds normal. No stridor. No respiratory distress. She has no wheezes. She has no rales.  Abdominal: Soft. She exhibits no distension. There is tenderness (RLQ and suprapubic). There is no rebound and no guarding.  Genitourinary: Uterus normal. There is no rash, tenderness, lesion or injury on the right labia. There is no rash, tenderness, lesion or injury on the left labia. Uterus is not enlarged. Cervix exhibits motion tenderness and friability. Cervix exhibits no discharge (no purulence noted). Right adnexum displays tenderness (RLQ tenderness). Right adnexum displays no mass  and no fullness. Left adnexum displays no mass, no tenderness and no fullness. No erythema, tenderness or bleeding in the vagina. No signs of injury around the vagina. Vaginal discharge (sporadic thick white discharge along vaginal walls) found.    Genitourinary Comments: nuvaring in place  Musculoskeletal: She exhibits no edema or deformity.  Neurological: She is alert and oriented to person, place, and time. GCS eye subscore is 4. GCS verbal subscore is 5. GCS motor subscore is 6.  Skin: Skin is warm and dry. Capillary refill takes less than 2 seconds. She is not diaphoretic.  Psychiatric:  Mildly anxious  Nursing note and vitals reviewed.    ED Treatments / Results  Labs (all labs ordered are listed, but only abnormal results are displayed) Labs Reviewed  WET PREP, GENITAL - Abnormal; Notable for the following:       Result Value   WBC, Wet  Prep HPF POC MANY (*)    All other components within normal limits  COMPREHENSIVE METABOLIC PANEL - Abnormal; Notable for the following:    ALT 12 (*)    All other components within normal limits  URINALYSIS, ROUTINE W REFLEX MICROSCOPIC (NOT AT [facility])  CBC WITH DIFFERENTIAL/PLATELET  LIPASE, BLOOD  POC URINE PREG, ED  I-STAT CG4 LACTIC ACID, ED  GC/CHLAMYDIA PROBE AMP ([place]) NOT AT [facility]    EKG  EKG Interpretation None       Radiology Ct Abdomen Pelvis W Contrast  Result Date: 01/01/2016 CLINICAL DATA:  25 year old female with right side pain radiating to the back. Nausea vomiting headache and fever. Initial encounter. EXAM: CT ABDOMEN AND PELVIS WITH CONTRAST TECHNIQUE: Multidetector CT imaging of the abdomen and pelvis was performed using the standard protocol following bolus administration of intravenous contrast. CONTRAST:  ISOVUE-300 IOPAMIDOL (ISOVUE-300) INJECTION 61% COMPARISON:  CT Abdomen and Pelvis 11/14/2013 and earlier. FINDINGS: Stable mild scarring or atelectasis in the costophrenic angles, more so the right.  Otherwise negative lung bases. No pericardial or pleural effusion. Stable visualized osseous structures. 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 Chronic sclerosis of the proximal right femoral shaft is chronic. 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 Trace free fluid in the right hemipelvis, decreased compared to the prior study. Negative uterus and adnexa. Pessary in place. Decompressed rectum. Unremarkable urinary bladder. Redundant but otherwise negative sigmoid colon. Negative left colon. Negative transverse colon aside from mild redundancy and retained stool. Negative right colon. The cecum is mostly located in the right hemipelvis. A normal appendix is suggested just posterior to the cecum on series 21, image 63. Negative terminal ileum. Decompressed small bowel loops in the pelvis. No dilated small bowel elsewhere. Negative stomach and duodenum. No abdominal free air or free fluid. Mild prominence of the central intrahepatic biliary tree is stable since 2015 (series 21, image 20). Liver enhancement otherwise is normal. The gallbladder appears normal. No CBD enlargement. Negative spleen, pancreas and adrenal glands. Portal venous system is patent. Major arterial structures are normal. No lymphadenopathy. Both kidneys appear stable and normal. IMPRESSION: Stable and negative CT appearance of the abdomen and pelvis. Electronically Signed   By: Odessa Fleming M.D.   On: 01/01/2016 18:57    Procedures Procedures (including critical care time)  Medications Ordered in ED Medications  ondansetron (ZOFRAN) injection 4 mg (4 mg Intravenous Given 01/01/16 1653)  morphine 4 MG/ML injection 4 mg (4 mg Intravenous Given 01/01/16 1653)  iopamidol (ISOVUE-300) 61 % injection (100 mLs  Contrast Given 01/01/16 1834)  ketorolac (TORADOL) 15 MG/ML injection 15 mg (15 mg  Intravenous Given 01/01/16 1949)  cefTRIAXone (ROCEPHIN) injection 250 mg (250 mg Intramuscular Given 01/01/16 2054)  azithromycin (ZITHROMAX) tablet 1,000 mg (1,000 mg Oral Given 01/01/16 2054)     Initial Impression / Assessment and Plan / ED Course  I have reviewed the triage vital signs and the nursing notes.  Pertinent labs & imaging results that were available during my care of the patient were reviewed by me and considered in my medical decision making (see chart for details).  Clinical Course   25 year old female presents with right lower quadrant and suprapubic abdominal pain that radiates to her right lower back.  Patient has have bilateral CVA tenderness.  Patient is also concerned about a white thick discharge.  Pelvic exam was  performed and cervical friability was noted. No significant discharge  Patient did have CMT.  No adnexal masses noted bilaterally.  Patient presentation is concerning for pyelonephritis, kidney stone, appendicitis, colitis, TOA and ovarian torsion Unlikely to be a gallbladder/liver disease due to location of pain.  We will still send for a CMP and lipase.  Patient has had a sick contact with her child recently.  States that she has similar symptoms concerning for URI.  He has been febrile at home up to at least 102 on Tuesday.  And again on Thursday.   UA negative for stone and infection. No leukocytosis and LA WNL making significant intraabdominal pathology unlikely. CTA of abd/pelvis was obtained to further rule out anatomical causes of her pain. CTA was unchanged from prior. Wet prep neg. Will treat for cervicitis. Will call patient if GC is positive to have her inform partner, however patient may have cervical irritation 2/2 nuvaring use. Patient had significant improvement in pain with dilaudid and toradol. Nausea improved with Zofran. At this time, patient does not have any concerning findings. Furthermore VS were not concerning for acute pain intra-abdominal  process--no tachycardia or hypotension at any point. Afebrile as well without antipyretic use recently. Discussed results, findings and plan with patient. Will Rx a short course of Zofran and encourage re-evaluation by gynecologist. All questions answered. Patient agrees with plan and states understanding. Tolerating Po. Usual and customary return  Precautions for abd pain were given. Patient and VS stable at discharge. Patient not currently lactating.  Final Clinical Impressions(s) / ED Diagnoses   Final diagnoses:  Cervicitis  Abdominal pain, unspecified abdominal location    New Prescriptions Discharge Medication List as of 01/01/2016  8:47 PM    START taking these medications   Details  ondansetron (ZOFRAN ODT) 4 MG disintegrating tablet Take 1 tablet (4 mg total) by mouth every 8 (eight) hours as needed for nausea or vomiting., Starting Sat 01/01/2016, Print         Maretta Bees, MD 01/03/16 1510    Cathren Laine, MD 01/06/16 1420

## 2016-01-03 LAB — GC/CHLAMYDIA PROBE AMP (~~LOC~~) NOT AT ARMC
CHLAMYDIA, DNA PROBE: NEGATIVE
NEISSERIA GONORRHEA: NEGATIVE

## 2016-03-13 ENCOUNTER — Emergency Department: Payer: Self-pay

## 2016-03-13 ENCOUNTER — Emergency Department
Admission: EM | Admit: 2016-03-13 | Discharge: 2016-03-13 | Disposition: A | Discharge status: Home / Self Care | Payer: Self-pay | Attending: Emergency Medicine | Admitting: Emergency Medicine

## 2016-03-13 ENCOUNTER — Encounter: Payer: Self-pay | Admitting: Emergency Medicine

## 2016-03-13 DIAGNOSIS — B9689 Other specified bacterial agents as the cause of diseases classified elsewhere: Secondary | ICD-10-CM

## 2016-03-13 DIAGNOSIS — K5900 Constipation, unspecified: Secondary | ICD-10-CM | POA: Insufficient documentation

## 2016-03-13 DIAGNOSIS — N760 Acute vaginitis: Secondary | ICD-10-CM | POA: Insufficient documentation

## 2016-03-13 DIAGNOSIS — R1031 Right lower quadrant pain: Secondary | ICD-10-CM

## 2016-03-13 DIAGNOSIS — Z79899 Other long term (current) drug therapy: Secondary | ICD-10-CM | POA: Insufficient documentation

## 2016-03-13 HISTORY — DX: Depression, unspecified: F32.A

## 2016-03-13 HISTORY — DX: Major depressive disorder, single episode, unspecified: F32.9

## 2016-03-13 LAB — POCT PREGNANCY, URINE: PREG TEST UR: NEGATIVE

## 2016-03-13 LAB — URINALYSIS COMPLETE WITH MICROSCOPIC (ARMC ONLY)
Bilirubin Urine: NEGATIVE
Glucose, UA: NEGATIVE mg/dL
HGB URINE DIPSTICK: NEGATIVE
Ketones, ur: NEGATIVE mg/dL
LEUKOCYTES UA: NEGATIVE
NITRITE: NEGATIVE
PROTEIN: NEGATIVE mg/dL
SPECIFIC GRAVITY, URINE: 1.02 (ref 1.005–1.030)
pH: 6 (ref 5.0–8.0)

## 2016-03-13 LAB — COMPREHENSIVE METABOLIC PANEL
ALBUMIN: 4.1 g/dL (ref 3.5–5.0)
ALT: 12 U/L — ABNORMAL LOW (ref 14–54)
ANION GAP: 9 (ref 5–15)
AST: 20 U/L (ref 15–41)
Alkaline Phosphatase: 51 U/L (ref 38–126)
BILIRUBIN TOTAL: 0.5 mg/dL (ref 0.3–1.2)
BUN: 11 mg/dL (ref 6–20)
CO2: 23 mmol/L (ref 22–32)
Calcium: 9.4 mg/dL (ref 8.9–10.3)
Chloride: 107 mmol/L (ref 101–111)
Creatinine, Ser: 0.63 mg/dL (ref 0.44–1.00)
GFR calc Af Amer: >60 mL/min (ref >60)
Glucose, Bld: 104 mg/dL — ABNORMAL HIGH (ref 65–99)
POTASSIUM: 4.1 mmol/L (ref 3.5–5.1)
Sodium: 139 mmol/L (ref 135–145)
TOTAL PROTEIN: 7.4 g/dL (ref 6.5–8.1)

## 2016-03-13 LAB — CBC
HEMATOCRIT: 42.5 % (ref 35.0–47.0)
HEMOGLOBIN: 14.8 g/dL (ref 12.0–16.0)
MCH: 31.3 pg (ref 26.0–34.0)
MCHC: 34.8 g/dL (ref 32.0–36.0)
MCV: 89.9 fL (ref 80.0–100.0)
Platelets: 177 10*3/uL (ref 150–440)
RBC: 4.72 MIL/uL (ref 3.80–5.20)
RDW: 12.5 % (ref 11.5–14.5)
WBC: 5.8 10*3/uL (ref 3.6–11.0)

## 2016-03-13 LAB — CHLAMYDIA/NGC RT PCR (ARMC ONLY)
CHLAMYDIA TR: NOT DETECTED
N GONORRHOEAE: NOT DETECTED

## 2016-03-13 LAB — WET PREP, GENITAL
Sperm: NONE SEEN
Trich, Wet Prep: NONE SEEN
Yeast Wet Prep HPF POC: NONE SEEN

## 2016-03-13 LAB — LIPASE, BLOOD: Lipase: 26 U/L (ref 11–51)

## 2016-03-13 MED ORDER — ONDANSETRON 4 MG PO TBDP
ORAL_TABLET | ORAL | Status: AC
  Administered 2016-03-13: 4 mg via ORAL
  Filled 2016-03-13: qty 1

## 2016-03-13 MED ORDER — IOPAMIDOL (ISOVUE-300) INJECTION 61%
100.0000 mL | Freq: Once | INTRAVENOUS | Status: AC | PRN
  Administered 2016-03-13: 100 mL via INTRAVENOUS
  Filled 2016-03-13: qty 100

## 2016-03-13 MED ORDER — SODIUM CHLORIDE 0.9 % IV BOLUS (SEPSIS)
1000.0000 mL | Freq: Once | INTRAVENOUS | Status: AC
  Administered 2016-03-13: 1000 mL via INTRAVENOUS

## 2016-03-13 MED ORDER — ONDANSETRON HCL 4 MG/2ML IJ SOLN
4.0000 mg | Freq: Once | INTRAMUSCULAR | Status: AC
  Administered 2016-03-13: 4 mg via INTRAVENOUS
  Filled 2016-03-13: qty 2

## 2016-03-13 MED ORDER — ONDANSETRON 4 MG PO TBDP
4.0000 mg | ORAL_TABLET | Freq: Once | ORAL | Status: AC
  Administered 2016-03-13: 4 mg via ORAL

## 2016-03-13 MED ORDER — METRONIDAZOLE 500 MG PO TABS: 500.0000 mg | ORAL_TABLET | Freq: Two times a day (BID) | ORAL | 0 refills | Status: AC

## 2016-03-13 MED ORDER — MORPHINE SULFATE (PF) 4 MG/ML IV SOLN
4.0000 mg | Freq: Once | INTRAVENOUS | Status: AC
  Administered 2016-03-13: 4 mg via INTRAVENOUS
  Filled 2016-03-13: qty 1

## 2016-03-13 MED ORDER — METRONIDAZOLE 500 MG PO TABS
500.0000 mg | ORAL_TABLET | Freq: Once | ORAL | Status: AC
  Administered 2016-03-13: 500 mg via ORAL

## 2016-03-13 MED ORDER — METRONIDAZOLE 500 MG PO TABS
ORAL_TABLET | ORAL | Status: AC
  Filled 2016-03-13: qty 1

## 2016-03-13 MED ORDER — DOCUSATE SODIUM 100 MG PO CAPS: 100.0000 mg | ORAL_CAPSULE | Freq: Every day | ORAL | 0 refills | Status: AC | PRN

## 2016-03-13 MED ORDER — IOPAMIDOL (ISOVUE-300) INJECTION 61%
30.0000 mL | Freq: Once | INTRAVENOUS | Status: AC
Start: 2016-03-13 — End: 2016-03-13
  Administered 2016-03-13: 30 mL via ORAL
  Filled 2016-03-13: qty 30

## 2016-03-13 NOTE — ED Notes (Signed)
Discharge instructions reviewed with patient. Questions fielded by this RN. Patient verbalizes understanding of instructions. Patient discharged home in stable condition per Schaevitz MD. No acute distress noted at time of discharge.   

## 2016-03-13 NOTE — ED Provider Notes (Signed)
[facility] Emergency Department Provider Note   ____________________________________________   First MD Initiated Contact with Patient 03/13/16 1637     (approximate)  I have reviewed the triage vital signs and the nursing notes.   HISTORY  Chief Complaint Abdominal Pain   HPI [name] is a 25 y.o. female with a history of anxiety as well as IBS was presenting to the emergency department today with right lower quadrant abdominal pain over the past week. She says that she is experiencing nausea and vomiting but no diarrhea. Says that she vomits about 3 times per day. Does not report any blood in her stool. Denies any vaginal bleeding or discharge but does spit these infection. She is similar pain several months ago was diagnosed with cervicitis but had a negative gonorrhea and chlamydia test at that time. She does not suspect STDs at this time. Also is complaining of dysuria. Says the pain is 10 out of 10 and radiates to her back on the right side.   Past Medical History:  Diagnosis Date  . Allergy   . Anxiety   . Anxiety and depression   . Chest pain, unspecified   . Depression   . Dyspnea   . Gluten intolerance   . Hyperventilation   . IBS (irritable bowel syndrome)   . Malaise and fatigue   . Migraine   . Panic attack   . Panic attack   . Pregnancy induced hypertension   . SVD (spontaneous vaginal delivery) 08/31/2014    Patient Active Problem List   Diagnosis Date Noted  . SVD (spontaneous vaginal delivery) 08/31/2014  . Labor and delivery indication for care or intervention 08/30/2014  . FHR (fetal heart rate) nonreactive   . [redacted] weeks gestation of pregnancy   . Encounter for fetal anatomic survey   . [redacted] weeks gestation of pregnancy   . PANIC ATTACK 10/27/2008  . ANXIETY DEPRESSION 10/27/2008    Past Surgical History:  Procedure Laterality Date  . ADENOIDECTOMY    . TONSILLECTOMY    . WISDOM TOOTH EXTRACTION      Prior to  Admission medications   Medication Sig Start Date End Date Taking? Authorizing Provider  acetaminophen (TYLENOL) 500 MG tablet Take 1,000 mg by mouth every 6 (six) hours as needed for moderate pain.    Historical Provider, MD  calcium carbonate (TUMS - DOSED IN MG ELEMENTAL CALCIUM) 500 MG chewable tablet Chew 2 tablets by mouth 3 (three) times daily as needed for indigestion or heartburn.    Historical Provider, MD  diphenhydrAMINE (BENADRYL) 25 MG tablet Take 25 mg by mouth at bedtime as needed for sleep.    Historical Provider, MD  etonogestrel-ethinyl estradiol (NUVARING) 0.12-0.015 MG/24HR vaginal ring Place 1 each vaginally every 28 (twenty-eight) days. Insert vaginally and leave in place for 3 consecutive weeks, then remove for 1 week.    Historical Provider, MD  norethindrone (NORA-BE) 0.35 MG tablet Take 1 tablet by mouth daily.    Historical Provider, MD  ondansetron (ZOFRAN ODT) 4 MG disintegrating tablet Take 1 tablet (4 mg total) by mouth every 8 (eight) hours as needed for nausea or vomiting. 01/01/16   Maretta Bees, MD  predniSONE (DELTASONE) 10 MG tablet Take 3 tablets once a day for 5 days 09/30/15   Tommi Rumps, PA-C  sertraline (ZOLOFT) 100 MG tablet Take 100 mg by mouth daily.    Historical Provider, MD    Allergies Hydrocodone and Sulfa antibiotics  Family  History  Problem Relation Age of Onset  . Hypertension Mother   . Cancer Mother   . Heart disease Maternal Grandfather     Social History Social History  Substance Use Topics  . Smoking status: Never Smoker  . Smokeless tobacco: Never Used  . Alcohol use No    Review of Systems Constitutional: No fever/chills Eyes: No visual changes. ENT: No sore throat. Cardiovascular: Denies chest pain. Respiratory: Denies shortness of breath. Gastrointestinal:   No diarrhea.  No constipation. Genitourinary: as above Musculoskeletal: Negative for back pain. Skin: Negative for rash. Neurological: Negative for  headaches, focal weakness or numbness.  10-point ROS otherwise negative.  ____________________________________________   PHYSICAL EXAM:  VITAL SIGNS: ED Triage Vitals  Enc Vitals Group     BP 03/13/16 1533 132/83     Pulse Rate 03/13/16 1533 97     Resp 03/13/16 1533 20     Temp 03/13/16 1533 97.8 F (36.6 C)     Temp Source 03/13/16 1533 Oral     SpO2 03/13/16 1533 99 %     Weight 03/13/16 1535 140 lb (63.5 kg)     Height 03/13/16 1535 5\' 5"  (1.651 m)     Head Circumference --      Peak Flow --      Pain Score 03/13/16 1535 10     Pain Loc --      Pain Edu? --      Excl. in GC? --     Constitutional: Alert and oriented. Well appearing and in no acute distress. Eyes: Conjunctivae are normal. PERRL. EOMI. Head: Atraumatic. Nose: No congestion/rhinnorhea. Mouth/Throat: Mucous membranes are moist.   Neck: No stridor.   Cardiovascular: Normal rate, regular rhythm. Grossly normal heart sounds.  Good peripheral circulation. Respiratory: Normal respiratory effort.  No retractions. Lungs CTAB. Gastrointestinal: Soft With mild-to-moderate right lower quadrant tenderness palpation. No distention. Mild right-sided CVA tenderness palpation.  Genitourinary:  Normal external genitalia without any lesions.  Vaginal exam with small amount of white discharge. Bimanual exam without CMT. Diffuse tenderness to both adnexa as well as the uterus but with slightly more tenderness to the right adnexa. No masses palpated. Tenderness is mild to moderate. Musculoskeletal: No lower extremity tenderness nor edema.  No joint effusions. Neurologic:  Normal speech and language. No gross focal neurologic deficits are appreciated. No gait instability. Skin:  Skin is warm, dry and intact. No rash noted. Psychiatric: Mood and affect are normal. Speech and behavior are normal.  ____________________________________________   LABS (all labs ordered are listed, but only abnormal results are displayed)  Labs  Reviewed  WET PREP, GENITAL - Abnormal; Notable for the following:       Result Value   Clue Cells Wet Prep HPF POC PRESENT (*)    WBC, Wet Prep HPF POC MODERATE (*)    All other components within normal limits  COMPREHENSIVE METABOLIC PANEL - Abnormal; Notable for the following:    Glucose, Bld 104 (*)    ALT 12 (*)    All other components within normal limits  URINALYSIS COMPLETEWITH MICROSCOPIC ([facility] ONLY) - Abnormal; Notable for the following:    Color, Urine YELLOW (*)    APPearance HAZY (*)    Bacteria, UA RARE (*)    Squamous Epithelial / LPF 0-5 (*)    All other components within normal limits  CHLAMYDIA/NGC RT PCR ([facility] ONLY)  LIPASE, BLOOD  CBC  POC URINE PREG, ED  POCT PREGNANCY, URINE   ____________________________________________  EKG   ____________________________________________  RADIOLOGY  CT Abdomen Pelvis W Contrast (Accession [id number]) (Order [id number])  Imaging  Date: 03/13/2016 Department: [facility] EMERGENCY DEPARTMENT Released By/Authorizing: Myrna Blazer, MD (auto-released)  Exam Information   Status Exam Begun  Exam Ended   Final [99] 03/13/2016 7:16 PM 03/13/2016 7:32 PM  PACS Images   Show images for CT Abdomen Pelvis W Contrast  Study Result   CLINICAL DATA:  Acute right lower quadrant pain, nausea and vomiting for 1 week. History of irritable bowel syndrome.  EXAM: CT ABDOMEN AND PELVIS WITH CONTRAST  TECHNIQUE: Multidetector CT imaging of the abdomen and pelvis was performed using the standard protocol following bolus administration of intravenous contrast.  CONTRAST:  ISOVUE-300 IOPAMIDOL (ISOVUE-300) INJECTION 61%  COMPARISON:  01/01/2016 and 11/14/2013 CT CT  FINDINGS: Lower chest: Right basilar dependent atelectasis. Normal size cardiac chambers. No pericardial effusion. No pulmonary consolidation or effusion.  Hepatobiliary: Homogeneous appearance without  space-occupying mass. Physiologically distended gallbladder without calculi. Mild central intrahepatic ductal distention without significant change dating back to 2015.  Pancreas: Unremarkable. No pancreatic ductal dilatation or surrounding inflammatory changes.  Spleen: Normal in size without focal abnormality.  Adrenals/Urinary Tract: Adrenal glands are unremarkable. Kidneys are normal, without renal calculi, focal lesion, or hydronephrosis. Bladder is unremarkable.  Stomach/Bowel: Normal appearing appendix. Moderate colonic stool burden without adults bowel obstruction or acute inflammation. No small bowel dilatation. Stomach is contrast filled but without significant distention.  Vascular/Lymphatic: No significant vascular findings are present. Patent portal venous system. No enlarged abdominal or pelvic lymph nodes.  Reproductive: Uterus and bilateral adnexa are unremarkable.  Other: No abdominal wall hernia or abnormality. No abdominopelvic ascites.  Musculoskeletal: Spina bifida occulta, an anatomic variant is again seen of the lower lumbar and upper sacral spine. 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  IMPRESSION: Moderate colonic stool burden. No bowel obstruction or acute inflammation. Normal appendix.   Electronically Signed   By: Tollie Eth M.D.   On: 03/13/2016 20:07     ____________________________________________   PROCEDURES  Procedure(s) performed:   Procedures  Critical Care performed:   ____________________________________________   INITIAL IMPRESSION / ASSESSMENT AND PLAN / ED COURSE  Pertinent labs & imaging results that were available during my care of the patient were reviewed by me and considered in my medical decision making (see chart for  details).  ----------------------------------------- 9:43 PM on 03/13/2016 -----------------------------------------  Patient resting comfortably at this time without any distress. No nausea or vomiting and tolerated the by mouth contrast. Patient with constipation. Says that she is to take probiotics but stopped because they were causing diarrhea. Also found to have bacterial vaginosis but will treat with Flagyl. We'll also start on Colace. The patient says that she has a history of irritable bowel syndrome that manifests itself as constipation. She says that she is to see [facility] gastroenterology but has not been in some time. Says that she no longer has insurance. I will give her the phone number for the on-call gastrologist for follow-up and also suggested follow-up back at [facility] as they have a free care system. Patient understands the plan and is willing to comply. Will be discharged home.  Clinical Course     ____________________________________________   FINAL CLINICAL IMPRESSION(S) / ED DIAGNOSES  Bacterial vaginosis. Constipation. Right lower quadrant abdominal pain.    NEW MEDICATIONS STARTED DURING THIS VISIT:  New Prescriptions   No medications on file     Note:  This document was prepared using Dragon voice recognition software and may include unintentional dictation errors.    David MattheMyrna Blazerw Schaevitz, MD 03/13/16 [phone]

## 2016-03-13 NOTE — ED Triage Notes (Signed)
C/O right lower quadrant pain that radiates to right lower back x 1 week.  Describes frequent urination.

## 2016-03-13 NOTE — ED Notes (Signed)
Dry heaving and vomiting in triage.

## 2016-06-07 ENCOUNTER — Emergency Department (HOSPITAL_COMMUNITY): Payer: Medicaid Other

## 2016-06-07 ENCOUNTER — Encounter (HOSPITAL_COMMUNITY): Payer: Self-pay | Admitting: Family Medicine

## 2016-06-07 ENCOUNTER — Emergency Department (HOSPITAL_COMMUNITY)
Admission: EM | Admit: 2016-06-07 | Discharge: 2016-06-07 | Disposition: A | Discharge status: Home / Self Care | Payer: Medicaid Other | Attending: Emergency Medicine | Admitting: Emergency Medicine

## 2016-06-07 DIAGNOSIS — J111 Influenza due to unidentified influenza virus with other respiratory manifestations: Secondary | ICD-10-CM

## 2016-06-07 DIAGNOSIS — R69 Illness, unspecified: Secondary | ICD-10-CM

## 2016-06-07 LAB — POC URINE PREG, ED: PREG TEST UR: NEGATIVE

## 2016-06-07 LAB — URINALYSIS, ROUTINE W REFLEX MICROSCOPIC
Bilirubin Urine: NEGATIVE
GLUCOSE, UA: NEGATIVE mg/dL
Hgb urine dipstick: NEGATIVE
KETONES UR: 80 mg/dL — AB
Nitrite: NEGATIVE
PROTEIN: NEGATIVE mg/dL
Specific Gravity, Urine: 1.019 (ref 1.005–1.030)
pH: 5 (ref 5.0–8.0)

## 2016-06-07 LAB — I-STAT CHEM 8, ED
BUN: 4 mg/dL — ABNORMAL LOW (ref 6–20)
CALCIUM ION: 1.13 mmol/L — AB (ref 1.15–1.40)
CHLORIDE: 108 mmol/L (ref 101–111)
Creatinine, Ser: 0.5 mg/dL (ref 0.44–1.00)
GLUCOSE: 91 mg/dL (ref 65–99)
HCT: 39 % (ref 36.0–46.0)
HEMOGLOBIN: 13.3 g/dL (ref 12.0–15.0)
POTASSIUM: 4.1 mmol/L (ref 3.5–5.1)
SODIUM: 139 mmol/L (ref 135–145)
TCO2: 19 mmol/L (ref 0–100)

## 2016-06-07 MED ORDER — ONDANSETRON HCL 4 MG/2ML IJ SOLN
4.0000 mg | Freq: Once | INTRAMUSCULAR | Status: AC
  Administered 2016-06-07: 4 mg via INTRAVENOUS
  Filled 2016-06-07: qty 2

## 2016-06-07 MED ORDER — ONDANSETRON HCL 4 MG PO TABS: 4.0000 mg | ORAL_TABLET | Freq: Three times a day (TID) | ORAL | 0 refills | Status: DC | PRN

## 2016-06-07 MED ORDER — KETOROLAC TROMETHAMINE 30 MG/ML IJ SOLN
30.0000 mg | Freq: Once | INTRAMUSCULAR | Status: AC
  Administered 2016-06-07: 30 mg via INTRAVENOUS
  Filled 2016-06-07: qty 1

## 2016-06-07 MED ORDER — OSELTAMIVIR PHOSPHATE 75 MG PO CAPS: 75.0000 mg | ORAL_CAPSULE | Freq: Two times a day (BID) | ORAL | 0 refills | Status: AC

## 2016-06-07 MED ORDER — OSELTAMIVIR PHOSPHATE 75 MG PO CAPS
75.0000 mg | ORAL_CAPSULE | Freq: Once | ORAL | Status: AC
  Administered 2016-06-07: 75 mg via ORAL
  Filled 2016-06-07: qty 1

## 2016-06-07 MED ORDER — SODIUM CHLORIDE 0.9 % IV BOLUS (SEPSIS)
1000.0000 mL | Freq: Once | INTRAVENOUS | Status: AC
  Administered 2016-06-07: 1000 mL via INTRAVENOUS

## 2016-06-07 NOTE — ED Provider Notes (Signed)
[facility]-EMERGENCY DEPT Provider Note   CSN: [id number] Arrival date & time: 06/07/16  1705     History   Chief Complaint Chief Complaint  Patient presents with  . Fever  . Emesis    HPI [name] is a 26 y.o. female.  HPI   Fever, aching all over, cough since Monday Haven't been able to keep anything down since Monday, throwing up and coughing up green phlegm. Nasal congestion and pain in both ears.  No diarrhea, but when trying to eat gets nausea and cramps.   Today threw up 6 times. Son sick as well with fever, congestion   Past Medical History:  Diagnosis Date  . Allergy   . Anxiety   . Anxiety and depression   . Chest pain, unspecified   . Depression   . Dyspnea   . Gluten intolerance   . Hyperventilation   . IBS (irritable bowel syndrome)   . Malaise and fatigue   . Migraine   . Panic attack   . Panic attack   . Pregnancy induced hypertension   . SVD (spontaneous vaginal delivery) 08/31/2014    Patient Active Problem List   Diagnosis Date Noted  . SVD (spontaneous vaginal delivery) 08/31/2014  . Labor and delivery indication for care or intervention 08/30/2014  . FHR (fetal heart rate) nonreactive   . [redacted] weeks gestation of pregnancy   . Encounter for fetal anatomic survey   . [redacted] weeks gestation of pregnancy   . PANIC ATTACK 10/27/2008  . ANXIETY DEPRESSION 10/27/2008    Past Surgical History:  Procedure Laterality Date  . ADENOIDECTOMY    . TONSILLECTOMY    . WISDOM TOOTH EXTRACTION      OB History    Gravida Para Term Preterm AB Living   1 1 1     1    SAB TAB Ectopic Multiple Live Births         0 1       Home Medications    Prior to Admission medications   Medication Sig Start Date End Date Taking? Authorizing Provider  etonogestrel-ethinyl estradiol (NUVARING) 0.12-0.015 MG/24HR vaginal ring Place 1 each vaginally every 28 (twenty-eight) days. Insert vaginally and leave in place for 3 consecutive weeks, then remove for 1 week.    Yes Historical Provider, MD  docusate sodium (COLACE) 100 MG capsule Take 1 capsule (100 mg total) by mouth daily as needed. Patient not taking: Reported on 06/07/2016 03/13/16 03/13/17  Myrna Blazer, MD  ondansetron (ZOFRAN) 4 MG tablet Take 1 tablet (4 mg total) by mouth every 8 (eight) hours as needed for nausea or vomiting. 06/07/16   Alvira Monday, MD  oseltamivir (TAMIFLU) 75 MG capsule Take 1 capsule (75 mg total) by mouth 2 (two) times daily. 06/07/16 06/12/16  Alvira Monday, MD    Family History Family History  Problem Relation Age of Onset  . Hypertension Mother   . Cancer Mother   . Heart disease Maternal Grandfather     Social History Social History  Substance Use Topics  . Smoking status: Never Smoker  . Smokeless tobacco: Never Used  . Alcohol use No     Allergies   Hydrocodone and Sulfa antibiotics   Review of Systems Review of Systems  Constitutional: Positive for appetite change, fatigue and fever.  HENT: Positive for congestion, ear pain and sore throat.   Eyes: Negative for visual disturbance.  Respiratory: Positive for cough and shortness of breath.   Cardiovascular: Positive for  chest pain (with cough).  Gastrointestinal: Positive for abdominal pain, nausea and vomiting. Negative for diarrhea.  Genitourinary: Positive for flank pain. Negative for difficulty urinating and dysuria.  Musculoskeletal: Positive for myalgias. Negative for back pain and neck pain.  Skin: Negative for rash.  Neurological: Negative for syncope and headaches.     Physical Exam Updated Vital Signs BP 125/80 (BP Location: Left Arm)   Pulse 101   Temp 100.4 F (38 C) (Oral)   Resp 16   Ht 5\' 4"  (1.626 m)   Wt 150 lb (68 kg)   LMP 05/23/2016   SpO2 99%   BMI 25.75 kg/m   Physical Exam  Constitutional: She is oriented to person, place, and time. She appears well-developed and well-nourished. She appears ill. No distress.  HENT:  Head: Normocephalic and  atraumatic.  Mouth/Throat: Oropharynx is clear and moist. No oropharyngeal exudate.  TM normal  Eyes: Conjunctivae and EOM are normal. Pupils are equal, round, and reactive to light.  Neck: Normal range of motion.  Cardiovascular: Regular rhythm, normal heart sounds and intact distal pulses.  Tachycardia present.  Exam reveals no gallop and no friction rub.   No murmur heard. Pulmonary/Chest: Effort normal and breath sounds normal. No respiratory distress. She has no wheezes. She has no rales.  Abdominal: Soft. She exhibits no distension. There is no tenderness. There is no guarding.  Musculoskeletal: She exhibits no edema or tenderness.  Neurological: She is alert and oriented to person, place, and time.  Skin: Skin is warm and dry. No rash noted. She is not diaphoretic. No erythema.  Nursing note and vitals reviewed.    ED Treatments / Results  Labs (all labs ordered are listed, but only abnormal results are displayed) Labs Reviewed  URINALYSIS, ROUTINE W REFLEX MICROSCOPIC - Abnormal; Notable for the following:       Result Value   APPearance HAZY (*)    Ketones, ur 80 (*)    Leukocytes, UA TRACE (*)    Bacteria, UA FEW (*)    Squamous Epithelial / LPF 0-5 (*)    All other components within normal limits  I-STAT CHEM 8, ED - Abnormal; Notable for the following:    BUN 4 (*)    Calcium, Ion 1.13 (*)    All other components within normal limits  POC URINE PREG, ED    EKG  EKG Interpretation  Date/Time:  Wednesday June 07 2016 18:17:52 EST Ventricular Rate:  102 PR Interval:    QRS Duration: 85 QT Interval:  315 QTC Calculation: 411 R Axis:   74 Text Interpretation:  Sinus tachycardia Baseline wander in lead(s) I II aVR Since prior ECG rate has increased Confirmed by [facility] MD, Erven Ramson ([id number]) on 06/07/2016 6:23:45 PM       Radiology Dg Chest 2 View  Result Date: 06/07/2016 CLINICAL DATA:  Fever, cough, and body aches for 3 days. EXAM: CHEST  2 VIEW COMPARISON:   09/30/2015 FINDINGS: The heart size and mediastinal contours are within normal limits. Both lungs are clear. The visualized skeletal structures are unremarkable. IMPRESSION: Negative.  No active cardiopulmonary disease. Electronically Signed   By: Myles Rosenthal M.D.   On: 06/07/2016 18:15    Procedures Procedures (including critical care time)  Medications Ordered in ED Medications  sodium chloride 0.9 % bolus 1,000 mL (0 mLs Intravenous Stopped 06/07/16 1932)  ondansetron (ZOFRAN) injection 4 mg (4 mg Intravenous Given 06/07/16 1833)  ketorolac (TORADOL) 30 MG/ML injection 30 mg (30 mg Intravenous  Given 06/07/16 1833)  oseltamivir (TAMIFLU) capsule 75 mg (75 mg Oral Given 06/07/16 1833)     Initial Impression / Assessment and Plan / ED Course  I have reviewed the triage vital signs and the nursing notes.  Pertinent labs & imaging results that were available during my care of the patient were reviewed by me and considered in my medical decision making (see chart for details).  Clinical Course    [age]year old female presents with concern for cough, nasal congestion, body aches and fever.  CXR shows no sign of pneumonia. No sign of otitis media. Doubt bacterial sinusitis given duration of symptoms. Pt tachycardic, likely secondary to dehydration in setting of emesis. Given 1L of NS, istat chem 8 shows no significant findings. Urinalysis negative. Preg negative.  Patient with sick contact in son less than 2, body aches and suspect influenza. Discussed that while personally for pt tamiflu may not be worth side effects, however given son under 2 it is reasonable to treat. Pt prefers empiric treatment over testing. Son's test returned positive. Recommend ibuprofen, tylenol and gave rx for flexeril and zofran. Patient discharged in stable condition with understanding of reasons to return.  Final Clinical Impressions(s) / ED Diagnoses   Final diagnoses:  Influenza-like illness    New  Prescriptions Discharge Medication List as of 06/07/2016  7:04 PM    START taking these medications   Details  ondansetron (ZOFRAN) 4 MG tablet Take 1 tablet (4 mg total) by mouth every 8 (eight) hours as needed for nausea or vomiting., Starting Wed 06/07/2016, Print    oseltamivir (TAMIFLU) 75 MG capsule Take 1 capsule (75 mg total) by mouth 2 (two) times daily., Starting Wed 06/07/2016, Until Mon 06/12/2016, Print         Alvira MondayErin Jamaris Biernat, MD 06/07/16 2225

## 2016-06-07 NOTE — ED Triage Notes (Signed)
Patient is complaining of fever, nausea, vomiting, cough, generalized body aches, and ear ache since Monday. Pt took Thera-Flu about 2 hours ago.

## 2016-06-07 NOTE — ED Notes (Signed)
PT DISCHARGED. INSTRUCTIONS AND PRESCRIPTIONS GIVEN. AAOX4. PT IN NO APPARENT DISTRESS. THE OPPORTUNITY TO ASK QUESTIONS WAS PROVIDED. 

## 2016-06-07 NOTE — ED Notes (Signed)
Bed: WA20 Expected date:  Expected time:  Means of arrival:  Comments: Triage 1 (2 patients)

## 2016-06-29 ENCOUNTER — Emergency Department: Payer: Medicaid Other

## 2016-06-29 ENCOUNTER — Emergency Department
Admission: EM | Admit: 2016-06-29 | Discharge: 2016-06-29 | Disposition: A | Discharge status: Home / Self Care | Payer: Medicaid Other | Attending: Emergency Medicine | Admitting: Emergency Medicine

## 2016-06-29 ENCOUNTER — Encounter: Payer: Self-pay | Admitting: Emergency Medicine

## 2016-06-29 DIAGNOSIS — N83201 Unspecified ovarian cyst, right side: Secondary | ICD-10-CM | POA: Insufficient documentation

## 2016-06-29 DIAGNOSIS — Z79899 Other long term (current) drug therapy: Secondary | ICD-10-CM | POA: Insufficient documentation

## 2016-06-29 DIAGNOSIS — N3000 Acute cystitis without hematuria: Secondary | ICD-10-CM | POA: Insufficient documentation

## 2016-06-29 DIAGNOSIS — R112 Nausea with vomiting, unspecified: Secondary | ICD-10-CM

## 2016-06-29 DIAGNOSIS — R935 Abnormal findings on diagnostic imaging of other abdominal regions, including retroperitoneum: Secondary | ICD-10-CM

## 2016-06-29 DIAGNOSIS — J45909 Unspecified asthma, uncomplicated: Secondary | ICD-10-CM | POA: Insufficient documentation

## 2016-06-29 DIAGNOSIS — R1031 Right lower quadrant pain: Secondary | ICD-10-CM

## 2016-06-29 DIAGNOSIS — R938 Abnormal findings on diagnostic imaging of other specified body structures: Secondary | ICD-10-CM | POA: Insufficient documentation

## 2016-06-29 DIAGNOSIS — R197 Diarrhea, unspecified: Secondary | ICD-10-CM

## 2016-06-29 HISTORY — DX: Unspecified asthma, uncomplicated: J45.909

## 2016-06-29 LAB — COMPREHENSIVE METABOLIC PANEL
ALT: 13 U/L — ABNORMAL LOW (ref 14–54)
ANION GAP: 7 (ref 5–15)
AST: 26 U/L (ref 15–41)
Albumin: 3.9 g/dL (ref 3.5–5.0)
Alkaline Phosphatase: 64 U/L (ref 38–126)
BUN: 10 mg/dL (ref 6–20)
CHLORIDE: 107 mmol/L (ref 101–111)
CO2: 23 mmol/L (ref 22–32)
Calcium: 9.2 mg/dL (ref 8.9–10.3)
Creatinine, Ser: 0.6 mg/dL (ref 0.44–1.00)
GFR calc Af Amer: >60 mL/min (ref >60)
Glucose, Bld: 100 mg/dL — ABNORMAL HIGH (ref 65–99)
POTASSIUM: 4.4 mmol/L (ref 3.5–5.1)
SODIUM: 137 mmol/L (ref 135–145)
Total Bilirubin: 0.8 mg/dL (ref 0.3–1.2)
Total Protein: 7 g/dL (ref 6.5–8.1)

## 2016-06-29 LAB — URINALYSIS, COMPLETE (UACMP) WITH MICROSCOPIC
BILIRUBIN URINE: NEGATIVE
GLUCOSE, UA: NEGATIVE mg/dL
HGB URINE DIPSTICK: NEGATIVE
KETONES UR: NEGATIVE mg/dL
NITRITE: NEGATIVE
PROTEIN: 100 mg/dL — AB
Specific Gravity, Urine: 1.03 (ref 1.005–1.030)
pH: 5 (ref 5.0–8.0)

## 2016-06-29 LAB — PREGNANCY, URINE: PREG TEST UR: NEGATIVE

## 2016-06-29 LAB — CBC
HEMATOCRIT: 40 % (ref 35.0–47.0)
HEMOGLOBIN: 13.7 g/dL (ref 12.0–16.0)
MCH: 30.8 pg (ref 26.0–34.0)
MCHC: 34.2 g/dL (ref 32.0–36.0)
MCV: 90 fL (ref 80.0–100.0)
Platelets: 172 10*3/uL (ref 150–440)
RBC: 4.45 MIL/uL (ref 3.80–5.20)
RDW: 12.6 % (ref 11.5–14.5)
WBC: 6.4 10*3/uL (ref 3.6–11.0)

## 2016-06-29 LAB — LIPASE, BLOOD: Lipase: 24 U/L (ref 11–51)

## 2016-06-29 MED ORDER — LORAZEPAM 1 MG PO TABS
1.0000 mg | ORAL_TABLET | Freq: Once | ORAL | Status: AC
  Administered 2016-06-29: 1 mg via ORAL
  Filled 2016-06-29: qty 1

## 2016-06-29 MED ORDER — IBUPROFEN 800 MG PO TABS: 800.0000 mg | ORAL_TABLET | Freq: Three times a day (TID) | ORAL | 0 refills | Status: DC | PRN

## 2016-06-29 MED ORDER — ONDANSETRON HCL 4 MG/2ML IJ SOLN
INTRAMUSCULAR | Status: AC
  Filled 2016-06-29: qty 2

## 2016-06-29 MED ORDER — HYDROMORPHONE HCL 1 MG/ML IJ SOLN
1.0000 mg | Freq: Once | INTRAMUSCULAR | Status: AC
  Administered 2016-06-29: 1 mg via INTRAVENOUS
  Filled 2016-06-29: qty 1

## 2016-06-29 MED ORDER — IOPAMIDOL (ISOVUE-300) INJECTION 61%
100.0000 mL | Freq: Once | INTRAVENOUS | Status: AC | PRN
  Administered 2016-06-29: 100 mL via INTRAVENOUS

## 2016-06-29 MED ORDER — CEPHALEXIN 500 MG PO CAPS: 500.0000 mg | ORAL_CAPSULE | Freq: Four times a day (QID) | ORAL | 0 refills | Status: AC

## 2016-06-29 MED ORDER — ONDANSETRON HCL 4 MG/2ML IJ SOLN
4.0000 mg | Freq: Once | INTRAMUSCULAR | Status: AC
  Administered 2016-06-29: 4 mg via INTRAVENOUS
  Filled 2016-06-29: qty 2

## 2016-06-29 MED ORDER — SODIUM CHLORIDE 0.9 % IV BOLUS (SEPSIS)
1000.0000 mL | Freq: Once | INTRAVENOUS | Status: AC
  Administered 2016-06-29: 1000 mL via INTRAVENOUS

## 2016-06-29 MED ORDER — LOPERAMIDE HCL 2 MG PO TABS: 2.0000 mg | ORAL_TABLET | Freq: Four times a day (QID) | ORAL | 0 refills | Status: DC | PRN

## 2016-06-29 MED ORDER — ONDANSETRON 4 MG PO TBDP: 4.0000 mg | ORAL_TABLET | Freq: Three times a day (TID) | ORAL | 0 refills | Status: DC | PRN

## 2016-06-29 MED ORDER — OXYCODONE-ACETAMINOPHEN 5-325 MG PO TABS: 1.0000 | ORAL_TABLET | Freq: Four times a day (QID) | ORAL | 0 refills | Status: DC | PRN

## 2016-06-29 MED ORDER — IOPAMIDOL (ISOVUE-300) INJECTION 61%
30.0000 mL | Freq: Once | INTRAVENOUS | Status: AC | PRN
  Administered 2016-06-29: 30 mL via ORAL

## 2016-06-29 MED ORDER — KETOROLAC TROMETHAMINE 30 MG/ML IJ SOLN
30.0000 mg | Freq: Once | INTRAMUSCULAR | Status: AC
  Administered 2016-06-29: 30 mg via INTRAVENOUS
  Filled 2016-06-29: qty 1

## 2016-06-29 MED ORDER — ONDANSETRON HCL 4 MG/2ML IJ SOLN
4.0000 mg | Freq: Once | INTRAMUSCULAR | Status: AC
  Administered 2016-06-29: 4 mg via INTRAVENOUS

## 2016-06-29 NOTE — ED Notes (Addendum)
Pt ambulatory to ER, given wheelchair upon arrival.   In NAD. RR even and unlabored.

## 2016-06-29 NOTE — ED Triage Notes (Signed)
Pt reports RLQ abdominal pain for two days. Pt reports one episode of diarrhea two days ago. Pt reports one episode of projectile vomiting yesterday. Pt reports history of IBS.

## 2016-06-29 NOTE — ED Notes (Signed)
Pt has drank as much contrast as she can stand, threw up once. Verbal orders received. Dr. Sharma CovertNorman ok for pt to go to CT without finishing contrast.

## 2016-06-29 NOTE — ED Notes (Signed)
Pt states she is SOB and has chest tightness. VS stable. EKG done, MD notified

## 2016-06-29 NOTE — ED Notes (Signed)
Pt taken to CT via stretcher.

## 2016-06-29 NOTE — ED Notes (Signed)
Pt has finished first bottle of contrast and started on second.

## 2016-06-29 NOTE — ED Notes (Signed)
Called lab to have them run urine pregnancy since urine was sent down already.

## 2016-06-29 NOTE — Discharge Instructions (Signed)
Today, your found to have some abnormalities in the lining of your uterus, as well as a cyst in the right ovary. You will need to get repeat imaging, ultrasound, to have this reevaluated in 6 weeks. Please make a follow-up appointment with your gynecologist in the next 2-3 days for reevaluation, and they will help reschedule your ultrasound.  You may take Motrin or Tylenol for mild to moderate pain, and Percocet for severe pain. Do not drive within 8 hours of taking Percocet. Zofran is for nausea. Loperamide is for diarrhea.  Return to the emergency department if you develop severe pain, inability to keep down fluids, lightheadedness, or any other symptoms concerning to you.

## 2016-06-29 NOTE — ED Notes (Signed)
Pt states generalized abd pain that feels like "pushing, like its pushing out my uterus." Pt states "projectile vomiting" yesterday with temp of 100.1 and diarrhea on Tuesday Pain began Monday. Pt still has all belly organs. Hx IBS with constipation. Does not take medicine for that. Pt states "I think it's diverticulitis."

## 2016-06-29 NOTE — ED Provider Notes (Signed)
[facility] Emergency Department Provider Note  ____________________________________________  Time seen: Approximately 12:13 PM  I have reviewed the triage vital signs and the nursing notes.   HISTORY  Chief Complaint Abdominal Pain    HPI [name] is a 26 y.o. female history of IBS presenting with nausea and vomiting, diarrhea, and abdominal pain. The patient reports that at baseline, she is chronically constipated and requires full stool softeners daily. She also has intermittent cramping. She chronically has nausea and occasional intermittent vomiting. However, for the past 3 days, the patient has been having watery nonbloody diarrhea, and yesterday developed nausea and vomiting that was greater in frequency and volume than baseline. She is also had diffuse abdominal pain but notes worsening pain in the right lower quadrant. She has also had a temperature up to 100.1 at home. She is treated her symptoms with Tylenol and Motrin with some improvement. No dysuria, vaginal discharge, or vaginal bleeding. LMP 1/31; patient states she has not been able to fill her NuvaRing prescription but has continued to be sexual active.No cough or cold symptoms   Past Medical History:  Diagnosis Date  . Allergy   . Anxiety   . Anxiety and depression   . Asthma   . Chest pain, unspecified   . Depression   . Dyspnea   . Gluten intolerance   . Hyperventilation   . IBS (irritable bowel syndrome)   . Malaise and fatigue   . Migraine   . Panic attack   . Panic attack   . Pregnancy induced hypertension   . SVD (spontaneous vaginal delivery) 08/31/2014    Patient Active Problem List   Diagnosis Date Noted  . SVD (spontaneous vaginal delivery) 08/31/2014  . Labor and delivery indication for care or intervention 08/30/2014  . FHR (fetal heart rate) nonreactive   . [redacted] weeks gestation of pregnancy   . Encounter for fetal anatomic survey   . [redacted] weeks gestation of  pregnancy   . PANIC ATTACK 10/27/2008  . ANXIETY DEPRESSION 10/27/2008    Past Surgical History:  Procedure Laterality Date  . ADENOIDECTOMY    . TONSILLECTOMY    . WISDOM TOOTH EXTRACTION      Current Outpatient Rx  . Order #: [id number] Class: Print  . Order #: [id number] Class: Print  . Order #: [id number] Class: Historical Med  . Order #: [id number] Class: Print  . Order #: [id number] Class: Print  . Order #: [id number] Class: Print  . Order #: [id number] Class: Print  . Order #: [id number] Class: Print    Allergies Hydrocodone and Sulfa antibiotics  Family History  Problem Relation Age of Onset  . Hypertension Mother   . Cancer Mother   . Heart disease Maternal Grandfather     Social History Social History  Substance Use Topics  . Smoking status: Never Smoker  . Smokeless tobacco: Never Used  . Alcohol use No    Review of Systems Constitutional: Positive fever. Positive general malaise. Eyes: No visual changes. ENT: No sore throat. No congestion or rhinorrhea. Cardiovascular: Denies chest pain. Denies palpitations. Respiratory: Denies shortness of breath.  No cough. Gastrointestinal: Positive diffuse but prominent right lower quadrant abdominal pain.  Positive nausea, positive vomiting.  No diarrhea.  No constipation. Genitourinary: Negative for dysuria. No change in vaginal discharge. Musculoskeletal: Negative for back pain. Skin: Negative for rash. Neurological: Negative for headaches. No focal numbness, tingling or weakness.   10-point ROS otherwise negative.  ____________________________________________   PHYSICAL EXAM:  VITAL SIGNS: ED Triage Vitals  Enc Vitals Group     BP 06/29/16 1014 (!) 156/78     Pulse Rate 06/29/16 1014 100     Resp 06/29/16 1014 18     Temp 06/29/16 1014 98 F (36.7 C)     Temp Source 06/29/16 1014 Oral     SpO2 06/29/16 1014 99 %     Weight 06/29/16 1015 150 lb (68 kg)     Height 06/29/16 1015 5\' 4"  (1.626 m)     Head  Circumference --      Peak Flow --      Pain Score 06/29/16 1015 9     Pain Loc --      Pain Edu? --      Excl. in GC? --     Constitutional: Alert and oriented. Well appearing and in no acute distress. Answers questions appropriately. Eyes: Conjunctivae are normal.  EOMI. No scleral icterus. Head: Atraumatic. Nose: No congestion/rhinnorhea. Mouth/Throat: Mucous membranes are moist.  Neck: No stridor.  Supple.  No meningismus. Cardiovascular: Normal rate, regular rhythm. No murmurs, rubs or gallops.  Respiratory: Normal respiratory effort.  No accessory muscle use or retractions. Lungs CTAB.  No wheezes, rales or ronchi. Gastrointestinal: Soft, and nondistended.  Diffuse tenderness to palpation with focality in the right lower quadrant. Negative Murphy sign. No guarding or rebound.  No peritoneal signs. Musculoskeletal: No LE edema.  Neurologic:  A&Ox3.  Speech is clear.  Face and smile are symmetric.  EOMI.  Moves all extremities well. Skin:  Skin is warm, dry and intact. No rash noted. Psychiatric: Mood and affect are normal. Speech and behavior are normal.  Normal judgement.  ____________________________________________   LABS (all labs ordered are listed, but only abnormal results are displayed)  Labs Reviewed  COMPREHENSIVE METABOLIC PANEL - Abnormal; Notable for the following:       Result Value   Glucose, Bld 100 (*)    ALT 13 (*)    All other components within normal limits  URINALYSIS, COMPLETE (UACMP) WITH MICROSCOPIC - Abnormal; Notable for the following:    Color, Urine YELLOW (*)    APPearance CLOUDY (*)    Protein, ur 100 (*)    Leukocytes, UA TRACE (*)    Bacteria, UA MANY (*)    Squamous Epithelial / LPF 6-30 (*)    All other components within normal limits  LIPASE, BLOOD  CBC  PREGNANCY, URINE   ____________________________________________  EKG  Not indicated ____________________________________________  RADIOLOGY  US Transvaginal  Non-ob  Result Date: 06/29/2016 CLINICAL DATA:  Right lower abdominal and pelvic pain. Vaginal discharge EXAM: TRANSABDOMINAL AND TRANSVAGINAL ULTRASOUND OF PELVIS TECHNIQUE: Study was performed transabdominally to optimize pelvic field of view evaluation and transvaginally to optimize internal visceral architecture evaluation. COMPARISON:  CT abdomen and pelvis June 29, 2016 FINDINGS: Uterus Measurements: 6.8 x 3.5 x 4.5 cm. Uterus is mildly retroverted. No fibroids or other mass visualized. Endometrium Thickness: 9 mm. The endometrium has a mildly inhomogeneous echotexture with tiny calcifications and scattered areas of mild cystic change and fluid. Largest cyst measures 2 mm. Right ovary Measurements: 3.0 x 2.9 x 2.7 cm. There is a cystic mass arising from the right ovary which contains a few septations, possibly a hemorrhagic follicle. No other right-sided pelvic mass. Left ovary Measurements: 3.0 x 1.1 x 1.0 cm. Normal appearance/no adnexal mass. Other findings No abnormal free fluid. IMPRESSION: The endometrium is not thickened for age. However, the endometrium has a mildly complex echotexture pattern with tiny calcifications and  mild cystic change. Given this appearance, a follow-up ultrasound in 6 to 8 weeks, ideally at the end of menstrual cycle, advised to further evaluate. Septated cystic lesion right ovary, probably a hemorrhagic cyst/dominant follicle. Short-interval follow up ultrasound in 6-12 weeks is recommended, preferably during the week following the patient's normal menses. Given the endometrium appearance, reimaging closer to 6 weeks as opposed to 12 weeks may be warranted. Study otherwise unremarkable. Electronically Signed   By: Bretta Bang III M.D.   On: 06/29/2016 16:35   US Pelvis Complete  Result Date: 06/29/2016 CLINICAL DATA:  Right lower abdominal and pelvic pain. Vaginal discharge EXAM: TRANSABDOMINAL AND TRANSVAGINAL ULTRASOUND OF PELVIS TECHNIQUE: Study was performed  transabdominally to optimize pelvic field of view evaluation and transvaginally to optimize internal visceral architecture evaluation. COMPARISON:  CT abdomen and pelvis June 29, 2016 FINDINGS: Uterus Measurements: 6.8 x 3.5 x 4.5 cm. Uterus is mildly retroverted. No fibroids or other mass visualized. Endometrium Thickness: 9 mm. The endometrium has a mildly inhomogeneous echotexture with tiny calcifications and scattered areas of mild cystic change and fluid. Largest cyst measures 2 mm. Right ovary Measurements: 3.0 x 2.9 x 2.7 cm. There is a cystic mass arising from the right ovary which contains a few septations, possibly a hemorrhagic follicle. No other right-sided pelvic mass. Left ovary Measurements: 3.0 x 1.1 x 1.0 cm. Normal appearance/no adnexal mass. Other findings No abnormal free fluid. IMPRESSION: The endometrium is not thickened for age. However, the endometrium has a mildly complex echotexture pattern with tiny calcifications and mild cystic change. Given this appearance, a follow-up ultrasound in 6 to 8 weeks, ideally at the end of menstrual cycle, advised to further evaluate. Septated cystic lesion right ovary, probably a hemorrhagic cyst/dominant follicle. Short-interval follow up ultrasound in 6-12 weeks is recommended, preferably during the week following the patient's normal menses. Given the endometrium appearance, reimaging closer to 6 weeks as opposed to 12 weeks may be warranted. Study otherwise unremarkable. Electronically Signed   By: Bretta Bang III M.D.   On: 06/29/2016 16:35   Ct Abdomen Pelvis W Contrast  Result Date: 06/29/2016 CLINICAL DATA:  Abdominal pain with nausea and vomiting EXAM: CT ABDOMEN AND PELVIS WITH CONTRAST TECHNIQUE: Multidetector CT imaging of the abdomen and pelvis was performed using the standard protocol following bolus administration of intravenous contrast. Oral contrast was also administered. CONTRAST:  ISOVUE-300 IOPAMIDOL (ISOVUE-300)  INJECTION 61% COMPARISON:  March 13, 2016 FINDINGS: Lower chest: Lung bases are clear. Hepatobiliary: Liver measures 19.2 cm in length. No focal liver lesions are appreciable. Gallbladder wall is not thickened. There is no biliary duct dilatation. Pancreas: No pancreatic mass or inflammatory focus. Spleen: No splenic lesions are appreciable. Adrenals/Urinary Tract: Adrenals appear normal bilaterally. There is a mass arising in the lower pole of the left kidney measuring 1.3 x 1.2 cm which has attenuation values higher than is expected with a simple cyst. No other evident renal mass. There is no hydronephrosis on either side. There is no renal or ureteral calculus on either side. Urinary bladder is midline with wall thickness within normal limits. Stomach/Bowel: There is no appreciable bowel wall or mesenteric thickening. No bowel obstruction. No free air or portal venous air. Vascular/Lymphatic: There is no abdominal aortic aneurysm. No vascular lesions are appreciable. There is no adenopathy in the abdomen or pelvis. Reproductive: Uterus is anteverted. Endometrium appears somewhat thickened. There is a dominant follicle in the right ovary measuring 2 x 2 cm. No other pelvic mass evident.  A small amount of fluid is noted in the rightward aspect of the cul-de-sac. Other: Appendix appears normal. No abscess in the abdomen or pelvis. There is a minimal ventral hernia containing only fat. 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 No lytic or destructive bone lesions. No intramuscular or abdominal wall lesions evident. IMPRESSION: There is a 1.3 x 1.2 cm mass in the lower pole left kidney which has attenuation values higher than is expected with a cyst. Further evaluation with pre and post contrast MRI should be considered nonemergently. Pre and post contrast CT could alternatively be performed, but would likely be of decreased accuracy given lesion size.  Prominent liver without focal lesion. Somewhat thickened endometrium. Significance of this finding is uncertain. It may warrant pelvic ultrasound to further evaluation. Small amount of free fluid in cul-de-sac may be indicative of recent ovarian cyst leakage or rupture. Previous repair of unicameral bone cyst in proximal right femur, incompletely visualized currently. No bowel obstruction. No abscess. Appendix appears normal. No renal or ureteral calculus. No hydronephrosis. Electronically Signed   By: Bretta Bang III M.D.   On: 06/29/2016 14:12    ____________________________________________   PROCEDURES  Procedure(s) performed: None  Procedures  Critical Care performed: No ____________________________________________   INITIAL IMPRESSION / ASSESSMENT AND PLAN / ED COURSE  Pertinent labs & imaging results that were available during my care of the patient were reviewed by me and considered in my medical decision making (see chart for details).  26 y.o. female with a history of IBS presenting with nausea vomiting and diarrhea, right lower quadrant pain, that is different than her usual gastrointestinal symptoms. Overall, the patient has stable vital signs but does have focal right lower quadrant tenderness. I'll consider appendicitis, intestinal infection, or a typical flu without any URI symptoms. Mesenteric adenitis is also possible. If her CT scan is negative, I'll reevaluate her and consider GU pathology including ovarian cyst, although this would be less likely to cause diarrhea and fever.  ----------------------------------------- 3:23 PM on 06/29/2016 -----------------------------------------  The patient continues to be hemodynamically stable. She states her pain was initially improved, but now is coming back. She is also feeling anxious, and has a history of panic attacks in the past. I'll plan to treat her pain and her anxiety. Her CT scan shows a mass in the left kidney  which is an incidental finding and will need outpatient follow-up; a somewhat thickened endometrium and possible small amount of free fluid in the cul-de-sac. We will follow this with a pelvic ultrasound, and reevaluate the patient for final disposition.  ----------------------------------------- 4:13 PM on 06/29/2016 -----------------------------------------  The patient told the nurse that she was experiencing chest pain, and on my examination she stated that she was feeling short of breath. She also reported that she was feeling anxious, so I'll treat her for that and reevaluate her.  ED ECG REPORT I, Rockne Menghini, the attending physician, personally viewed and interpreted this ECG.   Date: 06/29/2016  EKG Time: 1514  Rate: 78  Rhythm: normal sinus rhythm  Axis: normal  Intervals:none  ST&T Change: Nonspecific T-wave inversion in V1. No STEMI  ----------------------------------------- 5:10 PM on 06/29/2016 -----------------------------------------  At this time, the patient is feeling significantly better. Her pain has resolved and she is no longer nauseated. She is able to keep down liquid. Her ultrasound does show a right hemorrhagic cyst, and I have let her know this needs to be reimaged. I  have also discussed the abnormal findings on the endometrial stripe, which she can have reimaged simultaneously. She understands return precautions as well as follow-up instructions. ____________________________________________  FINAL CLINICAL IMPRESSION(S) / ED DIAGNOSES  Final diagnoses:  Hemorrhagic cyst of right ovary  Abnormal ultrasound of endometrium  Acute cystitis without hematuria  Nausea vomiting and diarrhea         NEW MEDICATIONS STARTED DURING THIS VISIT:  New Prescriptions   CEPHALEXIN (KEFLEX) 500 MG CAPSULE    Take 1 capsule (500 mg total) by mouth 4 (four) times daily.   IBUPROFEN (ADVIL,MOTRIN) 800 MG TABLET    Take 1 tablet (800 mg total) by mouth  every 8 (eight) hours as needed for mild pain or moderate pain (with food).   LOPERAMIDE (IMODIUM A-D) 2 MG TABLET    Take 1 tablet (2 mg total) by mouth 4 (four) times daily as needed for diarrhea or loose stools.   ONDANSETRON (ZOFRAN ODT) 4 MG DISINTEGRATING TABLET    Take 1 tablet (4 mg total) by mouth every 8 (eight) hours as needed for nausea or vomiting.   OXYCODONE-ACETAMINOPHEN (ROXICET) 5-325 MG TABLET    Take 1 tablet by mouth every 6 (six) hours as needed.      Rockne MenghiniAnne-Caroline Sarahlynn Cisnero, MD 06/29/16 1711

## 2016-07-05 ENCOUNTER — Encounter (HOSPITAL_COMMUNITY): Payer: Self-pay

## 2016-07-05 ENCOUNTER — Emergency Department (HOSPITAL_COMMUNITY)
Admission: EM | Admit: 2016-07-05 | Discharge: 2016-07-05 | Disposition: A | Discharge status: Home / Self Care | Payer: Medicaid Other | Attending: Emergency Medicine | Admitting: Emergency Medicine

## 2016-07-05 DIAGNOSIS — J45909 Unspecified asthma, uncomplicated: Secondary | ICD-10-CM | POA: Insufficient documentation

## 2016-07-05 DIAGNOSIS — R112 Nausea with vomiting, unspecified: Secondary | ICD-10-CM

## 2016-07-05 DIAGNOSIS — Z79899 Other long term (current) drug therapy: Secondary | ICD-10-CM | POA: Insufficient documentation

## 2016-07-05 DIAGNOSIS — R197 Diarrhea, unspecified: Secondary | ICD-10-CM | POA: Insufficient documentation

## 2016-07-05 LAB — CBC
HEMATOCRIT: 44.2 % (ref 36.0–46.0)
Hemoglobin: 15.6 g/dL — ABNORMAL HIGH (ref 12.0–15.0)
MCH: 31.1 pg (ref 26.0–34.0)
MCHC: 35.3 g/dL (ref 30.0–36.0)
MCV: 88 fL (ref 78.0–100.0)
PLATELETS: 174 10*3/uL (ref 150–400)
RBC: 5.02 MIL/uL (ref 3.87–5.11)
RDW: 12.5 % (ref 11.5–15.5)
WBC: 7.2 10*3/uL (ref 4.0–10.5)

## 2016-07-05 LAB — COMPREHENSIVE METABOLIC PANEL
ALBUMIN: 4.3 g/dL (ref 3.5–5.0)
ALT: 13 U/L — AB (ref 14–54)
AST: 19 U/L (ref 15–41)
Alkaline Phosphatase: 69 U/L (ref 38–126)
Anion gap: 14 (ref 5–15)
BUN: 13 mg/dL (ref 6–20)
CO2: 16 mmol/L — ABNORMAL LOW (ref 22–32)
CREATININE: 0.58 mg/dL (ref 0.44–1.00)
Calcium: 10.1 mg/dL (ref 8.9–10.3)
Chloride: 106 mmol/L (ref 101–111)
GFR calc Af Amer: >60 mL/min (ref >60)
GFR calc non Af Amer: >60 mL/min (ref >60)
GLUCOSE: 116 mg/dL — AB (ref 65–99)
POTASSIUM: 4.3 mmol/L (ref 3.5–5.1)
Sodium: 136 mmol/L (ref 135–145)
Total Bilirubin: 1.1 mg/dL (ref 0.3–1.2)
Total Protein: 7.5 g/dL (ref 6.5–8.1)

## 2016-07-05 LAB — DIFFERENTIAL
BASOS ABS: 0 10*3/uL (ref 0.0–0.1)
Basophils Relative: 0 %
EOS ABS: 0 10*3/uL (ref 0.0–0.7)
Eosinophils Relative: 0 %
LYMPHS ABS: 0.7 10*3/uL (ref 0.7–4.0)
LYMPHS PCT: 10 %
MONOS PCT: 4 %
Monocytes Absolute: 0.3 10*3/uL (ref 0.1–1.0)
NEUTROS ABS: 5.7 10*3/uL (ref 1.7–7.7)
NEUTROS PCT: 86 %

## 2016-07-05 LAB — URINALYSIS, ROUTINE W REFLEX MICROSCOPIC
Bilirubin Urine: NEGATIVE
Glucose, UA: NEGATIVE mg/dL
Hgb urine dipstick: NEGATIVE
Ketones, ur: 5 mg/dL — AB
Leukocytes, UA: NEGATIVE
Nitrite: NEGATIVE
PH: 5 (ref 5.0–8.0)
Protein, ur: 30 mg/dL — AB
SPECIFIC GRAVITY, URINE: 1.027 (ref 1.005–1.030)

## 2016-07-05 LAB — LIPASE, BLOOD: LIPASE: 15 U/L (ref 11–51)

## 2016-07-05 LAB — PREGNANCY, URINE: Preg Test, Ur: NEGATIVE

## 2016-07-05 MED ORDER — ONDANSETRON 4 MG PO TBDP
ORAL_TABLET | ORAL | Status: AC
  Filled 2016-07-05: qty 1

## 2016-07-05 MED ORDER — SODIUM CHLORIDE 0.9 % IV BOLUS (SEPSIS)
1000.0000 mL | Freq: Once | INTRAVENOUS | Status: AC
  Administered 2016-07-05: 1000 mL via INTRAVENOUS

## 2016-07-05 MED ORDER — PROMETHAZINE HCL 25 MG/ML IJ SOLN
25.0000 mg | Freq: Once | INTRAMUSCULAR | Status: AC
Start: 2016-07-05 — End: 2016-07-05
  Administered 2016-07-05: 25 mg via INTRAVENOUS
  Filled 2016-07-05: qty 1

## 2016-07-05 MED ORDER — PROMETHAZINE HCL 25 MG PO TABS: 25.0000 mg | ORAL_TABLET | Freq: Four times a day (QID) | ORAL | 0 refills | Status: DC | PRN

## 2016-07-05 MED ORDER — KETOROLAC TROMETHAMINE 30 MG/ML IJ SOLN
30.0000 mg | Freq: Once | INTRAMUSCULAR | Status: AC
Start: 2016-07-05 — End: 2016-07-05
  Administered 2016-07-05: 30 mg via INTRAVENOUS
  Filled 2016-07-05: qty 1

## 2016-07-05 MED ORDER — ONDANSETRON 4 MG PO TBDP
4.0000 mg | ORAL_TABLET | Freq: Once | ORAL | Status: AC | PRN
  Administered 2016-07-05: 4 mg via ORAL

## 2016-07-05 MED ORDER — LOPERAMIDE HCL 2 MG PO CAPS: 2.0000 mg | ORAL_CAPSULE | Freq: Four times a day (QID) | ORAL | 0 refills | Status: DC | PRN

## 2016-07-05 NOTE — ED Triage Notes (Signed)
Pt reports vomiting and diarrhea that began at 0230 this morning. Pt states she is taking abx for UTI.

## 2016-07-05 NOTE — ED Provider Notes (Signed)
[facility]-EMERGENCY DEPT Provider Note   CSN: [id number] Arrival date & time: 07/05/16  1634  By signing my name below, I, [name], attest that this documentation has been prepared under the direction and in the presence of Peggy Guiseana Duo Katryna Tschirhart, MD. Electronically Signed: Rosario AdieWilliam Andrew Guzman, ED Scribe. 07/05/16. 5:50 PM.  History   Chief Complaint Chief Complaint  Patient presents with  . Emesis  . Diarrhea   The history is provided by the patient. No language interpreter was used.    HPI Comments: [name] is a 26 y.o. female with a h/o IBS, who presents to the Emergency Department complaining of episodic vomiting and diarrhea beginning this morning around 0230. She notes associated nausea, congestion, chills, rhinorrhea, lower abdominal pain, and lower back pain as well. Pt was seen in the ED for same 06/29/16 (~6 days ago) and at that time she was dx'd with a UTI. She was prescribed Keflex for her UTI and would have finished the course today. Her symptoms from this visit has resolved completely, but returned last night. She did have a CT A/p and a pelvic US performed at her prior ED visit. Her US was remarkable for a cyst of the right ovary, but her CT A/p was otherwise benign for abdominal pathology. She also reports that she has been having intermitted bloody and white vaginal discharge over the past several days as well. Patient's last menstrual period was 06/21/2016; however, she notes that she recently discontinued her birth control last month. Pt has a h/o IBS and notes that she is constipated toward her baseline, and her symptoms today are similar to flare-ups of this. No PSHx to the abdomen. No known sick contacts with similar symptoms. She denies cough, sore throat, fever, hematemesis, hematochezia, or any other associated symptoms.   Past Medical History:  Diagnosis Date  . Allergy   . Anxiety   . Anxiety and depression   . Asthma   . Chest pain, unspecified   .  Depression   . Dyspnea   . Gluten intolerance   . Hyperventilation   . IBS (irritable bowel syndrome)   . Malaise and fatigue   . Migraine   . Panic attack   . Panic attack   . Pregnancy induced hypertension   . SVD (spontaneous vaginal delivery) 08/31/2014   Patient Active Problem List   Diagnosis Date Noted  . SVD (spontaneous vaginal delivery) 08/31/2014  . Labor and delivery indication for care or intervention 08/30/2014  . FHR (fetal heart rate) nonreactive   . [redacted] weeks gestation of pregnancy   . Encounter for fetal anatomic survey   . [redacted] weeks gestation of pregnancy   . PANIC ATTACK 10/27/2008  . ANXIETY DEPRESSION 10/27/2008   Past Surgical History:  Procedure Laterality Date  . ADENOIDECTOMY    . TONSILLECTOMY    . WISDOM TOOTH EXTRACTION     OB History    Gravida Para Term Preterm AB Living   1 1 1     1    SAB TAB Ectopic Multiple Live Births         0 1     Home Medications    Prior to Admission medications   Medication Sig Start Date End Date Taking? Authorizing Provider  cephALEXin (KEFLEX) 500 MG capsule Take 1 capsule (500 mg total) by mouth 4 (four) times daily. 06/29/16 07/09/16  Rockne MenghiniAnne-Caroline Norman, MD  docusate sodium (COLACE) 100 MG capsule Take 1 capsule (100 mg total) by mouth  daily as needed. Patient not taking: Reported on 06/07/2016 03/13/16 03/13/17  Myrna Blazer, MD  etonogestrel-ethinyl estradiol (NUVARING) 0.12-0.015 MG/24HR vaginal ring Place 1 each vaginally every 28 (twenty-eight) days. Insert vaginally and leave in place for 3 consecutive weeks, then remove for 1 week.    Historical Provider, MD  ibuprofen (ADVIL,MOTRIN) 800 MG tablet Take 1 tablet (800 mg total) by mouth every 8 (eight) hours as needed for mild pain or moderate pain (with food). 06/29/16   Rockne Menghini, MD  loperamide (IMODIUM A-D) 2 MG tablet Take 1 tablet (2 mg total) by mouth 4 (four) times daily as needed for diarrhea or loose stools. 06/29/16    Rockne Menghini, MD  loperamide (IMODIUM) 2 MG capsule Take 1 capsule (2 mg total) by mouth 4 (four) times daily as needed for diarrhea or loose stools. 07/05/16   Peggy Guise, MD  ondansetron (ZOFRAN ODT) 4 MG disintegrating tablet Take 1 tablet (4 mg total) by mouth every 8 (eight) hours as needed for nausea or vomiting. 06/29/16   Anne-Caroline Sharma Covert, MD  ondansetron (ZOFRAN) 4 MG tablet Take 1 tablet (4 mg total) by mouth every 8 (eight) hours as needed for nausea or vomiting. 06/07/16   Alvira Monday, MD  oxyCODONE-acetaminophen (ROXICET) 5-325 MG tablet Take 1 tablet by mouth every 6 (six) hours as needed. 06/29/16 06/29/17  Rockne Menghini, MD  promethazine (PHENERGAN) 25 MG tablet Take 1 tablet (25 mg total) by mouth every 6 (six) hours as needed for nausea or vomiting. 07/05/16   Peggy Guise, MD   Family History Family History  Problem Relation Age of Onset  . Hypertension Mother   . Cancer Mother   . Heart disease Maternal Grandfather    Social History Social History  Substance Use Topics  . Smoking status: Never Smoker  . Smokeless tobacco: Never Used  . Alcohol use No   Allergies   Hydrocodone and Sulfa antibiotics  Review of Systems Review of Systems 10/14 systems reviewed and are negative other than those stated in the HPI  Physical Exam Updated Vital Signs BP 119/75   Pulse 90   Temp 98.2 F (36.8 C) (Oral)   Resp 18   LMP 06/21/2016   SpO2 97%   Physical Exam Physical Exam  Nursing note and vitals reviewed. Constitutional: Well developed, well nourished, non-toxic, and in no acute distress Head: Normocephalic and atraumatic.  Mouth/Throat: Oropharynx is clear and moist.  Neck: Normal range of motion. Neck supple.  Cardiovascular: Normal rate and regular rhythm.   Pulmonary/Chest: Effort normal and breath sounds normal.  Abdominal: Soft. There is low abdominal tenderness. There is no rebound and no guarding.  Musculoskeletal: Normal range of  motion.  Neurological: Alert, no facial droop, fluent speech, moves all extremities symmetrically Skin: Skin is warm and dry.  Psychiatric: Cooperative  ED Treatments / Results  DIAGNOSTIC STUDIES: Oxygen Saturation is 100% on RA, normal by my interpretation.   COORDINATION OF CARE: 5:50 PM-Discussed next steps with pt. Pt verbalized understanding and is agreeable with the plan.   Labs (all labs ordered are listed, but only abnormal results are displayed) Labs Reviewed  COMPREHENSIVE METABOLIC PANEL - Abnormal; Notable for the following:       Result Value   CO2 16 (*)    Glucose, Bld 116 (*)    ALT 13 (*)    All other components within normal limits  CBC - Abnormal; Notable for the following:    Hemoglobin 15.6 (*)  All other components within normal limits  URINALYSIS, ROUTINE W REFLEX MICROSCOPIC - Abnormal; Notable for the following:    Color, Urine AMBER (*)    APPearance CLOUDY (*)    Ketones, ur 5 (*)    Protein, ur 30 (*)    Bacteria, UA MANY (*)    Squamous Epithelial / LPF 6-30 (*)    All other components within normal limits  URINE CULTURE  LIPASE, BLOOD  DIFFERENTIAL  PREGNANCY, URINE   EKG  EKG Interpretation None      Radiology No results found.  Procedures Procedures   Medications Ordered in ED Medications  ondansetron (ZOFRAN-ODT) 4 MG disintegrating tablet (not administered)  ondansetron (ZOFRAN-ODT) disintegrating tablet 4 mg (4 mg Oral Given 07/05/16 1644)  sodium chloride 0.9 % bolus 1,000 mL (0 mLs Intravenous Stopped 07/05/16 1951)  promethazine (PHENERGAN) injection 25 mg (25 mg Intravenous Given 07/05/16 1809)  ketorolac (TORADOL) 30 MG/ML injection 30 mg (30 mg Intravenous Given 07/05/16 1809)   Initial Impression / Assessment and Plan / ED Course  I have reviewed the triage vital signs and the nursing notes.  Pertinent labs & imaging results that were available during my care of the patient were reviewed by me and considered in my  medical decision making (see chart for details).     26 year old female who presents with nausea, vomiting, and diarrhea starting this morning. She is nontoxic and in no acute distress. Initially with some tachycardia in the triage, but without receiving therapy yet heart rate 89 and her ED room. She does mildly dry on exam. Abdomen is nonsurgical and soft. Low supsicion for Blood work otherwise reassuring but she does have low bicarbonate of 16, likely in the setting of her diarrheal losses. Received IV fluids, antibiotics, and her symptoms improved. Recently just finished a course of antibiotics for UTI, and UA is not concerning for infection today. She is also not pregnant. Just had CT scan and ultrasound pelvis last week that were overall unremarkable aside from corpus luteum cyst of the right ovary. Do not feel that further imaging is necessary at this time. Since she symptomatically feels improved will discharge home with supportive care management. Strict return and follow-up instructions reviewed. She expressed understanding of all discharge instructions and felt comfortable with the plan of care.   Final Clinical Impressions(s) / ED Diagnoses   Final diagnoses:  Nausea vomiting and diarrhea   New Prescriptions New Prescriptions   LOPERAMIDE (IMODIUM) 2 MG CAPSULE    Take 1 capsule (2 mg total) by mouth 4 (four) times daily as needed for diarrhea or loose stools.   PROMETHAZINE (PHENERGAN) 25 MG TABLET    Take 1 tablet (25 mg total) by mouth every 6 (six) hours as needed for nausea or vomiting.  I personally performed the services described in this documentation, which was scribed in my presence. The recorded information has been reviewed and is accurate.     Peggy Guise, MD 07/05/16 [phone]

## 2016-07-05 NOTE — Discharge Instructions (Signed)
Please drink plenty of fluids and get rest. Take anti-diarrheal and antinausea medications as needed.  Return for worsening symptoms, including confusion, worsening pain, intractable vomiting or any other symptoms concerning to you.

## 2016-07-07 LAB — URINE CULTURE

## 2016-07-08 ENCOUNTER — Telehealth: Payer: Self-pay

## 2016-07-08 NOTE — Telephone Encounter (Signed)
No treatment for UC from 07/05/16 [facility]

## 2016-11-15 ENCOUNTER — Emergency Department
Admission: EM | Admit: 2016-11-15 | Discharge: 2016-11-15 | Disposition: A | Discharge status: Home / Self Care | Payer: Self-pay | Attending: Student in an Organized Health Care Education/Training Program | Admitting: Student in an Organized Health Care Education/Training Program

## 2016-11-15 ENCOUNTER — Emergency Department: Payer: Self-pay

## 2016-11-15 ENCOUNTER — Encounter: Payer: Self-pay | Admitting: *Deleted

## 2016-11-15 DIAGNOSIS — R197 Diarrhea, unspecified: Secondary | ICD-10-CM

## 2016-11-15 DIAGNOSIS — R112 Nausea with vomiting, unspecified: Secondary | ICD-10-CM | POA: Insufficient documentation

## 2016-11-15 DIAGNOSIS — J45909 Unspecified asthma, uncomplicated: Secondary | ICD-10-CM | POA: Insufficient documentation

## 2016-11-15 DIAGNOSIS — R1031 Right lower quadrant pain: Secondary | ICD-10-CM | POA: Insufficient documentation

## 2016-11-15 LAB — COMPREHENSIVE METABOLIC PANEL
ALBUMIN: 4.2 g/dL (ref 3.5–5.0)
ALT: 12 U/L — ABNORMAL LOW (ref 14–54)
AST: 21 U/L (ref 15–41)
Alkaline Phosphatase: 56 U/L (ref 38–126)
Anion gap: 6 (ref 5–15)
BUN: 13 mg/dL (ref 6–20)
CHLORIDE: 106 mmol/L (ref 101–111)
CO2: 22 mmol/L (ref 22–32)
Calcium: 9.5 mg/dL (ref 8.9–10.3)
Creatinine, Ser: 0.65 mg/dL (ref 0.44–1.00)
GFR calc Af Amer: >60 mL/min (ref >60)
Glucose, Bld: 89 mg/dL (ref 65–99)
POTASSIUM: 4.2 mmol/L (ref 3.5–5.1)
SODIUM: 134 mmol/L — AB (ref 135–145)
Total Bilirubin: 0.5 mg/dL (ref 0.3–1.2)
Total Protein: 8 g/dL (ref 6.5–8.1)

## 2016-11-15 LAB — CBC
HEMATOCRIT: 41.8 % (ref 35.0–47.0)
Hemoglobin: 14.7 g/dL (ref 12.0–16.0)
MCH: 31.2 pg (ref 26.0–34.0)
MCHC: 35.1 g/dL (ref 32.0–36.0)
MCV: 88.9 fL (ref 80.0–100.0)
Platelets: 206 10*3/uL (ref 150–440)
RBC: 4.71 MIL/uL (ref 3.80–5.20)
RDW: 12.9 % (ref 11.5–14.5)
WBC: 8.2 10*3/uL (ref 3.6–11.0)

## 2016-11-15 LAB — URINALYSIS, COMPLETE (UACMP) WITH MICROSCOPIC
BILIRUBIN URINE: NEGATIVE
Glucose, UA: NEGATIVE mg/dL
Hgb urine dipstick: NEGATIVE
KETONES UR: NEGATIVE mg/dL
LEUKOCYTES UA: NEGATIVE
Nitrite: NEGATIVE
Protein, ur: NEGATIVE mg/dL
SPECIFIC GRAVITY, URINE: 1.006 (ref 1.005–1.030)
pH: 7 (ref 5.0–8.0)

## 2016-11-15 LAB — LIPASE, BLOOD: LIPASE: 31 U/L (ref 11–51)

## 2016-11-15 LAB — POCT PREGNANCY, URINE: PREG TEST UR: NEGATIVE

## 2016-11-15 MED ORDER — DICYCLOMINE HCL 10 MG PO CAPS: 10.0000 mg | ORAL_CAPSULE | Freq: Three times a day (TID) | ORAL | 0 refills | Status: DC | PRN

## 2016-11-15 MED ORDER — MORPHINE SULFATE (PF) 4 MG/ML IV SOLN
4.0000 mg | INTRAVENOUS | Status: DC | PRN
  Administered 2016-11-15: 4 mg via INTRAVENOUS
  Filled 2016-11-15: qty 1

## 2016-11-15 MED ORDER — SODIUM CHLORIDE 0.9 % IV BOLUS (SEPSIS)
1000.0000 mL | Freq: Once | INTRAVENOUS | Status: AC
  Administered 2016-11-15: 1000 mL via INTRAVENOUS

## 2016-11-15 MED ORDER — IOPAMIDOL (ISOVUE-300) INJECTION 61%
100.0000 mL | Freq: Once | INTRAVENOUS | Status: AC | PRN
  Administered 2016-11-15: 100 mL via INTRAVENOUS

## 2016-11-15 MED ORDER — PROMETHAZINE HCL 12.5 MG PO TABS: 12.5000 mg | ORAL_TABLET | Freq: Four times a day (QID) | ORAL | 0 refills | Status: DC | PRN

## 2016-11-15 MED ORDER — PROMETHAZINE HCL 25 MG/ML IJ SOLN
12.5000 mg | Freq: Once | INTRAMUSCULAR | Status: AC
  Administered 2016-11-15: 12.5 mg via INTRAVENOUS
  Filled 2016-11-15: qty 1

## 2016-11-15 NOTE — Discharge Instructions (Signed)

## 2016-11-15 NOTE — ED Notes (Signed)
Pt reports that she has N/V and diarrhea since Saturday - 9+ vomiting in 24 hours and 8+ diarrhea in 24 hours - Dr Roxan Hockeyobinson has already evaluated pt

## 2016-11-15 NOTE — ED Provider Notes (Signed)
[facility] Emergency Department Provider Note    First MD Initiated Contact with Patient 11/15/16 1132     (approximate)  I have reviewed the triage vital signs and the nursing notes.   HISTORY  Chief Complaint Abdominal Pain    HPI MYREL RAPPLEYE is a 26 y.o. female history of IBS presents with right lower quadrant pain associated with nausea vomiting and a couple episodes of diarrhea. No measured fevers. Has had decreased oral intake since Saturday night. No sick contacts. States that she did have one episode of blood in her vomit. Is not on a blood thinners. She is supposed to follow-up with gastroenterology several years ago for her IBS but never did. She's not on any sort of immunosuppression.  + history of IBD in her family.`   Past Medical History:  Diagnosis Date  . Allergy   . Anxiety   . Anxiety and depression   . Asthma   . Chest pain, unspecified   . Depression   . Dyspnea   . Gluten intolerance   . Hyperventilation   . IBS (irritable bowel syndrome)   . Malaise and fatigue   . Migraine   . Panic attack   . Panic attack   . Pregnancy induced hypertension   . SVD (spontaneous vaginal delivery) 08/31/2014   Family History  Problem Relation Age of Onset  . Hypertension Mother   . Cancer Mother   . Heart disease Maternal Grandfather    Past Surgical History:  Procedure Laterality Date  . ADENOIDECTOMY    . TONSILLECTOMY    . WISDOM TOOTH EXTRACTION     Patient Active Problem List   Diagnosis Date Noted  . SVD (spontaneous vaginal delivery) 08/31/2014  . Labor and delivery indication for care or intervention 08/30/2014  . FHR (fetal heart rate) nonreactive   . [redacted] weeks gestation of pregnancy   . Encounter for fetal anatomic survey   . [redacted] weeks gestation of pregnancy   . PANIC ATTACK 10/27/2008  . ANXIETY DEPRESSION 10/27/2008      Prior to Admission medications   Medication Sig Start Date End Date Taking? Authorizing  Provider  etonogestrel-ethinyl estradiol (NUVARING) 0.12-0.015 MG/24HR vaginal ring Place 1 each vaginally every 28 (twenty-eight) days. Insert vaginally and leave in place for 3 consecutive weeks, then remove for 1 week.   Yes [provider]  sertraline (ZOLOFT) 25 MG tablet Take 25 mg by mouth as directed.   Yes [provider]  dicyclomine (BENTYL) 10 MG capsule Take 1 capsule (10 mg total) by mouth 3 (three) times daily as needed for spasms. 11/15/16 11/29/16  Willy Eddy, MD  docusate sodium (COLACE) 100 MG capsule Take 1 capsule (100 mg total) by mouth daily as needed. Patient not taking: Reported on 06/07/2016 03/13/16 03/13/17  Myrna Blazer, MD  ibuprofen (ADVIL,MOTRIN) 800 MG tablet Take 1 tablet (800 mg total) by mouth every 8 (eight) hours as needed for mild pain or moderate pain (with food). Patient not taking: Reported on 11/15/2016 06/29/16   Rockne Menghini, MD  loperamide (IMODIUM A-D) 2 MG tablet Take 1 tablet (2 mg total) by mouth 4 (four) times daily as needed for diarrhea or loose stools. Patient not taking: Reported on 11/15/2016 06/29/16   Rockne Menghini, MD  loperamide (IMODIUM) 2 MG capsule Take 1 capsule (2 mg total) by mouth 4 (four) times daily as needed for diarrhea or loose stools. Patient not taking: Reported on 11/15/2016 07/05/16  Lavera Guise, MD  ondansetron (ZOFRAN ODT) 4 MG disintegrating tablet Take 1 tablet (4 mg total) by mouth every 8 (eight) hours as needed for nausea or vomiting. Patient not taking: Reported on 11/15/2016 06/29/16   Rockne Menghini, MD  ondansetron (ZOFRAN) 4 MG tablet Take 1 tablet (4 mg total) by mouth every 8 (eight) hours as needed for nausea or vomiting. Patient not taking: Reported on 11/15/2016 06/07/16   Alvira Monday, MD  oxyCODONE-acetaminophen (ROXICET) 5-325 MG tablet Take 1 tablet by mouth every 6 (six) hours as needed. Patient not taking: Reported on 11/15/2016 06/29/16 06/29/17  Rockne Menghini, MD  promethazine (PHENERGAN) 12.5 MG tablet Take 1 tablet (12.5 mg total) by mouth every 6 (six) hours as needed for nausea or vomiting. 11/15/16   Willy Eddy, MD  promethazine (PHENERGAN) 25 MG tablet Take 1 tablet (25 mg total) by mouth every 6 (six) hours as needed for nausea or vomiting. Patient not taking: Reported on 11/15/2016 07/05/16   Lavera Guise, MD    Allergies Hydrocodone and Sulfa antibiotics    Social History Social History  Substance Use Topics  . Smoking status: Never Smoker  . Smokeless tobacco: Never Used  . Alcohol use No    Review of Systems Patient denies headaches, rhinorrhea, blurry vision, numbness, shortness of breath, chest pain, edema, cough, abdominal pain, nausea, vomiting, diarrhea, dysuria, fevers, rashes or hallucinations unless otherwise stated above in HPI. ____________________________________________   PHYSICAL EXAM:  VITAL SIGNS: Vitals:   11/15/16 1044  BP: (!) 138/94  Pulse: 99  Resp: 16  Temp: 98.3 F (36.8 C)    Constitutional: Alert and oriented. in no acute distress. Eyes: Conjunctivae are normal.  Head: Atraumatic. Nose: No congestion/rhinnorhea. Mouth/Throat: Mucous membranes are moist.   Neck: No stridor. Painless ROM.  Cardiovascular: Normal rate, regular rhythm. Grossly normal heart sounds.  Good peripheral circulation. Respiratory: Normal respiratory effort.  No retractions. Lungs CTAB. Gastrointestinal: Soft, + RLQ ttp without guarding or rebound.. No distention. No abdominal bruits. No CVA tenderness. Musculoskeletal: No lower extremity tenderness nor edema.  No joint effusions. Neurologic:  Normal speech and language. No gross focal neurologic deficits are appreciated. No facial droop Skin:  Skin is warm, dry and intact. No rash noted. Psychiatric: Mood and affect are normal. Speech and behavior are normal.  ____________________________________________   LABS (all labs ordered are listed, but  only abnormal results are displayed)  Results for orders placed or performed during the hospital encounter of 11/15/16 (from the past 24 hour(s))  Lipase, blood     Status: None   Collection Time: 11/15/16 10:46 AM  Result Value Ref Range   Lipase 31 11 - 51 U/L  Comprehensive metabolic panel     Status: Abnormal   Collection Time: 11/15/16 10:46 AM  Result Value Ref Range   Sodium 134 (L) 135 - 145 mmol/L   Potassium 4.2 3.5 - 5.1 mmol/L   Chloride 106 101 - 111 mmol/L   CO2 22 22 - 32 mmol/L   Glucose, Bld 89 65 - 99 mg/dL   BUN 13 6 - 20 mg/dL   Creatinine, Ser 1.61 0.44 - 1.00 mg/dL   Calcium 9.5 8.9 - 09.6 mg/dL   Total Protein 8.0 6.5 - 8.1 g/dL   Albumin 4.2 3.5 - 5.0 g/dL   AST 21 15 - 41 U/L   ALT 12 (L) 14 - 54 U/L   Alkaline Phosphatase 56 38 - 126 U/L   Total Bilirubin 0.5 0.3 -  1.2 mg/dL   GFR calc non Af Amer >60 >60 mL/min   GFR calc Af Amer >60 >60 mL/min   Anion gap 6 5 - 15  CBC     Status: None   Collection Time: 11/15/16 10:46 AM  Result Value Ref Range   WBC 8.2 3.6 - 11.0 K/uL   RBC 4.71 3.80 - 5.20 MIL/uL   Hemoglobin 14.7 12.0 - 16.0 g/dL   HCT 16.1 09.6 - 04.5 %   MCV 88.9 80.0 - 100.0 fL   MCH 31.2 26.0 - 34.0 pg   MCHC 35.1 32.0 - 36.0 g/dL   RDW 40.9 81.1 - 91.4 %   Platelets 206 150 - 440 K/uL  Urinalysis, Complete w Microscopic     Status: Abnormal   Collection Time: 11/15/16 10:46 AM  Result Value Ref Range   Color, Urine STRAW (A) YELLOW   APPearance CLEAR (A) CLEAR   Specific Gravity, Urine 1.006 1.005 - 1.030   pH 7.0 5.0 - 8.0   Glucose, UA NEGATIVE NEGATIVE mg/dL   Hgb urine dipstick NEGATIVE NEGATIVE   Bilirubin Urine NEGATIVE NEGATIVE   Ketones, ur NEGATIVE NEGATIVE mg/dL   Protein, ur NEGATIVE NEGATIVE mg/dL   Nitrite NEGATIVE NEGATIVE   Leukocytes, UA NEGATIVE NEGATIVE   RBC / HPF 0-5 0 - 5 RBC/hpf   WBC, UA 0-5 0 - 5 WBC/hpf   Bacteria, UA RARE (A) NONE SEEN   Squamous Epithelial / LPF 0-5 (A) NONE SEEN   Mucous PRESENT    Pregnancy, urine POC     Status: None   Collection Time: 11/15/16 10:51 AM  Result Value Ref Range   Preg Test, Ur NEGATIVE NEGATIVE   ____________________________________________ ____________________________________________  RADIOLOGY  I personally reviewed all radiographic images ordered to evaluate for the above acute complaints and reviewed radiology reports and findings.  These findings were personally discussed with the patient.  Please see medical record for radiology report.  ____________________________________________   PROCEDURES  Procedure(s) performed:  Procedures    Critical Care performed: no ____________________________________________   INITIAL IMPRESSION / ASSESSMENT AND PLAN / ED COURSE  Pertinent labs & imaging results that were available during my care of the patient were reviewed by me and considered in my medical decision making (see chart for details).  DDX: enteritis, colitis, ibd, appendicitis, gastritis  GENIENE LIST is a 26 y.o. who presents to the ED with the above-described symptoms. She arrives afebrile hemodynamic stable. Does not appear toxic but does appear mildly uncomfortable. Blood work is fairly reassuring with borderline elevated white count. Normal renal function and no evidence of UTI. She denies any sort of vaginal discharge does suggest a component of STI. Ace on her right lower quadrant pain will order CT imaging to evaluate for any evidence of laboratory bowel disease or appendicitis given her family history.  Will provide IVF and IV antiemetics.  Clinical Course as of Nov 15 1309  Wed Nov 15, 2016  1304 Repeat abdominal exam is soft and benign. CT scan shows no evidence of acute appendicitis or IBD. There is no evidence of inflammation or cyst on her ovaries. Jamesetta So be highly unlikely to be related to ovarian torsion given her primary symptoms of nausea vomiting and diarrhea with pain ongoing for several days with no adnexal  swelling or cyst on CT scan. I discussed the utility of a transvaginal ultrasound for further evaluation with patient but she has declined this. I do feel this is a reasonable option but  I discussed need for follow-up with outpatient specialists and will be providing referrals. She is able to tolerate oral hydration and is in no acute distress.  Have discussed with the patient and available family all diagnostics and treatments performed thus far and all questions were answered to the best of my ability. The patient demonstrates understanding and agreement with plan.   [PR]    Clinical Course User Index [PR] Willy Eddyobinson, Navi Erber, MD     ____________________________________________   FINAL CLINICAL IMPRESSION(S) / ED DIAGNOSES  Final diagnoses:  Right lower quadrant abdominal pain  Nausea vomiting and diarrhea      NEW MEDICATIONS STARTED DURING THIS VISIT:  New Prescriptions   DICYCLOMINE (BENTYL) 10 MG CAPSULE    Take 1 capsule (10 mg total) by mouth 3 (three) times daily as needed for spasms.   PROMETHAZINE (PHENERGAN) 12.5 MG TABLET    Take 1 tablet (12.5 mg total) by mouth every 6 (six) hours as needed for nausea or vomiting.     Note:  This document was prepared using Dragon voice recognition software and may include unintentional dictation errors.    Willy Eddyobinson, Khilee Hendricksen, MD 11/15/16 1311

## 2016-11-15 NOTE — ED Triage Notes (Addendum)
States lower abs pain sharp in nature that began Saturday, states vomiting and diarrhea since Saturday night, pt awake and alert in no acute distress, states urinary frequency and odor to her urine, hx of IBS but has never seen GI

## 2016-12-29 ENCOUNTER — Encounter (HOSPITAL_COMMUNITY): Payer: Self-pay

## 2016-12-29 ENCOUNTER — Emergency Department (HOSPITAL_COMMUNITY)
Admission: EM | Admit: 2016-12-29 | Discharge: 2016-12-30 | Disposition: A | Discharge status: Home / Self Care | Payer: Self-pay | Attending: Emergency Medicine | Admitting: Emergency Medicine

## 2016-12-29 ENCOUNTER — Emergency Department (HOSPITAL_COMMUNITY): Payer: Self-pay

## 2016-12-29 DIAGNOSIS — N3000 Acute cystitis without hematuria: Secondary | ICD-10-CM | POA: Insufficient documentation

## 2016-12-29 DIAGNOSIS — R1011 Right upper quadrant pain: Secondary | ICD-10-CM | POA: Insufficient documentation

## 2016-12-29 DIAGNOSIS — J45909 Unspecified asthma, uncomplicated: Secondary | ICD-10-CM | POA: Insufficient documentation

## 2016-12-29 DIAGNOSIS — Z79899 Other long term (current) drug therapy: Secondary | ICD-10-CM | POA: Insufficient documentation

## 2016-12-29 LAB — COMPREHENSIVE METABOLIC PANEL
ALK PHOS: 57 U/L (ref 38–126)
ALT: 12 U/L — ABNORMAL LOW (ref 14–54)
AST: 17 U/L (ref 15–41)
Albumin: 4.2 g/dL (ref 3.5–5.0)
Anion gap: 8 (ref 5–15)
BILIRUBIN TOTAL: 0.2 mg/dL — AB (ref 0.3–1.2)
BUN: 9 mg/dL (ref 6–20)
CHLORIDE: 108 mmol/L (ref 101–111)
CO2: 24 mmol/L (ref 22–32)
CREATININE: 0.56 mg/dL (ref 0.44–1.00)
Calcium: 9.6 mg/dL (ref 8.9–10.3)
GLUCOSE: 104 mg/dL — AB (ref 65–99)
Potassium: 4.4 mmol/L (ref 3.5–5.1)
Sodium: 140 mmol/L (ref 135–145)
Total Protein: 7.6 g/dL (ref 6.5–8.1)

## 2016-12-29 LAB — URINALYSIS, ROUTINE W REFLEX MICROSCOPIC
Bilirubin Urine: NEGATIVE
Glucose, UA: NEGATIVE mg/dL
HGB URINE DIPSTICK: NEGATIVE
Ketones, ur: NEGATIVE mg/dL
Nitrite: NEGATIVE
PROTEIN: NEGATIVE mg/dL
Specific Gravity, Urine: 1.019 (ref 1.005–1.030)
pH: 5 (ref 5.0–8.0)

## 2016-12-29 LAB — CBC
HEMATOCRIT: 38 % (ref 36.0–46.0)
Hemoglobin: 13.3 g/dL (ref 12.0–15.0)
MCH: 31.4 pg (ref 26.0–34.0)
MCHC: 35 g/dL (ref 30.0–36.0)
MCV: 89.8 fL (ref 78.0–100.0)
PLATELETS: 185 10*3/uL (ref 150–400)
RBC: 4.23 MIL/uL (ref 3.87–5.11)
RDW: 12.5 % (ref 11.5–15.5)
WBC: 7.7 10*3/uL (ref 4.0–10.5)

## 2016-12-29 LAB — I-STAT BETA HCG BLOOD, ED (MC, WL, AP ONLY)

## 2016-12-29 LAB — LIPASE, BLOOD: Lipase: 27 U/L (ref 11–51)

## 2016-12-29 MED ORDER — SODIUM CHLORIDE 0.9 % IV BOLUS (SEPSIS)
1000.0000 mL | Freq: Once | INTRAVENOUS | Status: AC
  Administered 2016-12-29: 1000 mL via INTRAVENOUS

## 2016-12-29 MED ORDER — KETOROLAC TROMETHAMINE 30 MG/ML IJ SOLN
30.0000 mg | Freq: Once | INTRAMUSCULAR | Status: AC
  Administered 2016-12-29: 30 mg via INTRAVENOUS
  Filled 2016-12-29: qty 1

## 2016-12-29 MED ORDER — DICYCLOMINE HCL 10 MG/ML IM SOLN
20.0000 mg | Freq: Once | INTRAMUSCULAR | Status: AC
  Administered 2016-12-29: 20 mg via INTRAMUSCULAR
  Filled 2016-12-29: qty 2

## 2016-12-29 MED ORDER — PROMETHAZINE HCL 25 MG/ML IJ SOLN
25.0000 mg | Freq: Once | INTRAMUSCULAR | Status: AC
  Administered 2016-12-29: 25 mg via INTRAVENOUS
  Filled 2016-12-29: qty 1

## 2016-12-29 NOTE — ED Triage Notes (Signed)
Pt with abdominal pain mid abdomen radiating to rt flank.  Nausea.  IBS constipation.

## 2016-12-29 NOTE — ED Provider Notes (Signed)
[facility]-EMERGENCY DEPT Provider Note   CSN: [id number] Arrival date & time: 12/29/16  1702     History   Chief Complaint Chief Complaint  Patient presents with  . Abdominal Pain    HPI LOUISE RAWSON is a 26 y.o. female with a h/o of IBS who presents to the emergency department with a chief complaint of epigastric and RUQ abdominal pain with associated back pain, dysuria, hesitancy, and frequency. No fever or chills. She reports she has a history of constipation secondary to IBS and uses laxatives on an almost daily basis. She reports most recently she used mag citrate for the last 2 days. She reports a couple of episodes of loose stools this morning, but she is concerned that she is still constipated. No other treatment prior to arrival.  She reports she has been evaluated for similar symptoms in the emergency department on several occasions. She states that she currently does not have insurance and is unable to follow up with GI as she has previously been referred. No history of abdominal surgery.  The history is provided by the patient. No language interpreter was used.    Past Medical History:  Diagnosis Date  . Allergy   . Anxiety   . Anxiety and depression   . Asthma   . Chest pain, unspecified   . Depression   . Dyspnea   . Gluten intolerance   . Hyperventilation   . IBS (irritable bowel syndrome)   . Malaise and fatigue   . Migraine   . Panic attack   . Panic attack   . Pregnancy induced hypertension   . SVD (spontaneous vaginal delivery) 08/31/2014    Patient Active Problem List   Diagnosis Date Noted  . SVD (spontaneous vaginal delivery) 08/31/2014  . Labor and delivery indication for care or intervention 08/30/2014  . FHR (fetal heart rate) nonreactive   . [redacted] weeks gestation of pregnancy   . Encounter for fetal anatomic survey   . [redacted] weeks gestation of pregnancy   . PANIC ATTACK 10/27/2008  . ANXIETY DEPRESSION 10/27/2008    Past Surgical History:    Procedure Laterality Date  . ADENOIDECTOMY    . TONSILLECTOMY    . WISDOM TOOTH EXTRACTION      OB History    Gravida Para Term Preterm AB Living   1 1 1     1    SAB TAB Ectopic Multiple Live Births         0 1       Home Medications    Prior to Admission medications   Medication Sig Start Date End Date Taking? Authorizing Provider  docusate sodium (COLACE) 100 MG capsule Take 1 capsule (100 mg total) by mouth daily as needed. Patient taking differently: Take 100 mg by mouth daily as needed for mild constipation or moderate constipation.  03/13/16 03/13/17 Yes Myrna Blazer, MD  etonogestrel-ethinyl estradiol (NUVARING) 0.12-0.015 MG/24HR vaginal ring Place 1 each vaginally every 28 (twenty-eight) days. Insert vaginally and leave in place for 3 consecutive weeks, then remove for 1 week.   Yes [provider]  magnesium citrate SOLN Take 0.5 Bottles by mouth once.   Yes [provider]  Probiotic Product (PROBIOTIC ADVANCED PO) Take 1 capsule by mouth daily.   Yes [provider]  ciprofloxacin (CIPRO) 500 MG tablet Take 1 tablet (500 mg total) by mouth 2 (two) times daily. 12/30/16   Ameila Weldon A, PA-C  dicyclomine (BENTYL) 20 MG tablet Take  1 tablet (20 mg total) by mouth 2 (two) times daily. 12/30/16   Erica Osuna A, PA-C  ondansetron (ZOFRAN) 4 MG tablet Take 1 tablet (4 mg total) by mouth every 6 (six) hours. 12/30/16   Kasumi Ditullio A, PA-C    Family History Family History  Problem Relation Age of Onset  . Hypertension Mother   . Cancer Mother   . Heart disease Maternal Grandfather     Social History Social History  Substance Use Topics  . Smoking status: Never Smoker  . Smokeless tobacco: Never Used  . Alcohol use No     Allergies   Hydrocodone and Sulfa antibiotics   Review of Systems Review of Systems  Constitutional: Negative for activity change, chills and fever.  Respiratory: Negative for shortness of breath.    Cardiovascular: Negative for chest pain.  Gastrointestinal: Positive for constipation, diarrhea, nausea and vomiting. Negative for abdominal pain, anal bleeding and blood in stool.  Genitourinary: Positive for dysuria, frequency and urgency. Negative for dyspareunia, hematuria, vaginal bleeding, vaginal discharge and vaginal pain.  Musculoskeletal: Positive for back pain.  Skin: Negative for rash.   Physical Exam Updated Vital Signs BP 123/83   Pulse 93   Temp 98.3 F (36.8 C) (Oral)   Resp 16   LMP 12/17/2016 (Approximate)   SpO2 99%   Physical Exam  Constitutional: She appears well-developed and well-nourished. No distress.  HENT:  Head: Normocephalic.  Eyes: Conjunctivae are normal.  Neck: Neck supple.  Cardiovascular: Normal rate and regular rhythm.  Exam reveals no gallop and no friction rub.   No murmur heard. Pulmonary/Chest: Effort normal. No respiratory distress.  Abdominal: Soft. She exhibits no distension.  Moderate tenderness to palpation over the right upper quadrant and epigastric area. No left upper or lower quadrant abdominal tenderness to palpation; however, the patient is moderately tender to palpation over the suprapubic area. Right CVA tenderness. No left CVA tenderness.  Musculoskeletal: Normal range of motion. She exhibits no tenderness.  Neurological: She is alert.  Skin: Skin is warm. Capillary refill takes less than 2 seconds. No rash noted. She is not diaphoretic.  Psychiatric: Her behavior is normal.  Nursing note and vitals reviewed.  ED Treatments / Results  Labs (all labs ordered are listed, but only abnormal results are displayed) Labs Reviewed  COMPREHENSIVE METABOLIC PANEL - Abnormal; Notable for the following:       Result Value   Glucose, Bld 104 (*)    ALT 12 (*)    Total Bilirubin 0.2 (*)    All other components within normal limits  URINALYSIS, ROUTINE W REFLEX MICROSCOPIC - Abnormal; Notable for the following:    APPearance HAZY (*)     Leukocytes, UA TRACE (*)    Bacteria, UA MANY (*)    Squamous Epithelial / LPF 0-5 (*)    All other components within normal limits  URINE CULTURE  LIPASE, BLOOD  CBC  I-STAT BETA HCG BLOOD, ED ([facility], [facility], [facility] ONLY)    EKG  EKG Interpretation None       Radiology Dg Abdomen 1 View  Result Date: 12/29/2016 CLINICAL DATA:  Acute onset of right upper quadrant and epigastric abdominal pain. Right flank pain. Nausea, vomiting and constipation. Initial encounter. EXAM: ABDOMEN - 1 VIEW COMPARISON:  CT of the abdomen and pelvis from 11/15/2016 FINDINGS: The visualized bowel gas pattern is unremarkable. Scattered air and stool filled loops of colon are seen; no abnormal dilatation of small bowel loops is seen to suggest  small bowel obstruction. No free intra-abdominal air is identified, though evaluation for free air is limited on a single supine view. The visualized osseous structures are within normal limits; the sacroiliac joints are unremarkable in appearance. IMPRESSION: Unremarkable bowel gas pattern; no free intra-abdominal air seen. Small to moderate amount of stool noted in the colon. Electronically Signed   By: Roanna RaiderJeffery  Chang M.D.   On: 12/29/2016 23:55    Procedures Procedures (including critical care time)  Medications Ordered in ED Medications  ciprofloxacin (CIPRO) tablet 500 mg (500 mg Oral Given 12/30/16 0110)  ketorolac (TORADOL) 30 MG/ML injection 30 mg (30 mg Intravenous Given 12/29/16 2300)  sodium chloride 0.9 % bolus 1,000 mL (0 mLs Intravenous Stopped 12/30/16 0051)  promethazine (PHENERGAN) injection 25 mg (25 mg Intravenous Given 12/29/16 2319)  dicyclomine (BENTYL) injection 20 mg (20 mg Intramuscular Given 12/29/16 2301)     Initial Impression / Assessment and Plan / ED Course  I have reviewed the triage vital signs and the nursing notes.  Pertinent labs & imaging results that were available during my care of the patient were reviewed by me and considered in my  medical decision making (see chart for details).     26 year old female with a history of IBS with constipation and chronic laxative use. KUB demonstrating small to moderate amount of stool. No free air. Bentyl and IV fluids given in the ED. The patient is complaining of urinary symptoms and UA is concerning for urinary tract infection vs pyelonephritis. The patient is afebrile and vital signs are stable. Recent CT performed in the emergency department demonstrates a 1 cm intermediate density lesion at the lower pole of the left kidney is indeterminate. An MRI with renal mass protocol is recommended on an outpatient basis. The patient reports that she does not have outpatient follow-up as she does not currently have adequate insurance. At this time, I will treat the patient with ciprofloxacin for a urinary tract infection to cover for mild pyelonephritis and provide the patient with [facility]. Strict return precautions given. No acute distress. The patient is safe for discharge at this time.  Final Clinical Impressions(s) / ED Diagnoses   Final diagnoses:  Acute cystitis without hematuria    New Prescriptions Discharge Medication List as of 12/30/2016 12:56 AM    START taking these medications   Details  ciprofloxacin (CIPRO) 500 MG tablet Take 1 tablet (500 mg total) by mouth 2 (two) times daily., Starting Sat 12/30/2016, Print    dicyclomine (BENTYL) 20 MG tablet Take 1 tablet (20 mg total) by mouth 2 (two) times daily., Starting Sat 12/30/2016, Print         Renesme Kerrigan A, PA-C 12/30/16 Neena [id number]    Yelverton, David, MD 12/31/16 Barry [id number]

## 2016-12-30 MED ORDER — ONDANSETRON HCL 4 MG PO TABS: 4.0000 mg | ORAL_TABLET | Freq: Four times a day (QID) | ORAL | 0 refills | Status: DC

## 2016-12-30 MED ORDER — CIPROFLOXACIN HCL 500 MG PO TABS
500.0000 mg | ORAL_TABLET | Freq: Two times a day (BID) | ORAL | Status: DC
  Administered 2016-12-30: 500 mg via ORAL
  Filled 2016-12-30: qty 1

## 2016-12-30 MED ORDER — DICYCLOMINE HCL 20 MG PO TABS: 20.0000 mg | ORAL_TABLET | Freq: Two times a day (BID) | ORAL | 0 refills | Status: DC

## 2016-12-30 MED ORDER — CIPROFLOXACIN HCL 500 MG PO TABS: 500.0000 mg | ORAL_TABLET | Freq: Two times a day (BID) | ORAL | 0 refills | Status: DC

## 2016-12-30 NOTE — Discharge Instructions (Signed)
Please take ciprofloxacin two times daily for the next 7 days. You may take Bentyl up to two times daily for IBS symptoms. You may take Zofran every 6 hours as needed for nausea.   Please call [facility] and wellness to get established with a primary care provider. Their are information is provided above.  Please try to discontinue your laxative use. When used for a long time, laxatives can make your symptoms worse. If you develop new or worsening symptoms, including, fever, chills, worsening pelvic or back pain, please return to the Emergency Department for re-evaluation.

## 2016-12-31 LAB — URINE CULTURE

## 2017-04-18 ENCOUNTER — Encounter: Payer: Self-pay | Admitting: Emergency Medicine

## 2017-04-18 ENCOUNTER — Other Ambulatory Visit: Payer: Self-pay

## 2017-04-18 DIAGNOSIS — J45909 Unspecified asthma, uncomplicated: Secondary | ICD-10-CM | POA: Insufficient documentation

## 2017-04-18 DIAGNOSIS — J0100 Acute maxillary sinusitis, unspecified: Secondary | ICD-10-CM | POA: Insufficient documentation

## 2017-04-18 DIAGNOSIS — Z79899 Other long term (current) drug therapy: Secondary | ICD-10-CM | POA: Insufficient documentation

## 2017-04-18 NOTE — ED Triage Notes (Addendum)
Patient ambulatory to triage with steady gait, without difficulty or distress noted; pt reports chills, congestion, sore throat, body aches since Sunday; mask placed on pt

## 2017-04-19 ENCOUNTER — Emergency Department
Admission: EM | Admit: 2017-04-19 | Discharge: 2017-04-19 | Disposition: A | Discharge status: Home / Self Care | Payer: Self-pay | Attending: Emergency Medicine | Admitting: Emergency Medicine

## 2017-04-19 DIAGNOSIS — J0100 Acute maxillary sinusitis, unspecified: Secondary | ICD-10-CM

## 2017-04-19 LAB — POCT RAPID STREP A: Streptococcus, Group A Screen (Direct): NEGATIVE

## 2017-04-19 LAB — INFLUENZA PANEL BY PCR (TYPE A & B)
INFLAPCR: NEGATIVE
Influenza B By PCR: NEGATIVE

## 2017-04-19 MED ORDER — AZITHROMYCIN 500 MG PO TABS: 500.0000 mg | ORAL_TABLET | Freq: Every day | ORAL | 0 refills | Status: AC

## 2017-04-19 MED ORDER — AZITHROMYCIN 500 MG PO TABS
500.0000 mg | ORAL_TABLET | Freq: Once | ORAL | Status: AC
  Administered 2017-04-19: 500 mg via ORAL
  Filled 2017-04-19: qty 1

## 2017-04-19 NOTE — ED Provider Notes (Signed)
[facility] Emergency Department Provider Note   Time seen:  I have reviewed the triage vital signs and the nursing notes.   HISTORY  Chief Complaint Nasal Congestion; Chills; and Sore Throat    HPI [name] is a 26 y.o. female with below list of chronic medical conditions presents to the emergency department with nasal congestion chills sore throat and generalized body aches since Sunday.  Patient states that she is tried multiple remedies at home including DayQuil, NyQuil etc. without any improvement.  Patient admits to previous history of sinusitis in the past states that this is consistent with such.   Past Medical History:  Diagnosis Date  . Allergy   . Anxiety   . Anxiety and depression   . Asthma   . Chest pain, unspecified   . Depression   . Dyspnea   . Gluten intolerance   . Hyperventilation   . IBS (irritable bowel syndrome)   . Malaise and fatigue   . Migraine   . Panic attack   . Panic attack   . Pregnancy induced hypertension   . SVD (spontaneous vaginal delivery) 08/31/2014    Patient Active Problem List   Diagnosis Date Noted  . SVD (spontaneous vaginal delivery) 08/31/2014  . Labor and delivery indication for care or intervention 08/30/2014  . FHR (fetal heart rate) nonreactive   . [redacted] weeks gestation of pregnancy   . Encounter for fetal anatomic survey   . [redacted] weeks gestation of pregnancy   . PANIC ATTACK 10/27/2008  . ANXIETY DEPRESSION 10/27/2008    Past Surgical History:  Procedure Laterality Date  . ADENOIDECTOMY    . TONSILLECTOMY    . WISDOM TOOTH EXTRACTION      Prior to Admission medications   Medication Sig Start Date End Date Taking? Authorizing Provider  azithromycin (ZITHROMAX) 500 MG tablet Take 1 tablet (500 mg total) by mouth daily for 3 days. Take 1 tablet daily for 3 days. 04/19/17 04/22/17  Darci CurrentBrown, [place] N, MD  ciprofloxacin (CIPRO) 500 MG tablet Take 1 tablet (500 mg total) by mouth 2 (two)  times daily. 12/30/16   McDonald, Mia A, PA-C  dicyclomine (BENTYL) 20 MG tablet Take 1 tablet (20 mg total) by mouth 2 (two) times daily. 12/30/16   McDonald, Mia A, PA-C  etonogestrel-ethinyl estradiol (NUVARING) 0.12-0.015 MG/24HR vaginal ring Place 1 each vaginally every 28 (twenty-eight) days. Insert vaginally and leave in place for 3 consecutive weeks, then remove for 1 week.    [provider]  magnesium citrate SOLN Take 0.5 Bottles by mouth once.    [provider]  ondansetron (ZOFRAN) 4 MG tablet Take 1 tablet (4 mg total) by mouth every 6 (six) hours. 12/30/16   McDonald, Mia A, PA-C  Probiotic Product (PROBIOTIC ADVANCED PO) Take 1 capsule by mouth daily.    [provider]    Allergies Hydrocodone and Sulfa antibiotics  Family History  Problem Relation Age of Onset  . Hypertension Mother   . Cancer Mother   . Heart disease Maternal Grandfather     Social History Social History   Tobacco Use  . Smoking status: Never Smoker  . Smokeless tobacco: Never Used  Substance Use Topics  . Alcohol use: No  . Drug use: No    Review of Systems Constitutional: Positive for chills Eyes: No visual changes. ENT:  Positive for nasal congestion and sore throat Cardiovascular: Denies chest pain. Respiratory: Denies shortness of breath. Gastrointestinal: No abdominal  pain.  No nausea, no vomiting.  No diarrhea.  No constipation. Genitourinary: Negative for dysuria. Musculoskeletal: Negative for neck pain.  Negative for back pain. Integumentary: Negative for rash. Neurological: Negative for headaches, focal weakness or numbness.   ____________________________________________   PHYSICAL EXAM:  VITAL SIGNS: ED Triage Vitals  Enc Vitals Group     BP 04/18/17 2346 (!) 145/84     Pulse Rate 04/18/17 2346 (!) 110     Resp 04/18/17 2346 18     Temp 04/18/17 2346 98 F (36.7 C)     Temp Source 04/18/17 2346 Oral     SpO2 04/18/17 2346 99 %     Weight  04/18/17 2331 72.6 kg (160 lb)     Height 04/18/17 2331 1.626 m (5\' 4" )     Head Circumference --      Peak Flow --      Pain Score 04/18/17 2330 9     Pain Loc --      Pain Edu? --      Excl. in GC? --     Constitutional: Alert and oriented. Well appearing and in no acute distress. Eyes: Conjunctivae are normal.  Head: Atraumatic. Nose: Positive for congestion/rhinnorhea. Mouth/Throat: Mucous membranes are moist.  Oropharynx non-erythematous. Neck: No stridor. Cardiovascular: Normal rate, regular rhythm. Good peripheral circulation. Grossly normal heart sounds. Respiratory: Normal respiratory effort.  No retractions. Lungs CTAB. Gastrointestinal: Soft and nontender. No distention.  Musculoskeletal: No lower extremity tenderness nor edema. No gross deformities of extremities. Neurologic:  Normal speech and language. No gross focal neurologic deficits are appreciated.  Skin:  Skin is warm, dry and intact. No rash noted. Psychiatric: Mood and affect are normal. Speech and behavior are normal.  ____________________________________________   LABS (all labs ordered are listed, but only abnormal results are displayed)  Labs Reviewed  INFLUENZA PANEL BY PCR (TYPE A & B)     Procedures   ____________________________________________   INITIAL IMPRESSION / ASSESSMENT AND PLAN / ED COURSE  As part of my medical decision making, I reviewed the following data within the electronic MEDICAL RECORD 89 year old female present with above-stated history and physical exam consistent with acute sinusitis.  Patient given azithromycin in the emergency department will be prescribed the same for home. ____________________________________________  FINAL CLINICAL IMPRESSION(S) / ED DIAGNOSES  Final diagnoses:  Acute non-recurrent maxillary sinusitis     MEDICATIONS GIVEN DURING THIS VISIT:  Medications  azithromycin (ZITHROMAX) tablet 500 mg (500 mg Oral Given 04/19/17 0231)     ED  Discharge Orders        Ordered    azithromycin (ZITHROMAX) 500 MG tablet  Daily     04/19/17 0248       Note:  This document was prepared using Dragon voice recognition software and may include unintentional dictation errors.    Darci CurrentBrown, [place] N, MD 04/20/17 [phone]

## 2017-04-19 NOTE — ED Notes (Signed)
Pt has been getting progressively worse with flu like symptoms over the week. EDP at bedside

## 2017-08-13 IMAGING — CT CT ABD-PELV W/ CM
2 of 4 series · 11 of 46 positions shown, 12 images · IV contrast (Iodine)
Comparison: CT Abdomen and Pelvis 11/14/2013 and earlier.

CLINICAL DATA: 24-year-old female with right side pain radiating to
the back. Nausea vomiting headache and fever. Initial encounter.

EXAM:
CT ABDOMEN AND PELVIS WITH CONTRAST
TECHNIQUE: Multidetector CT imaging of the abdomen and pelvis was performed
using the standard protocol following bolus administration of
intravenous contrast.
CONTRAST:  100mL [id number] IOPAMIDOL (J2YRWU-EGG) INJECTION 61%

[Series 201: routine, idose (2) · axial · 0.78mm/px · z∈[-620,-250]mm · 8 of 88 slices shown, 9 images]
[im 7/88  soft-tissue]
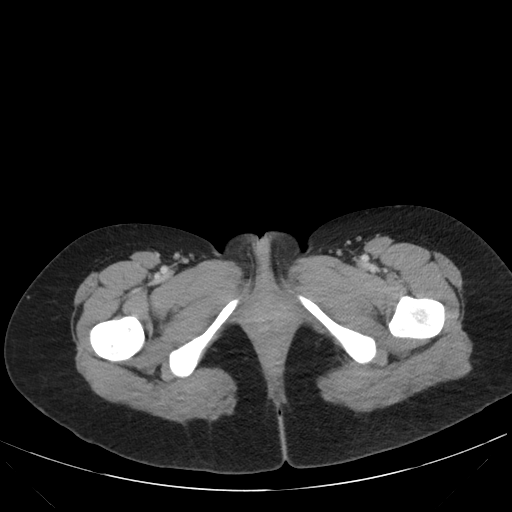
[im 7/88  bone]
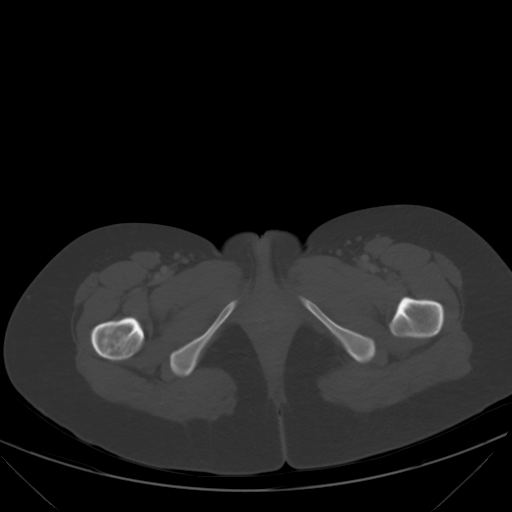
[im 18/88  soft-tissue]
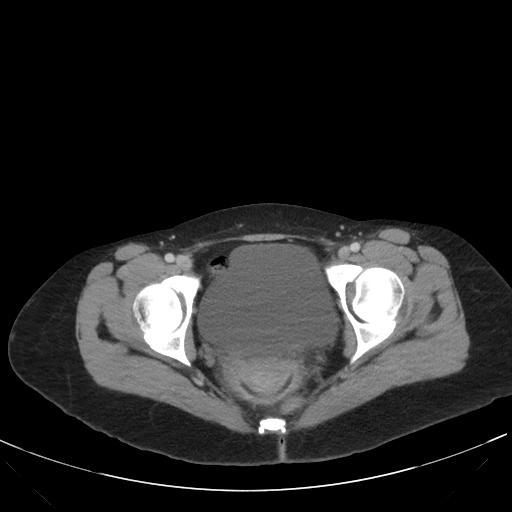
[im 28/88  soft-tissue]
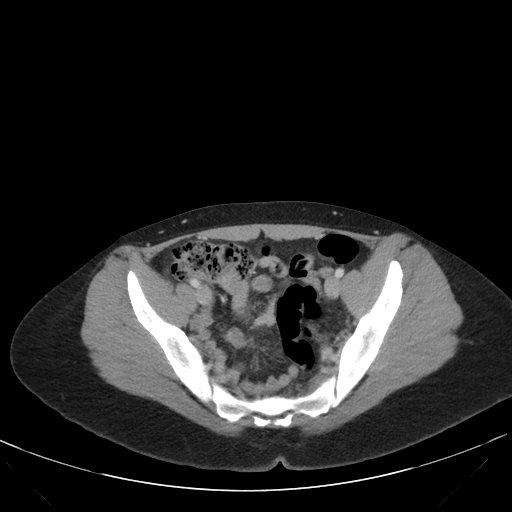
[im 39/88  soft-tissue]
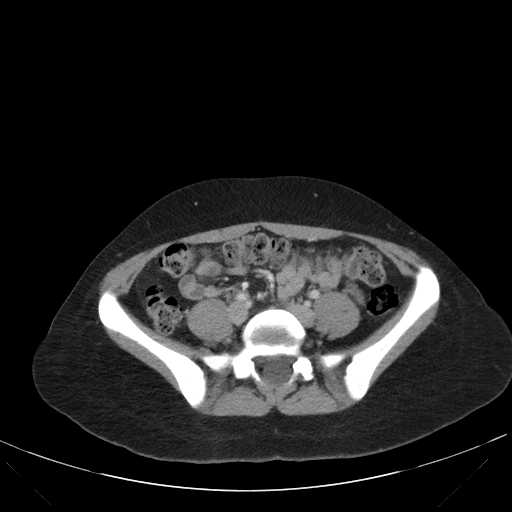
[im 49/88  soft-tissue]
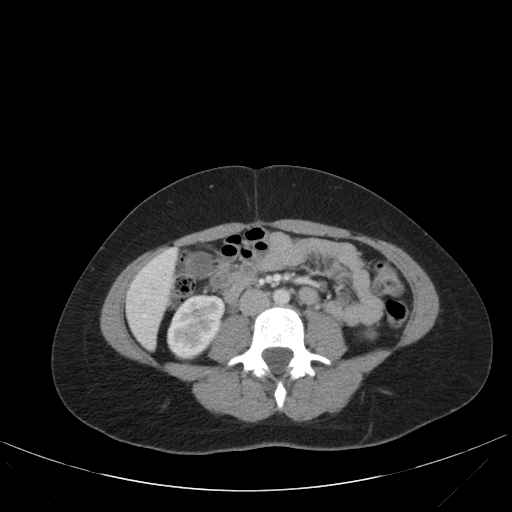
[im 60/88  soft-tissue]
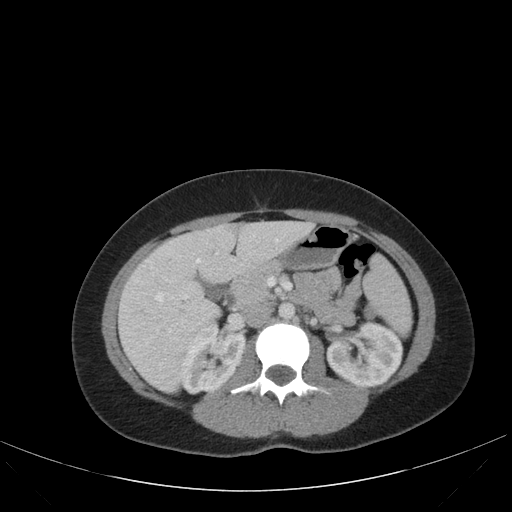
[im 70/88  soft-tissue]
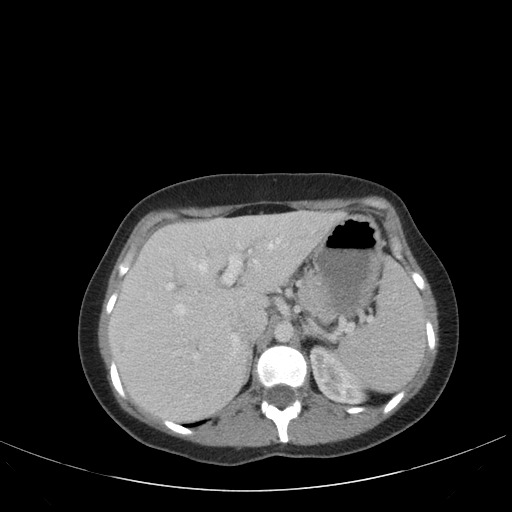
[im 81/88  soft-tissue]
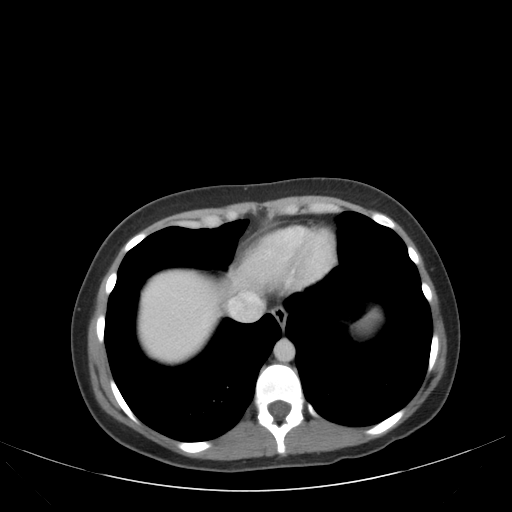

[Series 203: coronals, idose (2) · coronal · 0.45mm/px · 3 of 117 slices shown]
[im 39/117  soft-tissue]
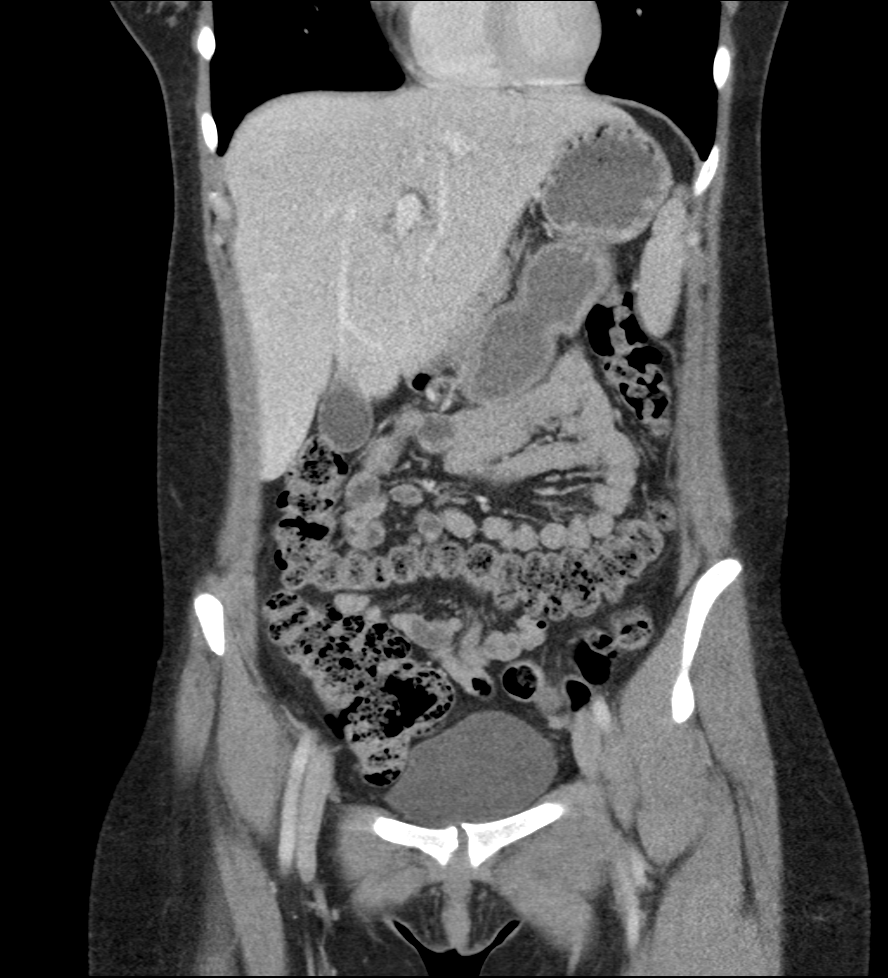
[im 52/117  soft-tissue]
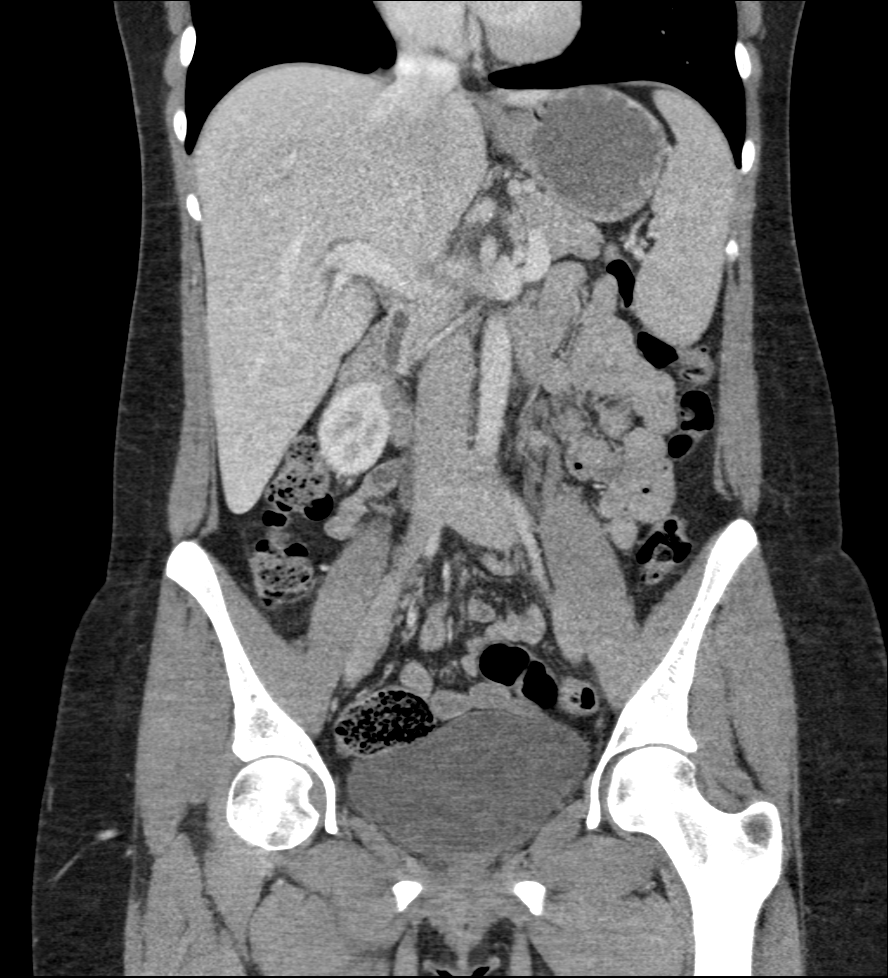
[im 65/117  soft-tissue]
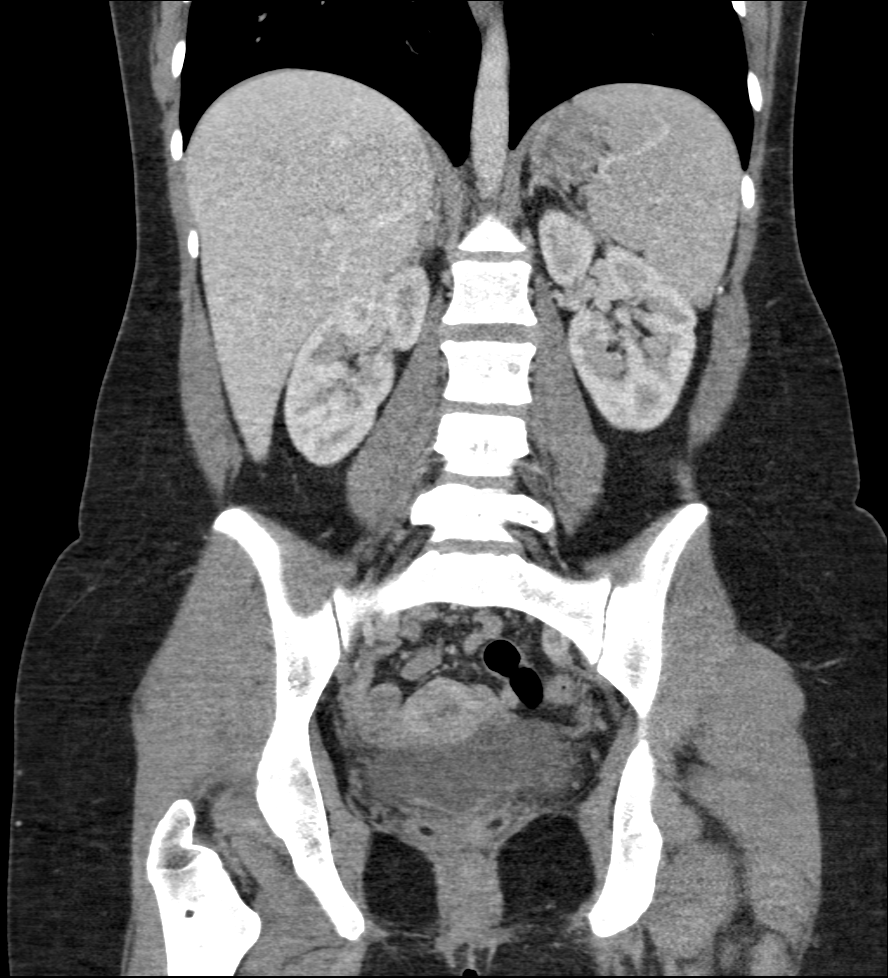

[11 of 46 positions shown; findings below may reference images not displayed]

FINDINGS: Stable mild scarring or atelectasis in the costophrenic angles, more
so the right. Otherwise negative lung bases. No pericardial or
pleural effusion.

Stable visualized osseous structures. Incidental lower lumbar and
upper sacral spina bifida occulta (normal variant). Chronic
sclerosis of the proximal right femoral shaft is chronic. A
pathologic fracture was described here in in 01/19/1999 hip
radiograph report, where benign unicameral bone cyst was favored (no
images available).

Trace free fluid in the right hemipelvis, decreased compared to the
prior study. Negative uterus and adnexa. Pessary in place.
Decompressed rectum. Unremarkable urinary bladder.

Redundant but otherwise negative sigmoid colon. Negative left colon.
Negative transverse colon aside from mild redundancy and retained
stool. Negative right colon. The cecum is mostly located in the
right hemipelvis. A normal appendix is suggested just posterior to
the cecum on series 21, image 63. Negative terminal ileum.
Decompressed small bowel loops in the pelvis. No dilated small bowel
elsewhere. Negative stomach and duodenum.

No abdominal free air or free fluid. Mild prominence of the central
intrahepatic biliary tree is stable since 6832 (series 21, image
20). Liver enhancement otherwise is normal. The gallbladder appears
normal. No CBD enlargement. Negative spleen, pancreas and adrenal
glands. Portal venous system is patent. Major arterial structures
are normal. No lymphadenopathy. Both kidneys appear stable and
normal.
IMPRESSION: Stable and negative CT appearance of the abdomen and pelvis.

## 2018-02-09 IMAGING — CT CT ABD-PELV W/ CM
2 of 4 series · 15 of 46 positions shown, 17 images · IV contrast (iopamidol)
Comparison: March 13, 2016

CLINICAL DATA: Abdominal pain with nausea and vomiting

EXAM:
CT ABDOMEN AND PELVIS WITH CONTRAST
TECHNIQUE: Multidetector CT imaging of the abdomen and pelvis was performed
using the standard protocol following bolus administration of
intravenous contrast. Oral contrast was also administered.
CONTRAST:  100mL [id number] IOPAMIDOL ([id number]) INJECTION 61%

[Series 2: routine abd/pel with · axial · 0.74mm/px · z∈[-615,-230]mm · 12 of 89 slices shown, 14 images]
[im 8/89  soft-tissue]
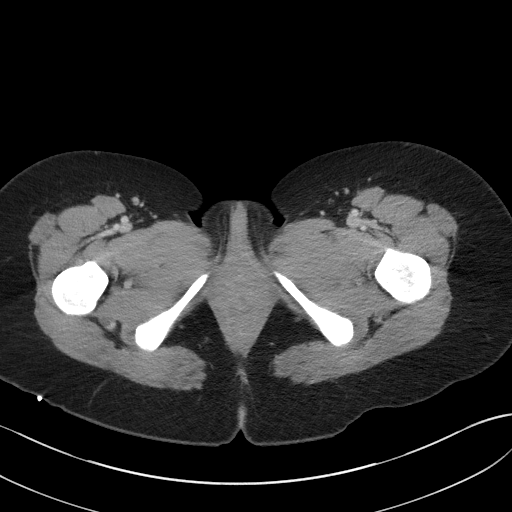
[im 8/89  bone]
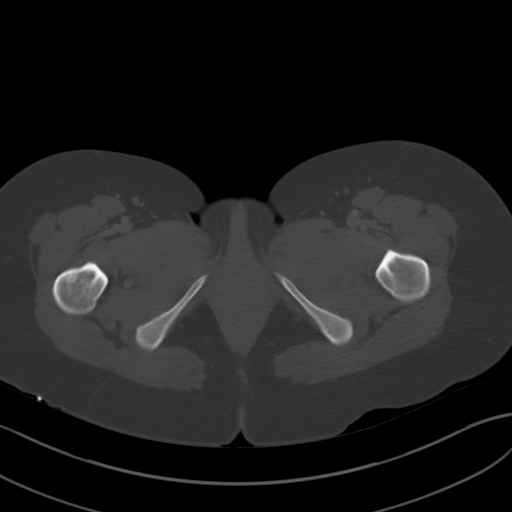
[im 15/89  soft-tissue]
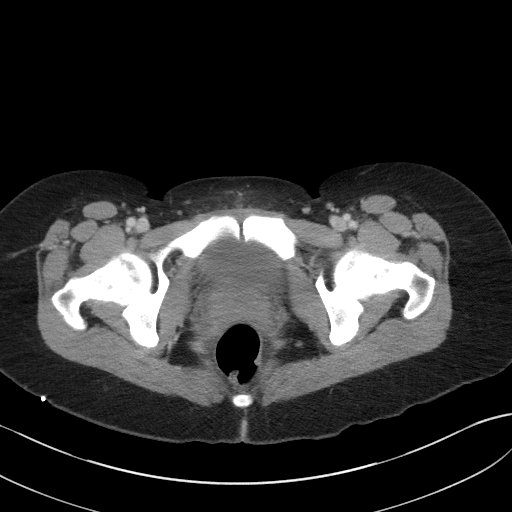
[im 22/89  soft-tissue]
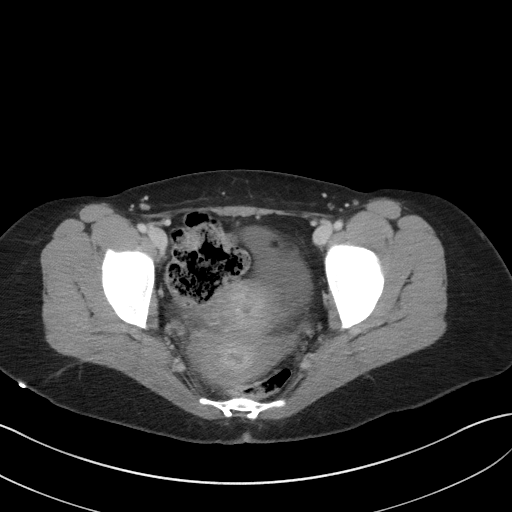
[im 29/89  soft-tissue]
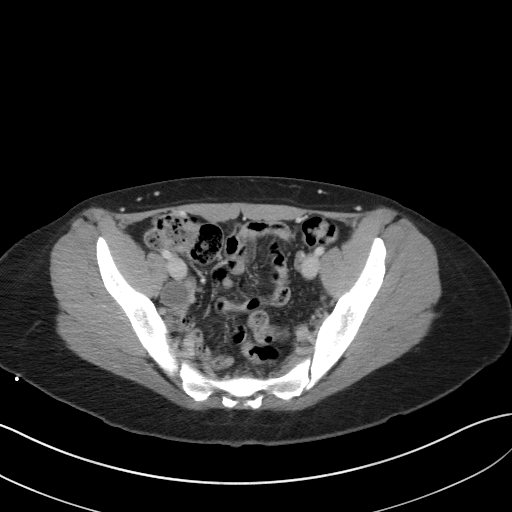
[im 36/89  soft-tissue]
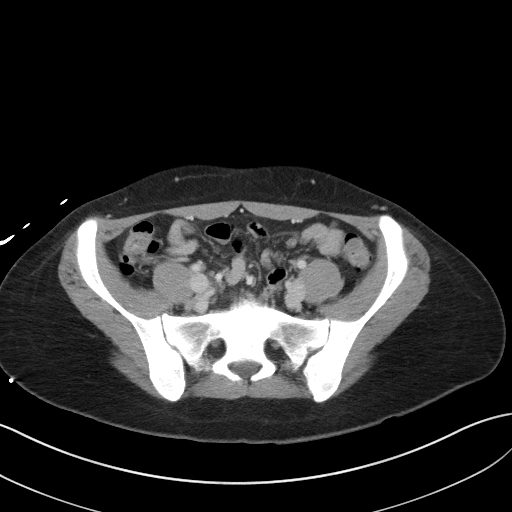
[im 43/89  soft-tissue]
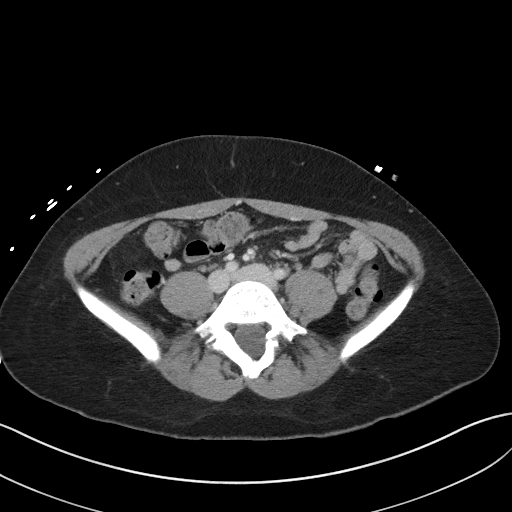
[im 50/89  soft-tissue]
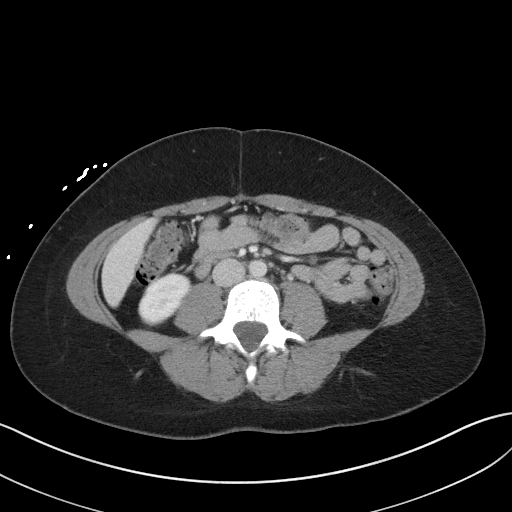
[im 57/89  soft-tissue]
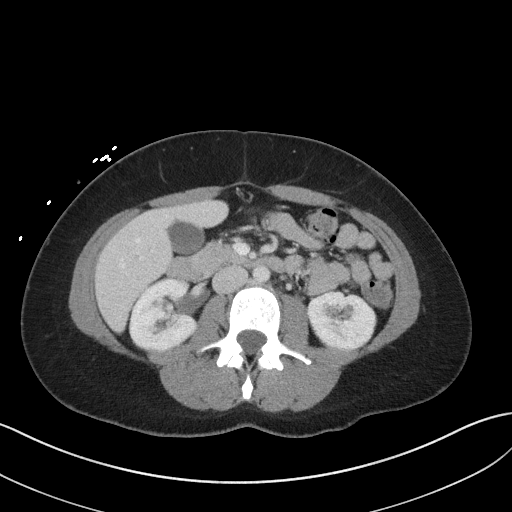
[im 64/89  soft-tissue]
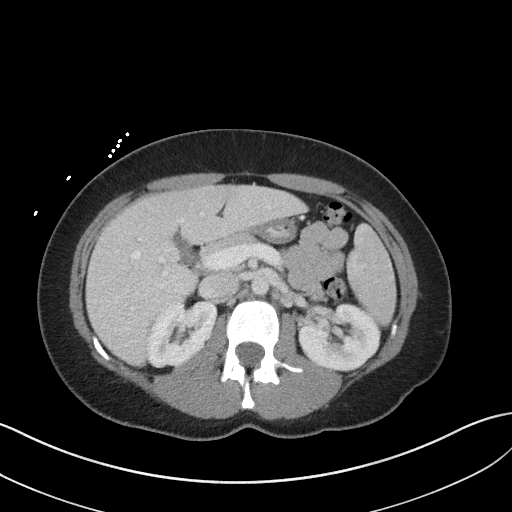
[im 64/89  bone]
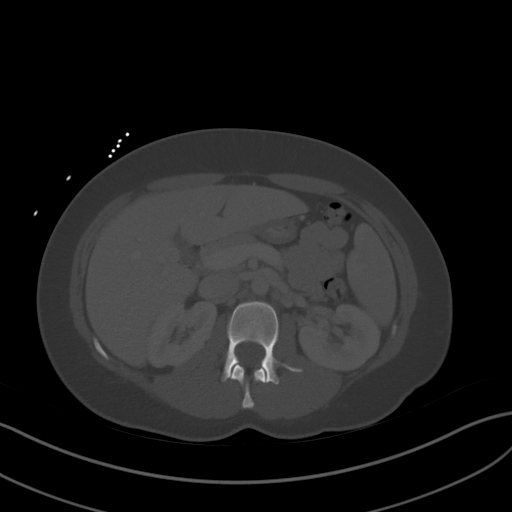
[im 71/89  soft-tissue]
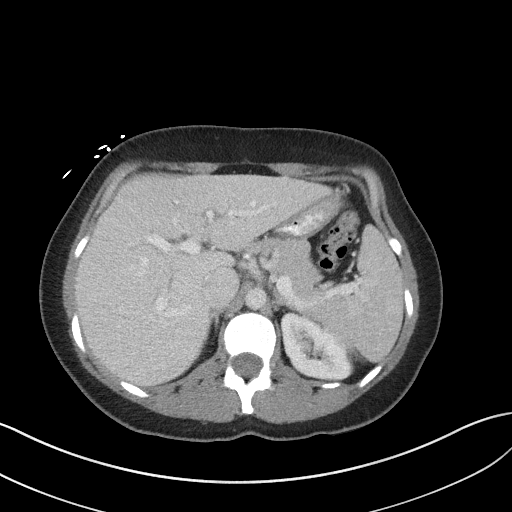
[im 78/89  soft-tissue]
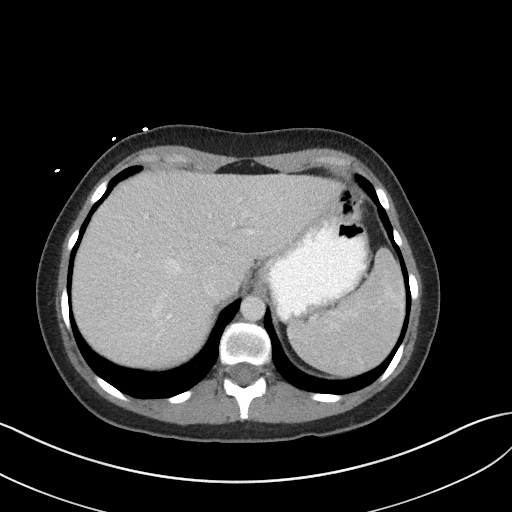
[im 85/89  soft-tissue]
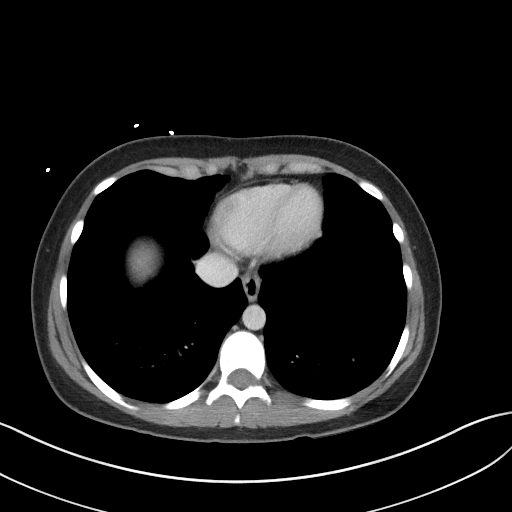

[Series 5: coronal st · coronal · 0.77mm/px · 3 of 73 slices shown]
[im 25/73  soft-tissue]
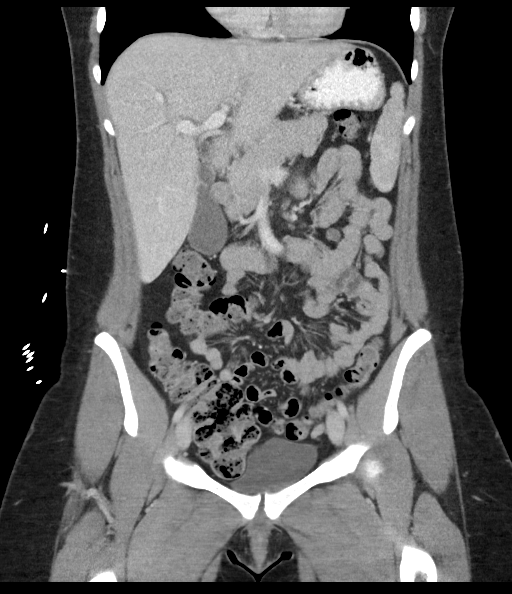
[im 33/73  soft-tissue]
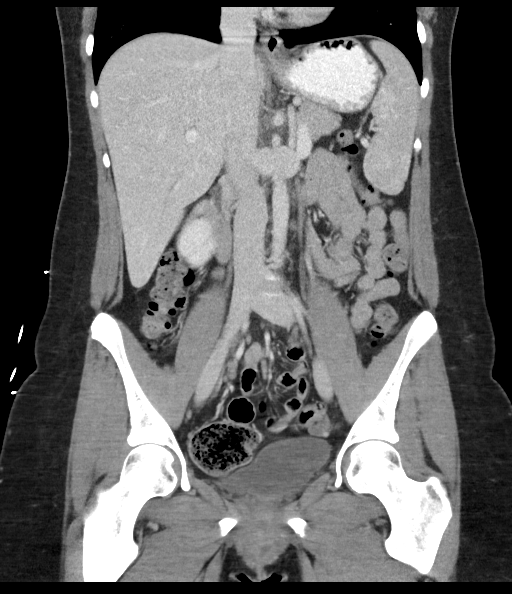
[im 41/73  soft-tissue]
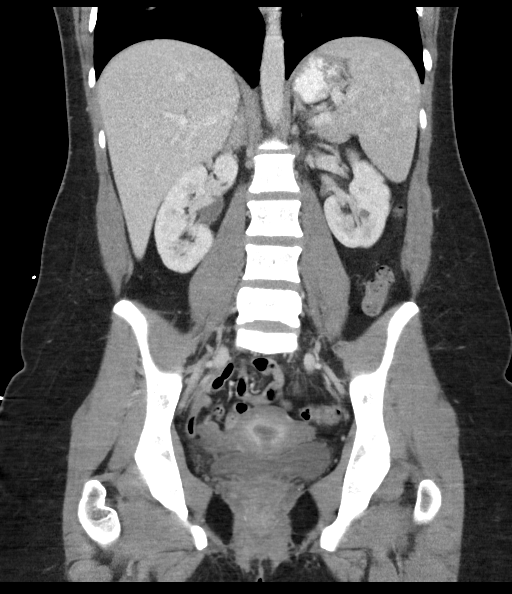

[15 of 46 positions shown; findings below may reference images not displayed]

FINDINGS: Lower chest: Lung bases are clear.

Hepatobiliary: Liver measures 19.2 cm in length. No focal liver
lesions are appreciable. Gallbladder wall is not thickened. There is
no biliary duct dilatation.

Pancreas: No pancreatic mass or inflammatory focus.

Spleen: No splenic lesions are appreciable.

Adrenals/Urinary Tract: Adrenals appear normal bilaterally. There is
a mass arising in the lower pole of the left kidney measuring 1.3 x
1.2 cm which has attenuation values higher than is expected with a
simple cyst. No other evident renal mass. There is no hydronephrosis
on either side. There is no renal or ureteral calculus on either
side. Urinary bladder is midline with wall thickness within normal
limits.

Stomach/Bowel: There is no appreciable bowel wall or mesenteric
thickening. No bowel obstruction. No free air or portal venous air.

Vascular/Lymphatic: There is no abdominal aortic aneurysm. No
vascular lesions are appreciable. There is no adenopathy in the
abdomen or pelvis.

Reproductive: Uterus is anteverted. Endometrium appears somewhat
thickened. There is a dominant follicle in the right ovary measuring
2 x 2 cm. No other pelvic mass evident. A small amount of fluid is
noted in the rightward aspect of the cul-de-sac.

Other: Appendix appears normal. No abscess in the abdomen or pelvis.
There is a minimal ventral hernia containing only fat.

Musculoskeletal: Previous repair of a unicameral bone cyst noted in
the right proximal femoral diaphysis. No lytic or destructive bone
lesions. No intramuscular or abdominal wall lesions evident.
IMPRESSION: There is a 1.3 x 1.2 cm mass in the lower pole left kidney which has
attenuation values higher than is expected with a cyst. Further
evaluation with pre and post contrast MRI should be considered
nonemergently. Pre and post contrast CT could alternatively be
performed, but would likely be of decreased accuracy given lesion
size.

Prominent liver without focal lesion.

Somewhat thickened endometrium. Significance of this finding is
uncertain. It may warrant pelvic ultrasound to further evaluation.
Small amount of free fluid in cul-de-sac may be indicative of recent
ovarian cyst leakage or rupture.

Previous repair of unicameral bone cyst in proximal right femur,
incompletely visualized currently.

No bowel obstruction. No abscess. Appendix appears normal. No renal
or ureteral calculus. No hydronephrosis.

## 2018-04-03 ENCOUNTER — Encounter (HOSPITAL_COMMUNITY): Payer: Self-pay | Admitting: Emergency Medicine

## 2018-04-03 ENCOUNTER — Other Ambulatory Visit: Payer: Self-pay

## 2018-04-03 ENCOUNTER — Emergency Department (HOSPITAL_COMMUNITY)
Admission: EM | Admit: 2018-04-03 | Discharge: 2018-04-03 | Disposition: A | Discharge status: Home / Self Care | Payer: BLUE CROSS/BLUE SHIELD | Attending: Emergency Medicine | Admitting: Emergency Medicine

## 2018-04-03 DIAGNOSIS — J45909 Unspecified asthma, uncomplicated: Secondary | ICD-10-CM | POA: Diagnosis not present

## 2018-04-03 DIAGNOSIS — R42 Dizziness and giddiness: Secondary | ICD-10-CM | POA: Insufficient documentation

## 2018-04-03 DIAGNOSIS — Z79899 Other long term (current) drug therapy: Secondary | ICD-10-CM | POA: Insufficient documentation

## 2018-04-03 LAB — CBC WITH DIFFERENTIAL/PLATELET
Abs Immature Granulocytes: 0.02 10*3/uL (ref 0.00–0.07)
Basophils Absolute: 0 10*3/uL (ref 0.0–0.1)
Basophils Relative: 1 %
EOS PCT: 1 %
Eosinophils Absolute: 0.1 10*3/uL (ref 0.0–0.5)
HCT: 40 % (ref 36.0–46.0)
HEMOGLOBIN: 13.1 g/dL (ref 12.0–15.0)
Immature Granulocytes: 0 %
LYMPHS PCT: 42 %
Lymphs Abs: 2.6 10*3/uL (ref 0.7–4.0)
MCH: 29.9 pg (ref 26.0–34.0)
MCHC: 32.8 g/dL (ref 30.0–36.0)
MCV: 91.3 fL (ref 80.0–100.0)
Monocytes Absolute: 0.4 10*3/uL (ref 0.1–1.0)
Monocytes Relative: 7 %
NEUTROS ABS: 3 10*3/uL (ref 1.7–7.7)
NEUTROS PCT: 49 %
NRBC: 0 % (ref 0.0–0.2)
Platelets: 206 10*3/uL (ref 150–400)
RBC: 4.38 MIL/uL (ref 3.87–5.11)
RDW: 12.1 % (ref 11.5–15.5)
WBC: 6.1 10*3/uL (ref 4.0–10.5)

## 2018-04-03 LAB — BASIC METABOLIC PANEL
Anion gap: 8 (ref 5–15)
BUN: 8 mg/dL (ref 6–20)
CHLORIDE: 109 mmol/L (ref 98–111)
CO2: 21 mmol/L — ABNORMAL LOW (ref 22–32)
CREATININE: 0.58 mg/dL (ref 0.44–1.00)
Calcium: 9.3 mg/dL (ref 8.9–10.3)
GFR calc Af Amer: >60 mL/min (ref >60)
GFR calc non Af Amer: >60 mL/min (ref >60)
Glucose, Bld: 89 mg/dL (ref 70–99)
Potassium: 3.8 mmol/L (ref 3.5–5.1)
SODIUM: 138 mmol/L (ref 135–145)

## 2018-04-03 LAB — I-STAT BETA HCG BLOOD, ED (MC, WL, AP ONLY)

## 2018-04-03 MED ORDER — ACETAMINOPHEN 500 MG PO TABS
500.0000 mg | ORAL_TABLET | Freq: Once | ORAL | Status: AC
  Administered 2018-04-03: 500 mg via ORAL
  Filled 2018-04-03: qty 1

## 2018-04-03 MED ORDER — MECLIZINE HCL 25 MG PO TABS
25.0000 mg | ORAL_TABLET | Freq: Once | ORAL | Status: AC
  Administered 2018-04-03: 25 mg via ORAL
  Filled 2018-04-03: qty 1

## 2018-04-03 MED ORDER — DIPHENHYDRAMINE HCL 50 MG/ML IJ SOLN
12.5000 mg | Freq: Once | INTRAMUSCULAR | Status: AC
  Administered 2018-04-03: 12.5 mg via INTRAVENOUS
  Filled 2018-04-03: qty 1

## 2018-04-03 MED ORDER — SODIUM CHLORIDE 0.9 % IV BOLUS
1000.0000 mL | Freq: Once | INTRAVENOUS | Status: AC
  Administered 2018-04-03: 1000 mL via INTRAVENOUS

## 2018-04-03 MED ORDER — MECLIZINE HCL 25 MG PO TABS: 25.0000 mg | ORAL_TABLET | Freq: Three times a day (TID) | ORAL | 0 refills | Status: DC | PRN

## 2018-04-03 NOTE — ED Notes (Signed)
Pt reports bila blurred vision and "running together."  She reports dx of concussion about 4.5 years ago d/t fall.  she was [redacted] weeks pregnant with  Her son.  She denies spinning sensation.  She also endorses having the same type of h/a 2 weeks ago, she took phenergan with relief.

## 2018-04-03 NOTE — ED Triage Notes (Signed)
Pt. Stated, I was at work and I got dizzy with N/V with some stomach pain.

## 2018-04-03 NOTE — ED Provider Notes (Signed)
[facility] Provider Note   CSN: [id number] Arrival date & time: 04/03/18  1741     History   Chief Complaint Chief Complaint  Patient presents with  . Dizziness  . Emesis  . Nausea  . Headache    HPI [name] is a 27 y.o. female presenting for dizziness and vomiting.  Patient states that she was at work today, not exerting herself, when she had a sudden onset of "room spinning "followed by nausea and 3 episodes of nonbloody, nonbilious vomiting.  Patient states that she has a history of vertigo and that her symptoms today feel the same however slightly worse than her normal vertigo.  Patient states that after her dizziness and vomiting began she began feeling anxious, short of breath similar to her normal panic attacks.  Patient states that after sitting down and trying to relax her anxiety and shortness of breath self resolved.  Patient states that upon arrival to emergency department she has developed a mild frontal headache that is 1/10 in severity, constant and without aggravating factors.  Upon arrival patient still endorsing dizziness that she describes as room spinning, states that has been constant since onset approximately 1 hour prior to arrival and is made worse with movement of the head.  Patient has not taken any medication for her symptoms.  Patient denies fever, neck pain, vision changes, chest pain, difficulty breathing, abdominal pain, diarrhea, hematemesis, bilious emesis or recent illness.  HPI  Past Medical History:  Diagnosis Date  . Allergy   . Anxiety   . Anxiety and depression   . Asthma   . Chest pain, unspecified   . Depression   . Dyspnea   . Gluten intolerance   . Hyperventilation   . IBS (irritable bowel syndrome)   . Malaise and fatigue   . Migraine   . Panic attack   . Panic attack   . Pregnancy induced hypertension   . SVD (spontaneous vaginal delivery) 08/31/2014    Patient Active Problem  List   Diagnosis Date Noted  . SVD (spontaneous vaginal delivery) 08/31/2014  . Labor and delivery indication for care or intervention 08/30/2014  . FHR (fetal heart rate) nonreactive   . [redacted] weeks gestation of pregnancy   . Encounter for fetal anatomic survey   . [redacted] weeks gestation of pregnancy   . PANIC ATTACK 10/27/2008  . ANXIETY DEPRESSION 10/27/2008    Past Surgical History:  Procedure Laterality Date  . ADENOIDECTOMY    . TONSILLECTOMY    . WISDOM TOOTH EXTRACTION       OB History    Gravida  1   Para  1   Term  1   Preterm      AB      Living  1     SAB      TAB      Ectopic      Multiple  0   Live Births  1            Home Medications    Prior to Admission medications   Medication Sig Start Date End Date Taking? Authorizing Provider  ciprofloxacin (CIPRO) 500 MG tablet Take 1 tablet (500 mg total) by mouth 2 (two) times daily. 12/30/16   McDonald, Mia A, PA-C  dicyclomine (BENTYL) 20 MG tablet Take 1 tablet (20 mg total) by mouth 2 (two) times daily. 12/30/16   McDonald, Mia A, PA-C  etonogestrel-ethinyl estradiol (NUVARING) 0.12-0.015 MG/24HR  vaginal ring Place 1 each vaginally every 28 (twenty-eight) days. Insert vaginally and leave in place for 3 consecutive weeks, then remove for 1 week.    [provider]  magnesium citrate SOLN Take 0.5 Bottles by mouth once.    [provider]  meclizine (ANTIVERT) 25 MG tablet Take 1 tablet (25 mg total) by mouth 3 (three) times daily as needed for dizziness. 04/03/18   Harlene Salts A, PA-C  ondansetron (ZOFRAN) 4 MG tablet Take 1 tablet (4 mg total) by mouth every 6 (six) hours. 12/30/16   McDonald, Mia A, PA-C  Probiotic Product (PROBIOTIC ADVANCED PO) Take 1 capsule by mouth daily.    [provider]    Family History Family History  Problem Relation Age of Onset  . Hypertension Mother   . Cancer Mother   . Heart disease Maternal Grandfather     Social History Social  History   Tobacco Use  . Smoking status: Never Smoker  . Smokeless tobacco: Never Used  Substance Use Topics  . Alcohol use: No  . Drug use: No     Allergies   Hydrocodone and Sulfa antibiotics   Review of Systems Review of Systems  Constitutional: Negative.  Negative for chills and fever.  HENT: Negative.  Negative for rhinorrhea and sore throat.   Eyes: Negative.  Negative for photophobia and visual disturbance.  Respiratory: Negative.  Negative for cough and shortness of breath.   Cardiovascular: Negative.  Negative for chest pain.  Gastrointestinal: Positive for nausea and vomiting. Negative for abdominal pain and diarrhea.  Genitourinary: Negative.  Negative for dysuria and hematuria.  Musculoskeletal: Negative.  Negative for arthralgias, myalgias, neck pain and neck stiffness.  Skin: Negative.  Negative for rash.  Neurological: Positive for dizziness and headaches. Negative for syncope, facial asymmetry, speech difficulty, weakness and numbness.   Physical Exam Updated Vital Signs BP 110/77   Pulse 86   Temp 98 F (36.7 C) (Oral)   Resp 14   Ht 5\' 4"  (1.626 m)   Wt 72.6 kg   LMP 03/13/2018   SpO2 100%   BMI 27.46 kg/m   Physical Exam  Constitutional: She is oriented to person, place, and time. She appears well-developed and well-nourished. She does not appear ill. No distress.  HENT:  Head: Normocephalic and atraumatic. Head is without raccoon's eyes and without Battle's sign.  Right Ear: Hearing, tympanic membrane, external ear and ear canal normal. No hemotympanum.  Left Ear: Hearing, tympanic membrane, external ear and ear canal normal. No hemotympanum.  Nose: Nose normal.  Mouth/Throat: Uvula is midline and oropharynx is clear and moist.  Eyes: Pupils are equal, round, and reactive to light. Conjunctivae and EOM are normal.  Neck: Trachea normal, normal range of motion, full passive range of motion without pain and phonation normal. Neck supple. No  tracheal tenderness, no spinous process tenderness and no muscular tenderness present. No tracheal deviation present. No Brudzinski's sign and no Kernig's sign noted.  Cardiovascular: Normal rate, regular rhythm, normal heart sounds and intact distal pulses.  Pulses:      Radial pulses are 2+ on the right side, and 2+ on the left side.       Dorsalis pedis pulses are 2+ on the right side, and 2+ on the left side.       Posterior tibial pulses are 2+ on the right side, and 2+ on the left side.  Pulmonary/Chest: Effort normal and breath sounds normal. No respiratory distress. She exhibits no  tenderness, no crepitus and no deformity.  Abdominal: Soft. Bowel sounds are normal. There is no tenderness. There is no rigidity, no rebound and no guarding.  Musculoskeletal: Normal range of motion. She exhibits no edema or tenderness.       Right lower leg: Normal.       Left lower leg: Normal.  Feet:  Right Foot:  Protective Sensation: 3 sites tested. 3 sites sensed.  Left Foot:  Protective Sensation: 3 sites tested. 3 sites sensed.  Neurological: She is alert and oriented to person, place, and time. She has normal strength. Coordination and gait normal. GCS eye subscore is 4. GCS verbal subscore is 5. GCS motor subscore is 6.  Mental Status: Alert, oriented, thought content appropriate, able to give a coherent history. Speech fluent without evidence of aphasia. Able to follow 2 step commands without difficulty. Cranial Nerves: II: Peripheral visual fields grossly normal, pupils equal, round, reactive to light III,IV, VI: ptosis not present, extra-ocular motions intact bilaterally V,VII: smile symmetric, eyebrows raise symmetric, facial light touch sensation equal VIII: hearing grossly normal to voice X: uvula elevates symmetrically XI: bilateral shoulder shrug symmetric and strong XII: midline tongue extension without fassiculations Motor: Normal tone. 5/5 strength in upper and lower  extremities bilaterally including strong and equal grip strength and dorsiflexion/plantar flexion Sensory: Sensation intact to light touch in all extremities.  Patient with some unsteadiness with initial testing however following meclizine patient with negative Romberg.  Cerebellar: normal finger-to-nose with bilateral upper extremities. Normal heel-to -shin balance bilaterally of the lower extremity. No pronator drift.  Gait: Upon initial evaluation patient with some unsteadiness upon standing and walking, this has completely resolved following meclizine CV: distal pulses palpable throughout  Skin: Skin is warm and dry. Capillary refill takes less than 2 seconds. She is not diaphoretic.  Psychiatric: She has a normal mood and affect. Her behavior is normal.     ED Treatments / Results  Labs (all labs ordered are listed, but only abnormal results are displayed) Labs Reviewed  BASIC METABOLIC PANEL - Abnormal; Notable for the following components:      Result Value   CO2 21 (*)    All other components within normal limits  CBC WITH DIFFERENTIAL/PLATELET  I-STAT BETA HCG BLOOD, ED ([facility], [facility], [facility] ONLY)    EKG EKG Interpretation  Date/Time:  Wednesday April 03 2018 19:28:02 EST Ventricular Rate:  73 PR Interval:    QRS Duration: 75 QT Interval:  387 QTC Calculation: 427 R Axis:   71 Text Interpretation:  Sinus rhythm RSR' in V1 or V2, probably normal variant No significant change since last tracing Confirmed by Melene Plan [phone]) on 04/03/2018 10:00:58 PM   Radiology No results found.  Procedures Procedures (including critical care time)  Medications Ordered in ED Medications  sodium chloride 0.9 % bolus 1,000 mL (0 mLs Intravenous Stopped 04/03/18 2116)  diphenhydrAMINE (BENADRYL) injection 12.5 mg (12.5 mg Intravenous Given 04/03/18 1942)  meclizine (ANTIVERT) tablet 25 mg (25 mg Oral Given 04/03/18 1942)  acetaminophen (TYLENOL) tablet 500 mg (500 mg Oral Given 04/03/18  1942)     Initial Impression / Assessment and Plan / ED Course  I have reviewed the triage vital signs and the nursing notes.  Pertinent labs & imaging results that were available during my care of the patient were reviewed by me and considered in my medical decision making (see chart for details).  Clinical Course as of Apr 03 2241  Wed Apr 03, 2018  2105  Patient reevaluated resting comfortably no acute distress.  States improvement of dizziness following meclizine.  States headache has resolved.   [BM]  2224 Patient reevaluated, resting comfortably no acute distress.  Patient is ambulatory in emergency department without assistance or difficulty.  Patient states resolution of dizziness at this time.   [BM]    Clinical Course User Index [BM] Bill Salinas, PA-C   Case discussed with Dr. Anitra Lauth upon initial evaluation, agrees with work-up at this time, beta-hCG, BMP, CBC, fluids, meclizine, Benadryl and Tylenol. -------------------------------------------------- Beta hCG negative CMP within normal limits BMP nonacute EKG without acute change confirmed by Dr. Adela Lank Afebrile, not tachycardic, not hypotensive, not tachypneic and 100% SPO2 on room air.  Following treatment as above patient states that her symptoms have completely resolved, she has been ambulatory here in emergency department without assistance or difficulty.  She denies dizziness, headache or any other symptoms at this time, states that she is feeling at baseline.  Patient with normal neurologic exam.  No vertical or rotational nystagmus. Patient normal finger-nose and normal gait.  No slurred speech or weakness.  Doubt CVA or other central cause of vertigo.  History and physical consistent with peripheral vertigo symptoms.  Will discharge home with meclizine. Patient instructed to followup with her primary care physician or neurology within the week for further evaluation. They are to return to the emergency  department for new neurologic symptoms, loss of vision or other concerning symptoms.  Case was rediscussed with Dr. Adela Lank, agrees with discharge and meclizine at this time with PCP follow-up.  At this time there does not appear to be any evidence of an acute emergency medical condition and the patient appears stable for discharge with appropriate outpatient follow up. Diagnosis was discussed with patient who verbalizes understanding of care plan and is agreeable to discharge. I have discussed return precautions with patient and family at bedside who verbalize understanding of return precautions. Patient strongly encouraged to follow-up with their PCP. All questions answered.   Note: Portions of this report may have been transcribed using voice recognition software. Every effort was made to ensure accuracy; however, inadvertent computerized transcription errors may still be present. Final Clinical Impressions(s) / ED Diagnoses   Final diagnoses:  Vertigo    ED Discharge Orders         Ordered    meclizine (ANTIVERT) 25 MG tablet  3 times daily PRN     04/03/18 2241           Bill Salinas, PA-C 04/03/18 2351    Melene Plan, DO 04/04/18 1508

## 2018-04-03 NOTE — Discharge Instructions (Addendum)
You have been diagnosed today with vertigo.  At this time there does not appear to be the presence of an emergent medical condition, however there is always the potential for conditions to change. Please read and follow the below instructions.  Please return to the Emergency Department immediately for any new or worsening symptoms or if your symptoms do not improve. Please be sure to follow up with your Primary Care Provider within 1 week regarding your visit today; please call their office to schedule an appointment even if you are feeling better for a follow-up visit. [facility], please call their office tomorrow to schedule a follow-up appointment regarding your vertigo within the next week. You may use the medication meclizine as scribed for vertigo symptoms.  Get help right away if: You have difficulty moving or speaking. You are always dizzy. You faint. You develop severe headaches. You have weakness in your hands, arms, or legs. You have changes in your hearing or vision. You develop a stiff neck. You develop sensitivity to light. You have a fever  Please read the additional information packets attached to your discharge summary.  Do not take your medicine if  develop an itchy rash, swelling in your mouth or lips, or difficulty breathing.

## 2018-04-03 NOTE — ED Notes (Signed)
Pt reports getting dizzy while at work today.  States she was putting "parts up" when things became blurry and felt like she was going to pass out.  Reports vomiting x 3.  Denies feeling sick prior.  Pt reports possibly having a panic attack episode after.  HX of panic attacks.  She reports SOB but denies cp.  She is A&Ox 4.  In NAD.

## 2018-05-08 ENCOUNTER — Ambulatory Visit (HOSPITAL_COMMUNITY)
Admission: EM | Admit: 2018-05-08 | Discharge: 2018-05-08 | Disposition: A | Discharge status: Home / Self Care | Payer: BLUE CROSS/BLUE SHIELD | Attending: Family Medicine | Admitting: Family Medicine

## 2018-05-08 ENCOUNTER — Encounter (HOSPITAL_COMMUNITY): Payer: Self-pay | Admitting: Emergency Medicine

## 2018-05-08 DIAGNOSIS — R69 Illness, unspecified: Secondary | ICD-10-CM | POA: Diagnosis not present

## 2018-05-08 DIAGNOSIS — J111 Influenza due to unidentified influenza virus with other respiratory manifestations: Secondary | ICD-10-CM

## 2018-05-08 MED ORDER — IBUPROFEN 600 MG PO TABS: 600.0000 mg | ORAL_TABLET | Freq: Four times a day (QID) | ORAL | 0 refills | Status: DC | PRN

## 2018-05-08 MED ORDER — PSEUDOEPH-BROMPHEN-DM 30-2-10 MG/5ML PO SYRP: 5.0000 mL | ORAL_SOLUTION | Freq: Four times a day (QID) | ORAL | 0 refills | Status: DC | PRN

## 2018-05-08 MED ORDER — ONDANSETRON 4 MG PO TBDP: 4.0000 mg | ORAL_TABLET | Freq: Three times a day (TID) | ORAL | 0 refills | Status: DC | PRN

## 2018-05-08 NOTE — Discharge Instructions (Signed)
Your symptoms are most likely viral and should resolve over the next week  He may use cough syrup as needed for cough and congestion, may supplement with a daily allergy pill like Zyrtec or Claritin generic over-the-counter as well to help with congestion and drainage  Use anti-inflammatories for pain/swelling. You may take up to 800 mg Ibuprofen every 8 hours with food. You may supplement Ibuprofen with Tylenol [phone] mg every 8 hours.   For nausea: Zofran prescribed. Begin with every 6 hours, than as you are able to hold food down, take it as needed. Start with clear liquids, then move to plain foods like bananas, rice, applesauce, toast, broth, grits, oatmeal. As those food settle okay you may transition to your normal foods. Avoid spicy and greasy foods as much as possible.  For Diarrhea: This is your body's natural way of getting rid of a virus. You may try taking 1 imodium to decrease amount of stools a day, but we do not want you to stop your diarrhea.   Preventing dehydration is key! You need to replace the fluid your body is expelling. Drink plenty of fluids, may use Pedialyte or sports drinks.   Please return if you are experiencing blood in your vomit or stool or experiencing dizziness, lightheadedness, extreme fatigue, increased abdominal pain.

## 2018-05-08 NOTE — ED Provider Notes (Signed)
[facility]    CSN: [id number] Arrival date & time: 05/08/18  1041     History   Chief Complaint Chief Complaint  Patient presents with  . Generalized Body Aches  . Nausea    HPI [name] is a 27 y.o. female no contributing past medical history presenting today for evaluation of URI symptoms, body aches and nausea states that beginning over the weekend on Saturday she began to develop nausea and diarrhea.  Symptoms persisted over the weekend and into early this week.  Diarrhea has tapered off and is almost resolved.  She has had continued nausea with vomiting.  4 times of vomiting this morning.  She has had associated URI symptoms of sore throat, dry cough and rhinorrhea.  She has also had associated body aches.  Noted a fever of 101 yesterday.  She has been tolerating liquids of ginger ale, but poor solid tolerance.  Has used Tylenol Cold and flu.  Last menstrual period was towards the end of November.  Uses NuvaRing for birth control.  HPI  Past Medical History:  Diagnosis Date  . Allergy   . Anxiety   . Anxiety and depression   . Asthma   . Chest pain, unspecified   . Depression   . Dyspnea   . Gluten intolerance   . Hyperventilation   . IBS (irritable bowel syndrome)   . Malaise and fatigue   . Migraine   . Panic attack   . Panic attack   . Pregnancy induced hypertension   . SVD (spontaneous vaginal delivery) 08/31/2014    Patient Active Problem List   Diagnosis Date Noted  . SVD (spontaneous vaginal delivery) 08/31/2014  . Labor and delivery indication for care or intervention 08/30/2014  . FHR (fetal heart rate) nonreactive   . [redacted] weeks gestation of pregnancy   . Encounter for fetal anatomic survey   . [redacted] weeks gestation of pregnancy   . PANIC ATTACK 10/27/2008  . ANXIETY DEPRESSION 10/27/2008    Past Surgical History:  Procedure Laterality Date  . ADENOIDECTOMY    . TONSILLECTOMY    . WISDOM TOOTH EXTRACTION      OB History    Gravida  1   Para  1   Term  1   Preterm      AB      Living  1     SAB      TAB      Ectopic      Multiple  0   Live Births  1            Home Medications    Prior to Admission medications   Medication Sig Start Date End Date Taking? Authorizing Provider  brompheniramine-pseudoephedrine-DM 30-2-10 MG/5ML syrup Take 5 mLs by mouth 4 (four) times daily as needed. 05/08/18   Rider Ermis C, PA-C  ciprofloxacin (CIPRO) 500 MG tablet Take 1 tablet (500 mg total) by mouth 2 (two) times daily. 12/30/16   McDonald, Mia A, PA-C  dicyclomine (BENTYL) 20 MG tablet Take 1 tablet (20 mg total) by mouth 2 (two) times daily. 12/30/16   McDonald, Mia A, PA-C  etonogestrel-ethinyl estradiol (NUVARING) 0.12-0.015 MG/24HR vaginal ring Place 1 each vaginally every 28 (twenty-eight) days. Insert vaginally and leave in place for 3 consecutive weeks, then remove for 1 week.    [provider]  ibuprofen (ADVIL,MOTRIN) 600 MG tablet Take 1 tablet (600 mg total) by mouth every 6 (six) hours as needed.  05/08/18   Keyosha Tiedt C, PA-C  magnesium citrate SOLN Take 0.5 Bottles by mouth once.    [provider]  meclizine (ANTIVERT) 25 MG tablet Take 1 tablet (25 mg total) by mouth 3 (three) times daily as needed for dizziness. 04/03/18   Harlene Salts A, PA-C  ondansetron (ZOFRAN ODT) 4 MG disintegrating tablet Take 1 tablet (4 mg total) by mouth every 8 (eight) hours as needed for nausea or vomiting. 05/08/18   Zalika Tieszen C, PA-C  ondansetron (ZOFRAN) 4 MG tablet Take 1 tablet (4 mg total) by mouth every 6 (six) hours. 12/30/16   McDonald, Mia A, PA-C  Probiotic Product (PROBIOTIC ADVANCED PO) Take 1 capsule by mouth daily.    [provider]    Family History Family History  Problem Relation Age of Onset  . Hypertension Mother   . Cancer Mother   . Heart disease Maternal Grandfather     Social History Social History   Tobacco Use  . Smoking status:  Never Smoker  . Smokeless tobacco: Never Used  Substance Use Topics  . Alcohol use: No  . Drug use: No     Allergies   Hydrocodone and Sulfa antibiotics   Review of Systems Review of Systems  Constitutional: Positive for appetite change, chills and fever. Negative for activity change and fatigue.  HENT: Positive for congestion, rhinorrhea and sore throat. Negative for ear pain, sinus pressure and trouble swallowing.   Eyes: Negative for discharge and redness.  Respiratory: Positive for cough. Negative for chest tightness and shortness of breath.   Cardiovascular: Negative for chest pain.  Gastrointestinal: Positive for abdominal pain, diarrhea and nausea. Negative for vomiting.  Musculoskeletal: Negative for myalgias.  Skin: Negative for rash.  Neurological: Negative for dizziness, light-headedness and headaches.     Physical Exam Triage Vital Signs ED Triage Vitals  Enc Vitals Group     BP 05/08/18 1146 122/87     Pulse Rate 05/08/18 1146 86     Resp 05/08/18 1146 16     Temp 05/08/18 1146 98 F (36.7 C)     Temp Source 05/08/18 1146 Oral     SpO2 05/08/18 1146 100 %     Weight --      Height --      Head Circumference --      Peak Flow --      Pain Score 05/08/18 1148 5     Pain Loc --      Pain Edu? --      Excl. in GC? --    No data found.  Updated Vital Signs BP 122/87 (BP Location: Right Arm)   Pulse 86   Temp 98 F (36.7 C) (Oral)   Resp 16   SpO2 100%   Visual Acuity Right Eye Distance:   Left Eye Distance:   Bilateral Distance:    Right Eye Near:   Left Eye Near:    Bilateral Near:     Physical Exam Vitals signs and nursing note reviewed.  Constitutional:      General: She is not in acute distress.    Appearance: She is well-developed.  HENT:     Head: Normocephalic and atraumatic.     Ears:     Comments: Bilateral ears without tenderness to palpation of external auricle, tragus and mastoid, EAC's without erythema or swelling, TM's  with good bony landmarks and cone of light. Non erythematous.    Mouth/Throat:     Comments: Oral mucosa  pink and moist, no tonsillar enlargement or exudate. Posterior pharynx patent and nonerythematous, no uvula deviation or swelling. Normal phonation. Eyes:     Conjunctiva/sclera: Conjunctivae normal.  Neck:     Musculoskeletal: Neck supple.  Cardiovascular:     Rate and Rhythm: Normal rate and regular rhythm.     Heart sounds: No murmur.  Pulmonary:     Effort: Pulmonary effort is normal. No respiratory distress.     Breath sounds: Normal breath sounds.     Comments: Breathing comfortably at rest, CTABL, no wheezing, rales or other adventitious sounds auscultated Abdominal:     Palpations: Abdomen is soft.     Tenderness: There is abdominal tenderness.     Comments: Tenderness to bilateral lower quadrants of abdomen, negative rebound, negative Rovsing.,  Negative McBurney.  Skin:    General: Skin is warm and dry.  Neurological:     Mental Status: She is alert.      UC Treatments / Results  Labs (all labs ordered are listed, but only abnormal results are displayed) Labs Reviewed - No data to display  EKG None  Radiology No results found.  Procedures Procedures (including critical care time)  Medications Ordered in UC Medications - No data to display  Initial Impression / Assessment and Plan / UC Course  I have reviewed the triage vital signs and the nursing notes.  Pertinent labs & imaging results that were available during my care of the patient were reviewed by me and considered in my medical decision making (see chart for details).     Patient with URI symptoms and GI upset for approximately 4 days.  Most likely viral illness, possible influenza.  Do not recommend Tamiflu given GI upset as well as on day 4 of symptoms.  Recommending continued symptomatic and supportive care.  Recommendations below.  Patient to follow-up this weekend if not having any improvement  of symptoms.Discussed strict return precautions. Patient verbalized understanding and is agreeable with plan.  Final Clinical Impressions(s) / UC Diagnoses   Final diagnoses:  Influenza-like illness     Discharge Instructions     Your symptoms are most likely viral and should resolve over the next week  He may use cough syrup as needed for cough and congestion, may supplement with a daily allergy pill like Zyrtec or Claritin generic over-the-counter as well to help with congestion and drainage  Use anti-inflammatories for pain/swelling. You may take up to 800 mg Ibuprofen every 8 hours with food. You may supplement Ibuprofen with Tylenol [phone] mg every 8 hours.   For nausea: Zofran prescribed. Begin with every 6 hours, than as you are able to hold food down, take it as needed. Start with clear liquids, then move to plain foods like bananas, rice, applesauce, toast, broth, grits, oatmeal. As those food settle okay you may transition to your normal foods. Avoid spicy and greasy foods as much as possible.  For Diarrhea: This is your body's natural way of getting rid of a virus. You may try taking 1 imodium to decrease amount of stools a day, but we do not want you to stop your diarrhea.   Preventing dehydration is key! You need to replace the fluid your body is expelling. Drink plenty of fluids, may use Pedialyte or sports drinks.   Please return if you are experiencing blood in your vomit or stool or experiencing dizziness, lightheadedness, extreme fatigue, increased abdominal pain.    ED Prescriptions    Medication Sig Dispense Auth. Provider  brompheniramine-pseudoephedrine-DM 30-2-10 MG/5ML syrup Take 5 mLs by mouth 4 (four) times daily as needed. 120 mL Keishon Chavarin C, PA-C   ibuprofen (ADVIL,MOTRIN) 600 MG tablet Take 1 tablet (600 mg total) by mouth every 6 (six) hours as needed. 30 tablet Virlee Stroschein C, PA-C   ondansetron (ZOFRAN ODT) 4 MG disintegrating tablet Take 1  tablet (4 mg total) by mouth every 8 (eight) hours as needed for nausea or vomiting. 20 tablet Kaeya Schiffer, [place]eland[place]ie C, PA-C     Controlled Substance Prescriptions [place] Controlled Substance Registry consulted? Not Applicable   Lew DawesWieters, Devany Aja C, [place] JerseyPA-C 05/08/18 1227

## 2018-05-08 NOTE — ED Triage Notes (Signed)
Pt sts body aches and nausea x 4 days

## 2018-08-31 IMAGING — US US TRANSVAGINAL NON-OB
1 series · 13 of 25 positions shown · non-contrast
Comparison: CT abdomen and pelvis June 29, 2016

CLINICAL DATA: Right lower abdominal and pelvic pain. Vaginal
discharge

EXAM:
TRANSABDOMINAL AND TRANSVAGINAL ULTRASOUND OF PELVIS
TECHNIQUE: Study was performed transabdominally to optimize pelvic field of
view evaluation and transvaginally to optimize internal visceral
architecture evaluation.

[Series 1: us transvaginal non-ob · 0.24mm/px · 13 of 90 slices shown]
[im 1/90]
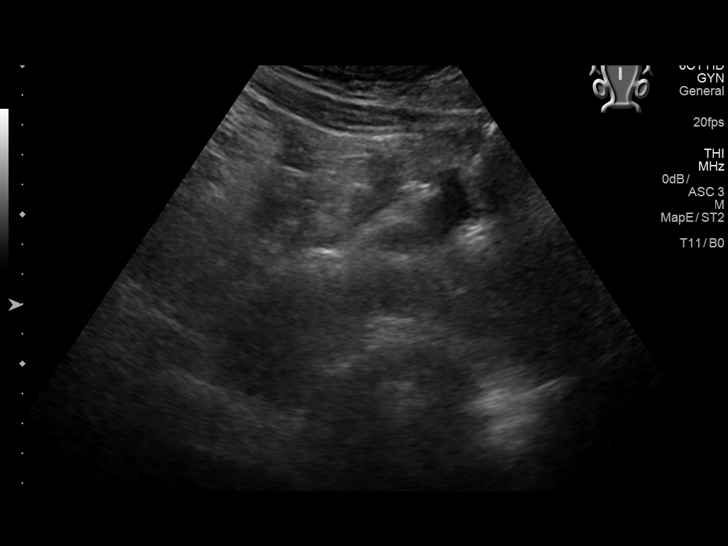
[im 8/90]
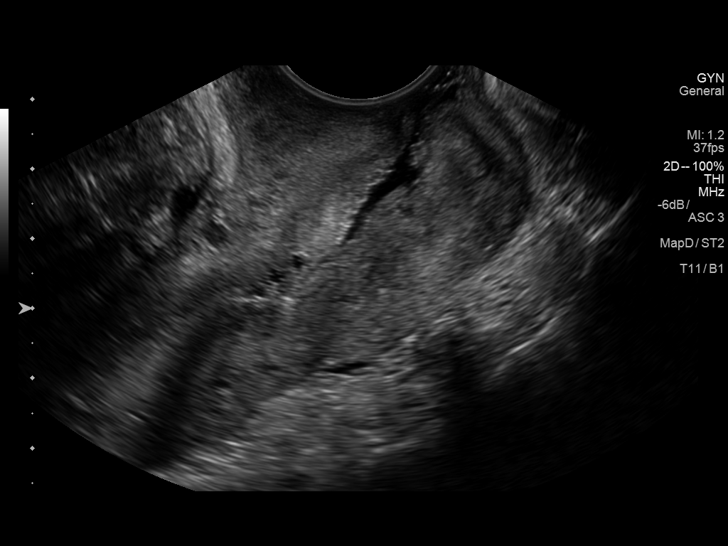
[im 15/90]
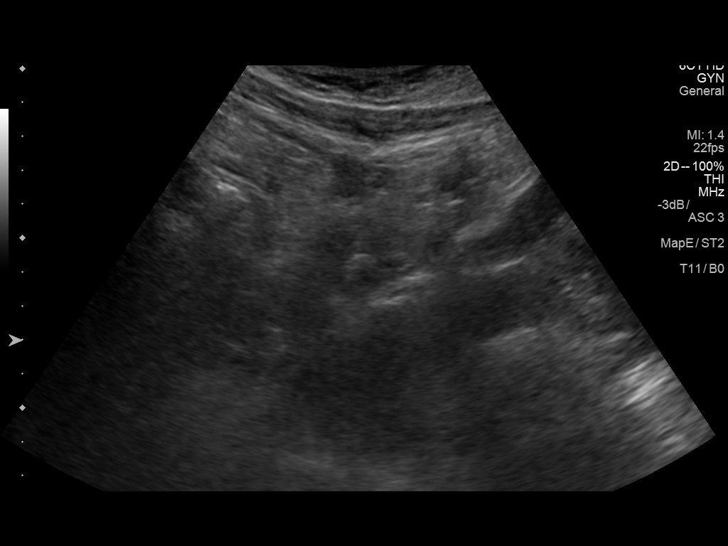
[im 23/90]
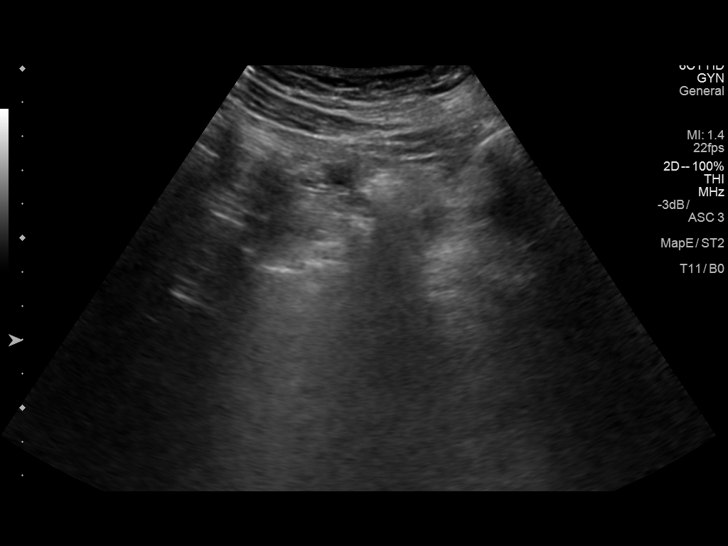
[im 30/90]
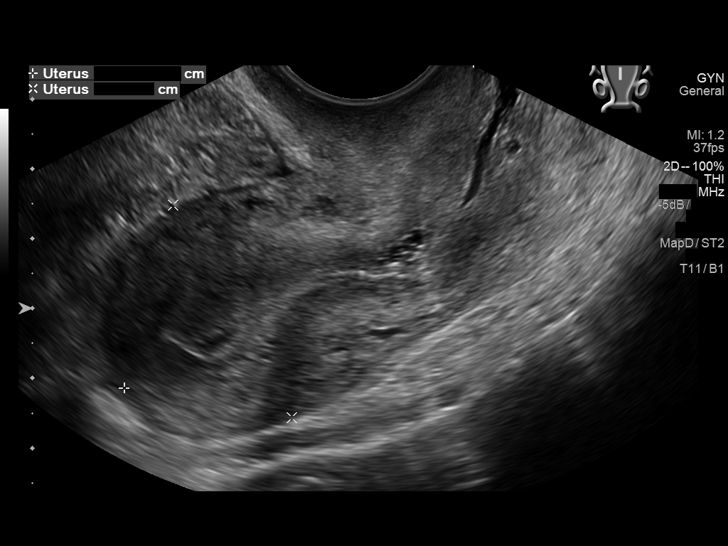
[im 38/90]
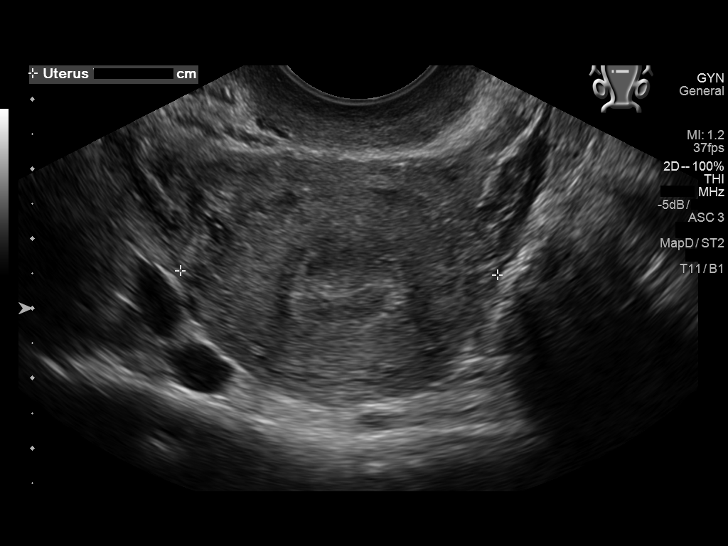
[im 45/90]
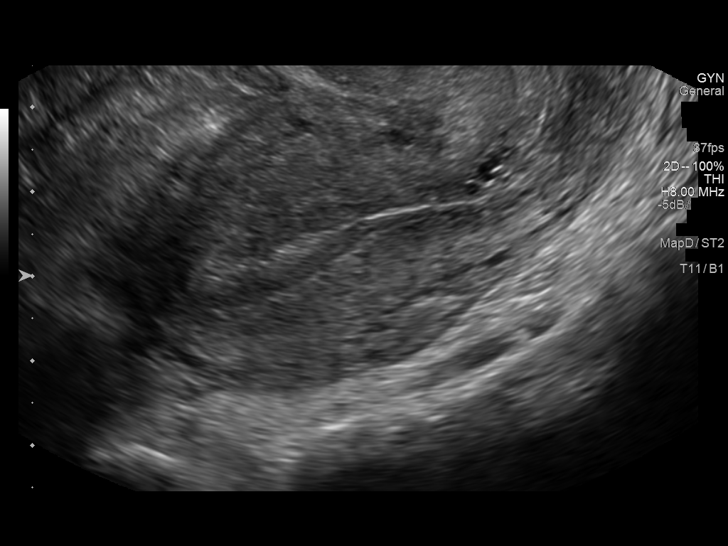
[im 52/90]
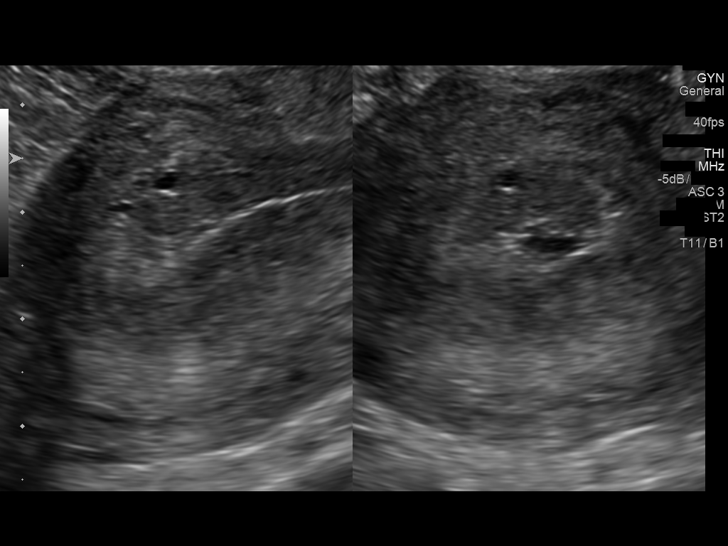
[im 60/90]
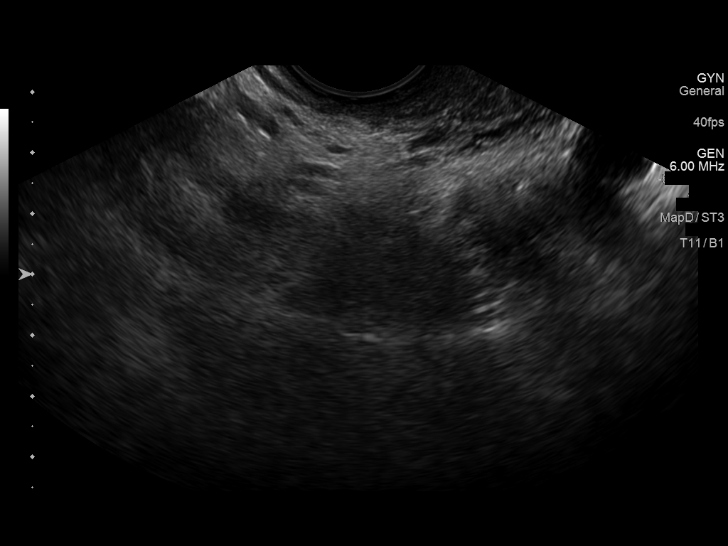
[im 67/90]
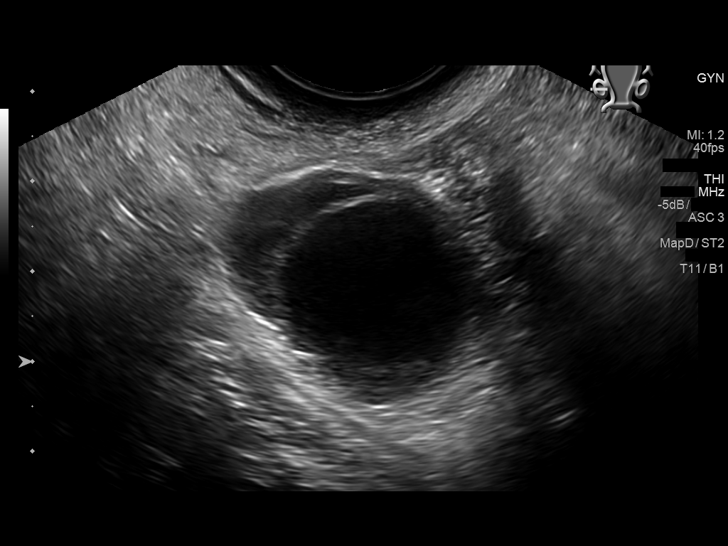
[im 75/90]
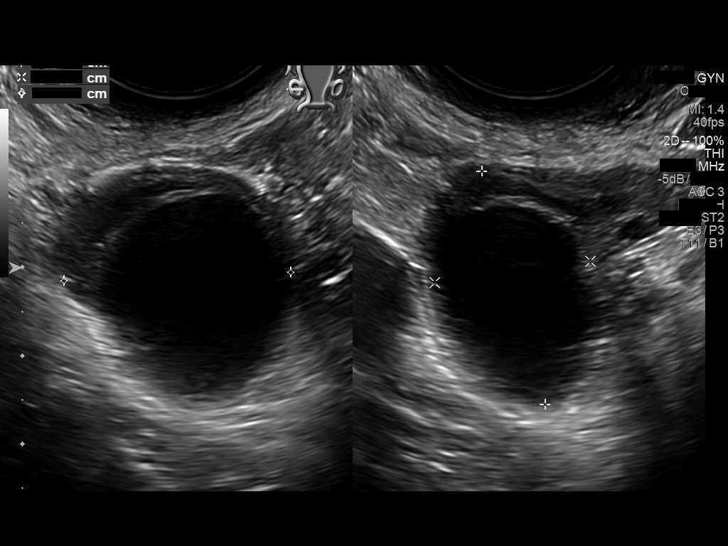
[im 82/90]
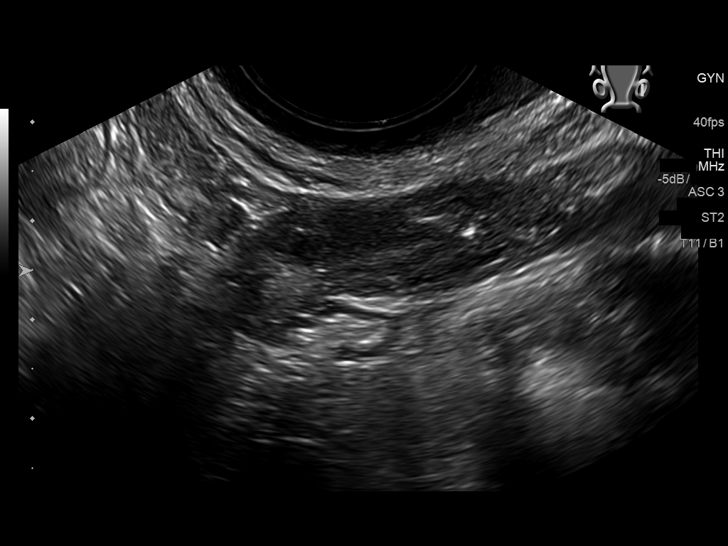
[im 90/90]
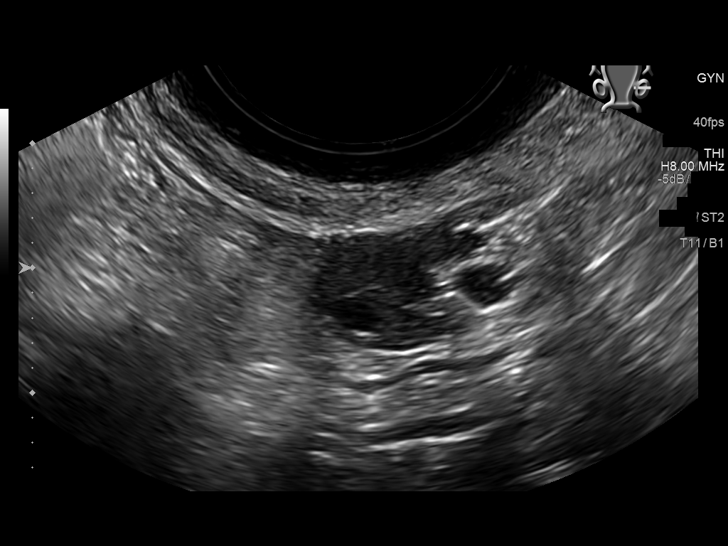

[13 of 25 positions shown; findings below may reference images not displayed]

FINDINGS: Uterus

Measurements: 6.8 x 3.5 x 4.5 cm. Uterus is mildly retroverted.. No
fibroids or other mass visualized.

Endometrium

Thickness: 9 mm. The endometrium has a mildly inhomogeneous
echotexture with tiny calcifications and scattered areas of mild
cystic change and fluid. Largest cyst measures 2 mm.

Right ovary

Measurements: 3.0 x 2.9 x 2.7 cm. There is a cystic mass arising
from the right ovary which contains a few septations, possibly a
hemorrhagic follicle. No other right-sided pelvic mass.

Left ovary

Measurements: 3.0 x 1.1 x 1.0 cm. Normal appearance/no adnexal mass.

Other findings

No abnormal free fluid.
IMPRESSION: The endometrium is not thickened for age. However, the endometrium
has a mildly complex echotexture pattern with tiny calcifications
and mild cystic change. Given this appearance, a follow-up
ultrasound in 6 to 8 weeks, ideally at the end of menstrual cycle,
advised to further evaluate.

Septated cystic lesion right ovary, probably a hemorrhagic
cyst/dominant follicle. Short-interval follow up ultrasound in 6-12
weeks is recommended, preferably during the week following the
patient's normal menses. Given the endometrium appearance, reimaging
closer to 6 weeks as opposed to 12 weeks may be warranted.

Study otherwise unremarkable.

## 2019-04-22 ENCOUNTER — Other Ambulatory Visit: Payer: Self-pay

## 2019-04-22 DIAGNOSIS — Z20822 Contact with and (suspected) exposure to covid-19: Secondary | ICD-10-CM

## 2019-04-24 LAB — NOVEL CORONAVIRUS, NAA: SARS-CoV-2, NAA: DETECTED — AB

## 2019-10-29 ENCOUNTER — Inpatient Hospital Stay (HOSPITAL_COMMUNITY)
Admission: EM | Admit: 2019-10-29 | Discharge: 2019-10-30 | DRG: 103 | Disposition: A | Discharge status: Home / Self Care | Payer: BLUE CROSS/BLUE SHIELD | Attending: Neurology | Admitting: Neurology

## 2019-10-29 ENCOUNTER — Other Ambulatory Visit: Payer: Self-pay

## 2019-10-29 ENCOUNTER — Emergency Department (HOSPITAL_COMMUNITY): Payer: BLUE CROSS/BLUE SHIELD

## 2019-10-29 DIAGNOSIS — R29703 NIHSS score 3: Secondary | ICD-10-CM | POA: Diagnosis present

## 2019-10-29 DIAGNOSIS — G43109 Migraine with aura, not intractable, without status migrainosus: Secondary | ICD-10-CM | POA: Diagnosis not present

## 2019-10-29 DIAGNOSIS — R519 Headache, unspecified: Secondary | ICD-10-CM | POA: Diagnosis not present

## 2019-10-29 DIAGNOSIS — Z793 Long term (current) use of hormonal contraceptives: Secondary | ICD-10-CM | POA: Diagnosis not present

## 2019-10-29 DIAGNOSIS — R531 Weakness: Secondary | ICD-10-CM

## 2019-10-29 DIAGNOSIS — K589 Irritable bowel syndrome without diarrhea: Secondary | ICD-10-CM | POA: Diagnosis present

## 2019-10-29 DIAGNOSIS — Z20822 Contact with and (suspected) exposure to covid-19: Secondary | ICD-10-CM | POA: Diagnosis present

## 2019-10-29 DIAGNOSIS — R29700 NIHSS score 0: Secondary | ICD-10-CM | POA: Diagnosis not present

## 2019-10-29 DIAGNOSIS — F410 Panic disorder [episodic paroxysmal anxiety] without agoraphobia: Secondary | ICD-10-CM | POA: Diagnosis present

## 2019-10-29 DIAGNOSIS — F341 Dysthymic disorder: Secondary | ICD-10-CM | POA: Diagnosis present

## 2019-10-29 DIAGNOSIS — Z882 Allergy status to sulfonamides status: Secondary | ICD-10-CM

## 2019-10-29 DIAGNOSIS — E663 Overweight: Secondary | ICD-10-CM | POA: Diagnosis present

## 2019-10-29 DIAGNOSIS — Z79899 Other long term (current) drug therapy: Secondary | ICD-10-CM | POA: Diagnosis not present

## 2019-10-29 DIAGNOSIS — Z8249 Family history of ischemic heart disease and other diseases of the circulatory system: Secondary | ICD-10-CM

## 2019-10-29 DIAGNOSIS — G43909 Migraine, unspecified, not intractable, without status migrainosus: Secondary | ICD-10-CM | POA: Diagnosis present

## 2019-10-29 DIAGNOSIS — F418 Other specified anxiety disorders: Secondary | ICD-10-CM | POA: Diagnosis present

## 2019-10-29 DIAGNOSIS — R2990 Unspecified symptoms and signs involving the nervous system: Secondary | ICD-10-CM | POA: Diagnosis not present

## 2019-10-29 DIAGNOSIS — R200 Anesthesia of skin: Secondary | ICD-10-CM

## 2019-10-29 DIAGNOSIS — Z6828 Body mass index (BMI) 28.0-28.9, adult: Secondary | ICD-10-CM

## 2019-10-29 DIAGNOSIS — I6389 Other cerebral infarction: Secondary | ICD-10-CM | POA: Diagnosis not present

## 2019-10-29 DIAGNOSIS — Z885 Allergy status to narcotic agent status: Secondary | ICD-10-CM | POA: Diagnosis not present

## 2019-10-29 DIAGNOSIS — G8194 Hemiplegia, unspecified affecting left nondominant side: Secondary | ICD-10-CM | POA: Diagnosis present

## 2019-10-29 LAB — CBC
HCT: 39.4 % (ref 36.0–46.0)
Hemoglobin: 13.7 g/dL (ref 12.0–15.0)
MCH: 31.1 pg (ref 26.0–34.0)
MCHC: 34.8 g/dL (ref 30.0–36.0)
MCV: 89.3 fL (ref 80.0–100.0)
Platelets: 228 10*3/uL (ref 150–400)
RBC: 4.41 MIL/uL (ref 3.87–5.11)
RDW: 11.9 % (ref 11.5–15.5)
WBC: 7.6 10*3/uL (ref 4.0–10.5)
nRBC: 0 % (ref 0.0–0.2)

## 2019-10-29 LAB — SARS CORONAVIRUS 2 BY RT PCR (HOSPITAL ORDER, PERFORMED IN ~~LOC~~ HOSPITAL LAB): SARS Coronavirus 2: NEGATIVE

## 2019-10-29 LAB — COMPREHENSIVE METABOLIC PANEL
ALT: 16 U/L (ref 0–44)
AST: 18 U/L (ref 15–41)
Albumin: 4.2 g/dL (ref 3.5–5.0)
Alkaline Phosphatase: 66 U/L (ref 38–126)
Anion gap: 9 (ref 5–15)
BUN: 12 mg/dL (ref 6–20)
CO2: 21 mmol/L — ABNORMAL LOW (ref 22–32)
Calcium: 9.6 mg/dL (ref 8.9–10.3)
Chloride: 106 mmol/L (ref 98–111)
Creatinine, Ser: 0.63 mg/dL (ref 0.44–1.00)
GFR calc Af Amer: >60 mL/min (ref >60)
GFR calc non Af Amer: >60 mL/min (ref >60)
Glucose, Bld: 115 mg/dL — ABNORMAL HIGH (ref 70–99)
Potassium: 3.9 mmol/L (ref 3.5–5.1)
Sodium: 136 mmol/L (ref 135–145)
Total Bilirubin: 0.4 mg/dL (ref 0.3–1.2)
Total Protein: 7.7 g/dL (ref 6.5–8.1)

## 2019-10-29 LAB — DIFFERENTIAL
Abs Immature Granulocytes: 0.01 10*3/uL (ref 0.00–0.07)
Basophils Absolute: 0 10*3/uL (ref 0.0–0.1)
Basophils Relative: 0 %
Eosinophils Absolute: 0.1 10*3/uL (ref 0.0–0.5)
Eosinophils Relative: 1 %
Immature Granulocytes: 0 %
Lymphocytes Relative: 44 %
Lymphs Abs: 3.4 10*3/uL (ref 0.7–4.0)
Monocytes Absolute: 0.5 10*3/uL (ref 0.1–1.0)
Monocytes Relative: 7 %
Neutro Abs: 3.6 10*3/uL (ref 1.7–7.7)
Neutrophils Relative %: 48 %

## 2019-10-29 LAB — I-STAT BETA HCG BLOOD, ED (MC, WL, AP ONLY): I-stat hCG, quantitative: <5 m[IU]/mL (ref <5)

## 2019-10-29 LAB — I-STAT CHEM 8, ED
BUN: 11 mg/dL (ref 6–20)
Calcium, Ion: 1.17 mmol/L (ref 1.15–1.40)
Chloride: 105 mmol/L (ref 98–111)
Creatinine, Ser: 0.5 mg/dL (ref 0.44–1.00)
Glucose, Bld: 111 mg/dL — ABNORMAL HIGH (ref 70–99)
HCT: 39 % (ref 36.0–46.0)
Hemoglobin: 13.3 g/dL (ref 12.0–15.0)
Potassium: 3.9 mmol/L (ref 3.5–5.1)
Sodium: 139 mmol/L (ref 135–145)
TCO2: 19 mmol/L — ABNORMAL LOW (ref 22–32)

## 2019-10-29 LAB — GLUCOSE, CAPILLARY: Glucose-Capillary: 106 mg/dL — ABNORMAL HIGH (ref 70–99)

## 2019-10-29 LAB — PROTIME-INR
INR: 0.9 (ref 0.8–1.2)
Prothrombin Time: 12.2 seconds (ref 11.4–15.2)

## 2019-10-29 LAB — APTT: aPTT: 26 seconds (ref 24–36)

## 2019-10-29 LAB — CBG MONITORING, ED: Glucose-Capillary: 102 mg/dL — ABNORMAL HIGH (ref 70–99)

## 2019-10-29 LAB — ETHANOL: Alcohol, Ethyl (B): <10 mg/dL (ref <10)

## 2019-10-29 MED ORDER — METOCLOPRAMIDE HCL 5 MG/ML IJ SOLN
10.0000 mg | Freq: Once | INTRAMUSCULAR | Status: AC
  Administered 2019-10-29: 10 mg via INTRAVENOUS
  Filled 2019-10-29: qty 2

## 2019-10-29 MED ORDER — SODIUM CHLORIDE 0.9 % IV SOLN
50.0000 mL | Freq: Once | INTRAVENOUS | Status: AC
  Administered 2019-10-29: 50 mL via INTRAVENOUS

## 2019-10-29 MED ORDER — PANTOPRAZOLE SODIUM 40 MG IV SOLR
40.0000 mg | Freq: Every day | INTRAVENOUS | Status: DC
  Administered 2019-10-29: 40 mg via INTRAVENOUS

## 2019-10-29 MED ORDER — SODIUM CHLORIDE 0.9 % IV SOLN: 50.0000 mL | Freq: Once | INTRAVENOUS | Status: DC

## 2019-10-29 MED ORDER — ACETAMINOPHEN 325 MG PO TABS
650.0000 mg | ORAL_TABLET | ORAL | Status: DC | PRN
  Administered 2019-10-30 (×2): 650 mg via ORAL
  Filled 2019-10-29 (×2): qty 2

## 2019-10-29 MED ORDER — ALTEPLASE 100 MG IV SOLR
INTRAVENOUS | Status: AC
  Filled 2019-10-29: qty 100

## 2019-10-29 MED ORDER — ACETAMINOPHEN 160 MG/5ML PO SOLN: 650.0000 mg | ORAL | Status: DC | PRN

## 2019-10-29 MED ORDER — ALTEPLASE (STROKE) FULL DOSE INFUSION
0.9000 mg/kg | Freq: Once | INTRAVENOUS | Status: AC
  Administered 2019-10-29: 67.7 mg via INTRAVENOUS

## 2019-10-29 MED ORDER — STROKE: EARLY STAGES OF RECOVERY BOOK: Freq: Once | Status: DC

## 2019-10-29 MED ORDER — CHLORHEXIDINE GLUCONATE CLOTH 2 % EX PADS
6.0000 | MEDICATED_PAD | Freq: Every day | CUTANEOUS | Status: DC
  Administered 2019-10-29 – 2019-10-30 (×2): 6 via TOPICAL

## 2019-10-29 MED ORDER — SODIUM CHLORIDE 0.9 % IV SOLN: INTRAVENOUS | Status: DC

## 2019-10-29 MED ORDER — ACETAMINOPHEN 650 MG RE SUPP: 650.0000 mg | RECTAL | Status: DC | PRN

## 2019-10-29 MED ORDER — ALTEPLASE (STROKE) FULL DOSE INFUSION: 0.9000 mg/kg | Freq: Once | INTRAVENOUS | Status: DC

## 2019-10-29 MED ORDER — SENNOSIDES-DOCUSATE SODIUM 8.6-50 MG PO TABS: 1.0000 | ORAL_TABLET | Freq: Every evening | ORAL | Status: DC | PRN

## 2019-10-29 MED ORDER — SODIUM CHLORIDE 0.9 % IV SOLN
50.0000 mL | Freq: Once | INTRAVENOUS | Status: DC
Start: 2019-10-29 — End: 2019-10-29

## 2019-10-29 MED ORDER — ALTEPLASE (STROKE) FULL DOSE INFUSION
0.9000 mg/kg | Freq: Once | INTRAVENOUS | Status: DC
Start: 2019-10-29 — End: 2019-10-29

## 2019-10-29 NOTE — ED Notes (Signed)
Pt reports her left arm is "starting to go back to normal" and her sensation is now equal on bilateral sides of her body.

## 2019-10-29 NOTE — ED Provider Notes (Signed)
[facility] EMERGENCY DEPARTMENT Provider Note   CSN: [id number] Arrival date & time: 10/29/19  1500  An emergency department physician performed an initial assessment on this suspected stroke patient at 1514.  History Chief Complaint  Patient presents with  . Code Stroke    [name] is a 29 y.o. female.  29 year old female presents with complaint of left-sided numbness and weakness.  Patient states that she was here at the hospital work at 1:30 when she noticed her computer screen looked blurry, she tried to call somebody to ask for help but could not remember what she was doing.  Patient then developed numbness from the left side of her face all the way down the entire left side of her body, states that it feels "puffy."  Patient feels like when she walks she is leaning towards the right.  Patient states that she has had anxiety attacks in the past however does not feel similar.  Denies chest pain, nausea, vomiting, headache.  Denies history of hypertension or hyperlipidemia, no history of diabetes.  No other complaints or concerns.        Past Medical History:  Diagnosis Date  . Allergy   . Anxiety   . Anxiety and depression   . Asthma   . Chest pain, unspecified   . Depression   . Dyspnea   . Gluten intolerance   . Hyperventilation   . IBS (irritable bowel syndrome)   . Malaise and fatigue   . Migraine   . Panic attack   . Panic attack   . Pregnancy induced hypertension   . SVD (spontaneous vaginal delivery) 08/31/2014    Patient Active Problem List   Diagnosis Date Noted  . SVD (spontaneous vaginal delivery) 08/31/2014  . Labor and delivery indication for care or intervention 08/30/2014  . FHR (fetal heart rate) nonreactive   . [redacted] weeks gestation of pregnancy   . Encounter for fetal anatomic survey   . [redacted] weeks gestation of pregnancy   . PANIC ATTACK 10/27/2008  . ANXIETY DEPRESSION 10/27/2008    Past Surgical History:  Procedure Laterality Date  .  ADENOIDECTOMY    . TONSILLECTOMY    . WISDOM TOOTH EXTRACTION       OB History    Gravida  1   Para  1   Term  1   Preterm      AB      Living  1     SAB      TAB      Ectopic      Multiple  0   Live Births  1           Family History  Problem Relation Age of Onset  . Hypertension Mother   . Cancer Mother   . Heart disease Maternal Grandfather     Social History   Tobacco Use  . Smoking status: Never Smoker  . Smokeless tobacco: Never Used  Substance Use Topics  . Alcohol use: No  . Drug use: No    Home Medications Prior to Admission medications   Medication Sig Start Date End Date Taking? Authorizing Provider  etonogestrel-ethinyl estradiol (NUVARING) 0.12-0.015 MG/24HR vaginal ring Place 1 each vaginally every 28 (twenty-eight) days. Insert vaginally and leave in place for 3 consecutive weeks, then remove for 1 week.   Yes [provider]  ferrous sulfate 325 (65 FE) MG tablet Take 325 mg by mouth daily with breakfast.   Yes [provider]  MAGNESIUM PO Take 1 tablet by mouth daily.   Yes [provider]  Probiotic Product (PROBIOTIC ADVANCED PO) Take 1 capsule by mouth daily.   Yes [provider]    Allergies    Hydrocodone and Sulfa antibiotics  Review of Systems   Review of Systems  Constitutional: Negative for fever.  Eyes: Positive for visual disturbance.  Gastrointestinal: Negative for nausea and vomiting.  Musculoskeletal: Negative for arthralgias and myalgias.  Skin: Negative for rash and wound.  Allergic/Immunologic: Negative for immunocompromised state.  Neurological: Positive for weakness and numbness. Negative for facial asymmetry, speech difficulty and headaches.  Psychiatric/Behavioral: Positive for confusion.  All other systems reviewed and are negative.   Physical Exam Updated Vital Signs BP 116/79   Pulse 71   Temp 97.9 F (36.6 C) (Oral)   Resp 14   Ht 5\' 4"  (1.626 m)   Wt  75.2 kg   LMP 10/15/2019   SpO2 100%   Breastfeeding No   BMI 28.46 kg/m   Physical Exam Vitals and nursing note reviewed.  Constitutional:      General: She is not in acute distress.    Appearance: She is well-developed. She is not diaphoretic.  HENT:     Head: Normocephalic and atraumatic.     Mouth/Throat:     Mouth: Mucous membranes are dry.  Eyes:     Extraocular Movements: Extraocular movements intact.     Pupils: Pupils are equal, round, and reactive to light.  Cardiovascular:     Rate and Rhythm: Normal rate and regular rhythm.     Pulses: Normal pulses.     Heart sounds: Normal heart sounds.  Pulmonary:     Effort: Pulmonary effort is normal.     Breath sounds: Normal breath sounds.  Musculoskeletal:     Cervical back: Neck supple.     Right lower leg: No edema.     Left lower leg: No edema.  Skin:    General: Skin is warm and dry.     Findings: No erythema or rash.  Neurological:     Mental Status: She is alert and oriented to person, place, and time.     Cranial Nerves: No cranial nerve deficit.     Sensory: Sensory deficit present.     Motor: Weakness present.     Comments: Leg and grip weak on left compared to right, reports diminished sensation on left. No facial weakness or asymmetry. Leaning to right sitting in traige chair.  Psychiatric:        Behavior: Behavior normal.     ED Results / Procedures / Treatments   Labs (all labs ordered are listed, but only abnormal results are displayed) Labs Reviewed  COMPREHENSIVE METABOLIC PANEL - Abnormal; Notable for the following components:      Result Value   CO2 21 (*)    Glucose, Bld 115 (*)    All other components within normal limits  GLUCOSE, CAPILLARY - Abnormal; Notable for the following components:   Glucose-Capillary 106 (*)    All other components within normal limits  I-STAT CHEM 8, ED - Abnormal; Notable for the following components:   Glucose, Bld 111 (*)    TCO2 19 (*)    All other  components within normal limits  CBG MONITORING, ED - Abnormal; Notable for the following components:   Glucose-Capillary 102 (*)    All other components within normal limits  SARS CORONAVIRUS 2 BY RT PCR (HOSPITAL ORDER, PERFORMED IN   HOSPITAL LAB)  ETHANOL  PROTIME-INR  APTT  CBC  DIFFERENTIAL  RAPID URINE DRUG SCREEN, HOSP PERFORMED  URINALYSIS, ROUTINE W REFLEX MICROSCOPIC  I-STAT BETA HCG BLOOD, ED ([facility], [facility], [facility] ONLY)    EKG None  Radiology CT Head Wo Contrast  Result Date: 10/29/2019 CLINICAL DATA:  Worsening headache. EXAM: CT HEAD WITHOUT CONTRAST TECHNIQUE: Contiguous axial images were obtained from the base of the skull through the vertex without intravenous contrast. COMPARISON:  October 29, 2019 FINDINGS: Brain: No evidence of acute infarction, hemorrhage, hydrocephalus, extra-axial collection or mass lesion/mass effect. Vascular: No hyperdense vessel or unexpected calcification. Skull: Normal. Negative for fracture or focal lesion. Sinuses/Orbits: No acute finding. Other: None. IMPRESSION: No acute intracranial pathology. Electronically Signed   By: Aram Candela M.D.   On: 10/29/2019 16:55   CT HEAD CODE STROKE WO CONTRAST  Result Date: 10/29/2019 CLINICAL DATA:  Code stroke. Acute onset of blurred vision and left upper extremity and lower extremity numbness 1315 hours. EXAM: CT HEAD WITHOUT CONTRAST TECHNIQUE: Contiguous axial images were obtained from the base of the skull through the vertex without intravenous contrast. COMPARISON:  None. FINDINGS: Brain: Normal appearance without evidence of malformation, atrophy, old or acute infarction, mass lesion, hemorrhage, hydrocephalus or extra-axial collection. Vascular: No abnormal vascular finding. Skull: Normal Sinuses/Orbits: Clear/normal Other: None ASPECTS (Alberta Stroke Program Early CT Score) - Ganglionic level infarction (caudate, lentiform nuclei, internal capsule, insula, M1-M3 cortex): 7 - Supraganglionic  infarction (M4-M6 cortex): 3 Total score (0-10 with 10 being normal): 10 IMPRESSION: 1. Normal head CT 2. ASPECTS is 10 3. These results were called by telephone at the time of interpretation on 10/29/2019 at 3:25 pm to provider Dr. Jodi Mourning, Who verbally acknowledged these results. Electronically Signed   By: Paulina Fusi M.D.   On: 10/29/2019 15:27    Procedures .Critical Care Performed by: Jeannie Fend, PA-C Authorized by: Jeannie Fend, PA-C   Critical care provider statement:    Critical care time (minutes):  45   Critical care was time spent personally by me on the following activities:  Discussions with consultants, evaluation of patient's response to treatment, examination of patient, ordering and performing treatments and interventions, ordering and review of laboratory studies, ordering and review of radiographic studies, pulse oximetry, re-evaluation of patient's condition, obtaining history from patient or surrogate and review of old charts   (including critical care time)  Medications Ordered in ED Medications  alteplase (ACTIVASE) 1 mg/mL infusion 67.7 mg (0 mg Intravenous Stopped 10/29/19 1738)    Followed by  0.9 %  sodium chloride infusion (50 mLs Intravenous New Bag/Given 10/29/19 1738)  metoCLOPramide (REGLAN) injection 10 mg (10 mg Intravenous Given by Other 10/29/19 1702)    ED Course  I have reviewed the triage vital signs and the nursing notes.  Pertinent labs & imaging results that were available during my care of the patient were reviewed by me and considered in my medical decision making (see chart for details).  Clinical Course as of Oct 28 1816  Wed Oct 29, 2019  [id number] 29 year old female with history as documented above, on exam has left-sided facial, arm, leg numbness, diminished grip strength and leg strength compared to right however no left arm drift, no facial asymmetry or facial weakness. Patient appears anxious, is hyperventilating.  Discussed with Dr.  Hyacinth Meeker, ER attending, recommends activate code neuro.  CT head unremarkable, CBC, CMP without significant findings. Hcg negative.  Patient was evaluated by telemetry neuro who has recommended  TPA.  Patient has been seen by Dr. Jodi Mourning, ER attending.  Plan is to consult neuro hospitalist at [facility] to arrange transfer.   [LM]  1615 Dr. Jodi Mourning has contacted mom, Madaline Guthrie to update with plan of care [phone].   [LM]  1636 Patient reports worsening headache, TPA was stopped and patient was taken back to CT.   [LM]  1651 Patient returns from repeat head CT.  Patient states that headache started after her initial evaluation while she was waiting in the hallway for a bed, located on the left side of her head,   [LM]  1651  now spreading across her forehead to both sides head.  Similar to prior migraines and starting to feel nauseous.   [LM]  1656 Case discussed with Dr. Amada Jupiter, neuro hospitalist at [facility] who accepts to service in transfer.  Recommends Reglan 10 mg IV for her migraine, recommends avoiding Benadryl as not to cause sedation in a patient receiving TPA.   [LM]    Clinical Course User Index [LM] Alden Hipp   MDM Rules/Calculators/A&P                      Final Clinical Impression(s) / ED Diagnoses Final diagnoses:  Left-sided weakness  Numbness  Acute intractable headache, unspecified headache type    Rx / DC Orders ED Discharge Orders    None       Jeannie Fend, PA-C 10/29/19 1818    Blane Ohara, MD 10/30/19 0007

## 2019-10-29 NOTE — ED Notes (Signed)
Called [facility] to transfer Pt to [facility]. 

## 2019-10-29 NOTE — Consult Note (Signed)
[facility] Tele[facility]urology Consult Services   Date of Service:   10/29/2019 15:33:39  Impression:       I63.9 - Cerebrovascular accident (CVA), unspecified mechanism (HCC)  Comments/Sign-Out: The patient is a 29 year old woman with no significant past medical history who presents with left sided weakness and numbness concerning for CVA. Risk/benefit of alteplase discussed and patient accepts alteplase. Routine post-alteplase management as detailed below. Recommend evaluation of vascular risk factors with MRI brain, MRA head/neck, TTE, lipid panel, HgbA1C. Tolerate permissive hypertension with BP goal < 185/110. If workup unremarkable, consider stroke mimics such as complex migraine.  Metrics: Last Known Well: 10/29/2019 13:15:00 [facility] Notification Time: 10/29/2019 15:33:39 Arrival Time: 10/29/2019 15:00:01 Stamp Time: 10/29/2019 15:33:39 Time First Login Attempt: 10/29/2019 15:39:34 Symptoms: L sided weakness NIHSS Start Assessment Time: 10/29/2019 15:41:23 Alteplase Early Mix Decision Time: 10/29/2019 15:51:19 Patient is a candidate for Alteplase/Activase. Alteplase Medical Decision: 10/29/2019 15:51:19 Alteplase/Activase CPOE Order Time: 10/29/2019 15:55:17 Needle Time: 10/29/2019 16:03:37 Weight Noted by Staff: 75.2 kg Reason for Alteplase/Activase Delay: Delayed hospital activation of TS Service  CT head was reviewed and results were: 1. Normal head CT 2. ASPECTS is 10  ED Physician notified of diagnostic impression and management plan on 10/29/2019 16:10:02  Advanced Imaging: Advanced Imaging Not Recommended because:  Clinical Presentation is not Suggestive of LVO and NIHSS is <6   Alteplase/Activase Contraindications:  Last Known Well > 4.5 hours: No CT Head showing hemorrhage: No Ischemic stroke within 3 months: No Severe head trauma within 3 months: No Intracranial/intraspinal surgery within 3 months: No History of intracranial  hemorrhage: No Symptoms and signs consistent with an SAH: No GI malignancy or GI bleed within 21 days: No Coagulopathy: Platelets <100 000/mm3, INR >1.7, aPTT>40 s, or PT >15 s: No Treatment dose of LMWH within the previous 24 hrs: No Use of NOACs in past 48 hours: No Glycoprotein IIb/IIIa receptor inhibitors use: No Symptoms consistent with infective endocarditis: No Suspected aortic arch dissection: No Intra-axial intracranial neoplasm: No  Verbal Consent to Alteplase/Activase: I have explained to the Patient the nature of the patients condition, reviewed the indications and contraindications to the use of Alteplase/Activase fibrinolytic agent, reviewed the indications and contraindications and the benefits to be reasonably expected compared with alternative approaches. I have discussed the likelihood of major risks or complications of this procedure including (if applicable) but not limited to loss of limb function, brain damage, paralysis, hemorrhage, infection, complications from transfusion of blood components, drug reactions, blood clots and loss of life. I have also indicated that with any procedure there is always the possibility of an unexpected complication. All questions were answered and Patient express understanding of the treatment plan and consent to the treatment.  Our recommendations are outlined below.  Recommendations: IV Alteplase/Activase recommended.  Alteplase/Activase bolus given Without Complication.   IV Alteplase/Activase Total Dose - 67.7 mg IV Alteplase/Activase Bolus Dose - 6.8 mg IV Alteplase/Activase Infusion Dose - 60.9 mg  Routine post Alteplase/Activase monitoring including neuro checks and blood pressure control during/after treatment Monitor blood pressure Check blood pressure and neuro assessment every 15 min for 2 h, then every 30 min for 6 h, and finally every hour for 16 h.  Manage Blood Pressure per post Alteplase/Activase protocol.         Admission to ICU       CT brain 24 hours post Alteplase/Activase       NPO until swallowing screen performed and passed       No  antiplatelet agents or anticoagulants (including heparin for DVT prophylaxis) in first 24 hours       No Foley catheter, nasogastric tube, arterial catheter or central venous catheter for 24 hr, unless absolutely necessary       Telemetry       Bedside swallow evaluation       HOB less than 30 degrees       Euglycemia       Avoid hyperthermia, PRN acetaminophen       DVT prophylaxis       Inpatient Neurology Consultation       Stroke evaluation as per inpatient neurology recommendations  Discussed with ED physician    ------------------------------------------------------------------------------  History of Present Illness: Patient is a 28 year old Female.  Patient was brought by EMS for symptoms of L sided weakness  Patient developed acute onset left sided weakness and numbness involving the face, arm, and leg. Symptoms started with blurry vision bilaterally and numbness. Also noted to be having weakness of the arm and leg. Reports left sided headache, throbbing, no photophobia/phonophobia, mild nausea, no scintillations. Also reports chest pain and SOB.  Last seen normal was within 4.5 hours. There is no history of hemorrhagic complications or intracranial hemorrhage. There is no history of Recent Anticoagulants. There is no history of recent major surgery. There is no history of recent stroke.  Past Medical History:      There is NO history of Hypertension      There is NO history of Diabetes Mellitus      There is NO history of Hyperlipidemia      There is NO history of Atrial Fibrillation      There is NO history of Coronary Artery Disease      There is NO history of Stroke  Anticoagulant use:  No  Antiplatelet use: No   Examination: BP(140/97), Pulse(84), Blood Glucose(103) 1A: Level of Consciousness - Alert; keenly  responsive + 0 1B: Ask Month and Age - Both Questions Right + 0 1C: Blink Eyes & Squeeze Hands - Performs Both Tasks + 0 2: Test Horizontal Extraocular Movements - Normal + 0 3: Test Visual Fields - No Visual Loss + 0 4: Test Facial Palsy (Use Grimace if Obtunded) - Normal symmetry + 0 5A: Test Left Arm Motor Drift - Drift, but doesn't hit bed + 1 5B: Test Right Arm Motor Drift - No Drift for 10 Seconds + 0 6A: Test Left Leg Motor Drift - Drift, but doesn't hit bed + 1 6B: Test Right Leg Motor Drift - No Drift for 5 Seconds + 0 7: Test Limb Ataxia (FNF/Heel-Shin) - No Ataxia + 0 8: Test Sensation - Mild-Moderate Loss: Less Sharp/More Dull + 1 9: Test Language/Aphasia - Normal; No aphasia + 0 10: Test Dysarthria - Normal + 0 11: Test Extinction/Inattention - No abnormality + 0  NIHSS Score: 3  Pre-Morbid Modified Ranking Scale: 0 Points = No symptoms at all   Patient/Family was informed the Neurology Consult would occur via TeleHealth consult by way of interactive audio and video telecommunications and consented to receiving care in this manner.   Patient is being evaluated for possible acute neurologic impairment and high probability of imminent or life-threatening deterioration. I spent total of 35 minutes providing care to this patient, including time for face to face visit via telemedicine, review of medical records, imaging studies and discussion of findings with providers, the patient and/or family.   Dr Rosanne Ashing   [facility] (  [phone]  Case [id number]

## 2019-10-29 NOTE — ED Notes (Addendum)
During TPA administration pt started c/o "worsening headache". Pt reported that headache was not just on one side but throughout her entire head. TPA infusion paused at 1631. Misty Stanley, tele-stroke RN notified and said to stop TPA infusion now and take pt for stat CT head. Pt taken over to CT. Dr. Jodi Mourning notified.

## 2019-10-29 NOTE — H&P (Signed)
Chief Complaint: left side numbness, weakness and blurry vision   History obtained from: Patient and Chart    HPI:                                                                                                                                       [name] is a 29 y.o. female with past medical history significant for migraines, panic disorder who works at [employer] with left-sided numbness and weakness-code stroke activated and patient received TPA and transferred to [facility] for further evaluation.  History obtained by patient, states that while she was working she suddenly felt off like she was having another panic attack.  She had blurry vision both eyes, then went outside and started to hyperventilate.  At this time she noticed the left face was numb which gradually spread to include the left arm and leg.  She was brought to the emergency department and was evaluated by tele neurologist.  Her NIH stroke scale was 3-score for numbness as well as arm and leg drift.  This is followed by left-sided headache throbbing with no photophobia or phonophobia and mild nausea.  Also complained of chest pain and shortness of breath.  Due to her sudden onset left-sided weakness-it appears TPA was administered after discussing risk versus benefits by tele-neurologist.  Recommended MRI brain and if were negative to consider complex migraine  Patient transferred to [facility] for post TPA management.  On arrival her NIH stroke scale 0.  No longer complaining of headache.  Blood pressure within normal limits.   Date last known well: 6.9.21 Time last known well: 1pm tPA Given: yes NIHSS: 0 Baseline MRS 0   Past Medical History:  Diagnosis Date  . Allergy   . Anxiety   . Anxiety and depression   . Asthma   . Chest pain, unspecified   . Depression   . Dyspnea   . Gluten intolerance   . Hyperventilation   . IBS (irritable bowel syndrome)   . Malaise and fatigue   . Migraine    . Panic attack   . Panic attack   . Pregnancy induced hypertension   . SVD (spontaneous vaginal delivery) 08/31/2014    Past Surgical History:  Procedure Laterality Date  . ADENOIDECTOMY    . TONSILLECTOMY    . WISDOM TOOTH EXTRACTION      Family History  Problem Relation Age of Onset  . Hypertension Mother   . Cancer Mother   . Heart disease Maternal Grandfather    Social History:  reports that she has never smoked. She has never used smokeless tobacco. She reports that she does not drink alcohol and does not use drugs.  Allergies:  Allergies  Allergen Reactions  . Hydrocodone Nausea Only and Other (See Comments)    Sweating/ "hot flashes"  . Sulfa Antibiotics Rash    Medications:  I reviewed home medications   ROS:                                                                                                                                     14 systems reviewed and negative except above    Examination:                                                                                                      General: Appears well-developed . Psych: Affect appropriate to situation Eyes: No scleral injection HENT: No OP obstrucion Head: Normocephalic.  Cardiovascular: Normal rate and regular rhythm.  Respiratory: Effort normal and breath sounds normal to anterior ascultation GI: Soft.  No distension. There is no tenderness.  Skin: WDI    Neurological Examination Mental Status: Alert, oriented, thought content appropriate.  Speech fluent without evidence of aphasia. Able to follow 3 step commands without difficulty. Cranial Nerves: II: Visual fields grossly normal,  III,IV, VI: ptosis not present, extra-ocular motions intact bilaterally, pupils equal, round, reactive to light and accommodation V,VII: smile symmetric, facial light touch sensation  normal bilaterally VIII: hearing normal bilaterally IX,X: uvula rises symmetrically XI: bilateral shoulder shrug XII: midline tongue extension Motor: Right : Upper extremity   5/5    Left:     Upper extremity   5/5  Lower extremity   5/5     Lower extremity   5/5 Tone and bulk:normal tone throughout; no atrophy noted Sensory: Pinprick and light touch intact throughout, bilaterally Deep Tendon Reflexes: 2+ and symmetric throughout Plantars: Right: downgoing   Left: downgoing Cerebellar: normal finger-to-nose, normal rapid alternating movements and normal heel-to-shin test      Lab Results: Basic Metabolic Panel: Recent Labs  Lab 10/29/19 1528 10/29/19 1532  NA 136 139  K 3.9 3.9  CL 106 105  CO2 21*  --   GLUCOSE 115* 111*  BUN 12 11  CREATININE 0.63 0.50  CALCIUM 9.6  --     CBC: Recent Labs  Lab 10/29/19 1528 10/29/19 1532  WBC 7.6  --   NEUTROABS 3.6  --   HGB 13.7 13.3  HCT 39.4 39.0  MCV 89.3  --   PLT 228  --     Coagulation Studies: Recent Labs    10/29/19 1528  LABPROT 12.2  INR 0.9    Imaging: CT Head Wo Contrast  Result Date: 10/29/2019 CLINICAL DATA:  Worsening headache. EXAM: CT HEAD WITHOUT CONTRAST TECHNIQUE: Contiguous axial images were obtained from  the base of the skull through the vertex without intravenous contrast. COMPARISON:  October 29, 2019 FINDINGS: Brain: No evidence of acute infarction, hemorrhage, hydrocephalus, extra-axial collection or mass lesion/mass effect. Vascular: No hyperdense vessel or unexpected calcification. Skull: Normal. Negative for fracture or focal lesion. Sinuses/Orbits: No acute finding. Other: None. IMPRESSION: No acute intracranial pathology. Electronically Signed   By: Virgina Norfolk M.D.   On: 10/29/2019 16:55   CT HEAD CODE STROKE WO CONTRAST  Result Date: 10/29/2019 CLINICAL DATA:  Code stroke. Acute onset of blurred vision and left upper extremity and lower extremity numbness 1315 hours. EXAM: CT HEAD  WITHOUT CONTRAST TECHNIQUE: Contiguous axial images were obtained from the base of the skull through the vertex without intravenous contrast. COMPARISON:  None. FINDINGS: Brain: Normal appearance without evidence of malformation, atrophy, old or acute infarction, mass lesion, hemorrhage, hydrocephalus or extra-axial collection. Vascular: No abnormal vascular finding. Skull: Normal Sinuses/Orbits: Clear/normal Other: None ASPECTS ([place] Stroke Program Early CT Score) - Ganglionic level infarction (caudate, lentiform nuclei, internal capsule, insula, M1-M3 cortex): 7 - Supraganglionic infarction (M4-M6 cortex): 3 Total score (0-10 with 10 being normal): 10 IMPRESSION: 1. Normal head CT 2. ASPECTS is 10 3. These results were called by telephone at the time of interpretation on 10/29/2019 at 3:25 pm to provider Dr. Reather Converse, Who verbally acknowledged these results. Electronically Signed   By: Nelson Chimes M.D.   On: 10/29/2019 15:27     ASSESSMENT AND PLAN   Stroke versus stroke mimic (such as complex migraine/somatoform disorder with panic attack): Favor complex migraine  Recommendations -Continue frequent neurochecks -MRI of brain at 1 PM, if negative no further stroke work-up needed -No antiplatelet therapy until 24 hours -No statin unless MRI confirms acute stroke -Swallow evaluation -Bedrest   CRITICAL CARE Performed by: Lanice Schwab Ashlee Bewley   Total critical care time: 35  minutes  Critical care time was exclusive of separately billable procedures and treating other patients.  Critical care was necessary to treat or prevent imminent or life-threatening deterioration.  Critical care was time spent personally by me on the following activities: development of treatment plan with patient and/or surrogate as well as nursing, discussions with consultants, evaluation of patient's response to treatment, examination of patient, obtaining history from patient or surrogate, ordering and performing  treatments and interventions, ordering and review of laboratory studies, ordering and review of radiographic studies, pulse oximetry and re-evaluation of patient's condition.   Rollyn Scialdone [facility] Pager Number [phone]

## 2019-10-29 NOTE — ED Provider Notes (Signed)
Shared service with APP.  I have personally seen and examined the patient, providing direct face to face care.  Physical exam findings and plan include stroke evaluation including CT scan of the head, wide range of blood work, repeat neuro checks.  Teleneuro evaluating the patient shortly after CT scan.  On my exam patient had no consistent arm drift, no facial droop, no leg weakness, extraocular muscle function intact, finger-nose intact, visual fields intact.  Patient had decreased sensation left leg and left arm compared to right.  Patient is overall well-appearing. Neuro recommends TPA based on left sided symptoms and signs.   Patient having worsening headache, TPA stopped, sent for CT head.  .Critical Care Performed by: Blane Ohara, MD Authorized by: Blane Ohara, MD   Critical care provider statement:    Critical care time (minutes):  35   Critical care start time:  10/29/2019 4:00 PM   Critical care end time:  10/29/2019 4:35 PM   Critical care was necessary to treat or prevent imminent or life-threatening deterioration of the following conditions:  CNS failure or compromise   Critical care was time spent personally by me on the following activities:  Discussions with consultants, evaluation of patient's response to treatment, examination of patient, ordering and performing treatments and interventions, ordering and review of laboratory studies, ordering and review of radiographic studies and re-evaluation of patient's condition    No diagnosis found.     Blane Ohara, MD 10/30/19 [phone]

## 2019-10-29 NOTE — ED Triage Notes (Addendum)
Pt reports approximately 1315, went to register pt and reports vision went blurry and feels intermittent numbness on left upper extremity, left lower leg. Pt reports decreased sensation on left side of face, left arm, and left leg. Pt reports feels like is leaning to the left with ambulation. Speech clear. Facial symmetry noted.    EKG performed. EDP notified. Code Stroke activated. Pt placed in wheelchair and taken to CT. ED Charge aware.   Teleneuro currently in use with stat neuro consult for other ED patient. Teleneuro cart activated at 330, once neuro consult finished.   CBG 103.

## 2019-10-30 ENCOUNTER — Inpatient Hospital Stay (HOSPITAL_COMMUNITY): Payer: BLUE CROSS/BLUE SHIELD

## 2019-10-30 DIAGNOSIS — I6389 Other cerebral infarction: Secondary | ICD-10-CM

## 2019-10-30 DIAGNOSIS — R2990 Unspecified symptoms and signs involving the nervous system: Secondary | ICD-10-CM

## 2019-10-30 DIAGNOSIS — G43909 Migraine, unspecified, not intractable, without status migrainosus: Secondary | ICD-10-CM | POA: Diagnosis present

## 2019-10-30 LAB — RAPID URINE DRUG SCREEN, HOSP PERFORMED
Amphetamines: NOT DETECTED
Barbiturates: NOT DETECTED
Benzodiazepines: NOT DETECTED
Cocaine: NOT DETECTED
Opiates: NOT DETECTED
Tetrahydrocannabinol: NOT DETECTED

## 2019-10-30 LAB — LIPID PANEL
Cholesterol: 180 mg/dL (ref 0–200)
HDL: 70 mg/dL (ref >40)
LDL Cholesterol: 98 mg/dL (ref 0–99)
Total CHOL/HDL Ratio: 2.6 RATIO
Triglycerides: 60 mg/dL (ref <150)
VLDL: 12 mg/dL (ref 0–40)

## 2019-10-30 LAB — ECHOCARDIOGRAM COMPLETE
Height: 64 in
Weight: 2652.57 oz

## 2019-10-30 LAB — HEMOGLOBIN A1C
Hgb A1c MFr Bld: 5 % (ref 4.8–5.6)
Mean Plasma Glucose: 96.8 mg/dL

## 2019-10-30 LAB — MRSA PCR SCREENING: MRSA by PCR: NEGATIVE

## 2019-10-30 LAB — HIV ANTIBODY (ROUTINE TESTING W REFLEX): HIV Screen 4th Generation wRfx: NONREACTIVE

## 2019-10-30 MED ORDER — LORAZEPAM 2 MG/ML IJ SOLN
1.0000 mg | Freq: Once | INTRAMUSCULAR | Status: AC
  Administered 2019-10-30: 1 mg via INTRAVENOUS
  Filled 2019-10-30: qty 1

## 2019-10-30 MED ORDER — CITALOPRAM HYDROBROMIDE 10 MG PO TABS: 10.0000 mg | ORAL_TABLET | Freq: Every day | ORAL | 2 refills | Status: DC

## 2019-10-30 MED ORDER — CITALOPRAM HYDROBROMIDE 10 MG PO TABS
10.0000 mg | ORAL_TABLET | Freq: Every day | ORAL | Status: DC
  Administered 2019-10-30: 10 mg via ORAL
  Filled 2019-10-30: qty 1

## 2019-10-30 MED ORDER — METOCLOPRAMIDE HCL 5 MG/5ML PO SOLN
10.0000 mg | Freq: Three times a day (TID) | ORAL | Status: DC | PRN
  Administered 2019-10-30: 10 mg via ORAL
  Filled 2019-10-30 (×2): qty 10

## 2019-10-30 NOTE — Discharge Summary (Addendum)
Stroke Discharge Summary  Patient ID: [name]   MRN: [id number]      DOB: 21-Jan-1991  Date of Admission: 10/29/2019 Date of Discharge: 10/30/2019  Attending Physician:  Micki Riley, MD, Stroke MD Consultant(s):    None  Patient's PCP:  System, Provider Not In  DISCHARGE DIAGNOSIS:  Principal Problem:   Stroke-like episode s/p tPA  Active Problems:   PANIC ATTACK   ANXIETY DEPRESSION   Migraines   Allergies as of 10/30/2019      Reactions   Hydrocodone Nausea Only, Other (See Comments)   Sweating/ "hot flashes"   Sulfa Antibiotics Rash      Medication List    TAKE these medications   citalopram 10 MG tablet Commonly known as: CELEXA Take 1 tablet (10 mg total) by mouth daily. Start taking on: October 31, 2019   etonogestrel-ethinyl estradiol 0.12-0.015 MG/24HR vaginal ring Commonly known as: NUVARING Place 1 each vaginally every 28 (twenty-eight) days. Insert vaginally and leave in place for 3 consecutive weeks, then remove for 1 week.   ferrous sulfate 325 (65 FE) MG tablet Take 325 mg by mouth daily with breakfast.   MAGNESIUM PO Take 1 tablet by mouth daily.   PROBIOTIC ADVANCED PO Take 1 capsule by mouth daily.       LABORATORY STUDIES CBC    Component Value Date/Time   WBC 7.6 10/29/2019 1528   RBC 4.41 10/29/2019 1528   HGB 13.3 10/29/2019 1532   HGB 12.9 02/21/2014 1806   HCT 39.0 10/29/2019 1532   HCT 37.4 02/21/2014 1806   PLT 228 10/29/2019 1528   PLT 171 02/21/2014 1806   MCV 89.3 10/29/2019 1528   MCV 90 02/21/2014 1806   MCH 31.1 10/29/2019 1528   MCHC 34.8 10/29/2019 1528   RDW 11.9 10/29/2019 1528   RDW 12.5 02/21/2014 1806   LYMPHSABS 3.4 10/29/2019 1528   LYMPHSABS 1.4 02/21/2014 1806   MONOABS 0.5 10/29/2019 1528   MONOABS 0.5 02/21/2014 1806   EOSABS 0.1 10/29/2019 1528   EOSABS 0.0 02/21/2014 1806   BASOSABS 0.0 10/29/2019 1528   BASOSABS 0.0 02/21/2014 1806   CMP    Component Value Date/Time   NA 139  10/29/2019 1532   NA 142 02/21/2014 1806   K 3.9 10/29/2019 1532   K 3.3 (L) 02/21/2014 1806   CL 105 10/29/2019 1532   CL 113 (H) 02/21/2014 1806   CO2 21 (L) 10/29/2019 1528   CO2 21 02/21/2014 1806   GLUCOSE 111 (H) 10/29/2019 1532   GLUCOSE 74 02/21/2014 1806   BUN 11 10/29/2019 1532   BUN 6 (L) 02/21/2014 1806   CREATININE 0.50 10/29/2019 1532   CREATININE 0.46 (L) 02/21/2014 1806   CALCIUM 9.6 10/29/2019 1528   CALCIUM 7.7 (L) 02/21/2014 1806   PROT 7.7 10/29/2019 1528   PROT 7.8 01/23/2014 1502   ALBUMIN 4.2 10/29/2019 1528   ALBUMIN 3.9 01/23/2014 1502   AST 18 10/29/2019 1528   AST 15 01/23/2014 1502   ALT 16 10/29/2019 1528   ALT 14 01/23/2014 1502   ALKPHOS 66 10/29/2019 1528   ALKPHOS 82 01/23/2014 1502   BILITOT 0.4 10/29/2019 1528   BILITOT 0.8 01/23/2014 1502   GFRNONAA >60 10/29/2019 1528   GFRNONAA >60 02/21/2014 1806   GFRNONAA >60 01/23/2014 1502   GFRAA >60 10/29/2019 1528   GFRAA >60 02/21/2014 1806   GFRAA >60 01/23/2014 1502   COAGS Lab Results  Component Value Date  INR 0.9 10/29/2019   Lipid Panel    Component Value Date/Time   CHOL 180 10/30/2019 0731   TRIG 60 10/30/2019 0731   HDL 70 10/30/2019 0731   CHOLHDL 2.6 10/30/2019 0731   VLDL 12 10/30/2019 0731   LDLCALC 98 10/30/2019 0731   HgbA1C  Lab Results  Component Value Date   HGBA1C 5.0 10/30/2019   Urinalysis    Component Value Date/Time   COLORURINE YELLOW 12/29/2016 2113   APPEARANCEUR HAZY (A) 12/29/2016 2113   APPEARANCEUR Cloudy 02/21/2014 1806   LABSPEC 1.019 12/29/2016 2113   LABSPEC 1.009 02/21/2014 1806   PHURINE 5.0 12/29/2016 2113   GLUCOSEU NEGATIVE 12/29/2016 2113   GLUCOSEU Negative 02/21/2014 1806   HGBUR NEGATIVE 12/29/2016 2113   BILIRUBINUR NEGATIVE 12/29/2016 2113   BILIRUBINUR Negative 02/21/2014 1806   KETONESUR NEGATIVE 12/29/2016 2113   PROTEINUR NEGATIVE 12/29/2016 2113   UROBILINOGEN 0.2 08/23/2014 2124   NITRITE NEGATIVE 12/29/2016 2113    LEUKOCYTESUR TRACE (A) 12/29/2016 2113   LEUKOCYTESUR Negative 02/21/2014 1806   Urine Drug Screen     Component Value Date/Time   LABOPIA NONE DETECTED 10/30/2019 0904   COCAINSCRNUR NONE DETECTED 10/30/2019 0904   LABBENZ NONE DETECTED 10/30/2019 0904   AMPHETMU NONE DETECTED 10/30/2019 0904   THCU NONE DETECTED 10/30/2019 0904   LABBARB NONE DETECTED 10/30/2019 0904    Alcohol Level    Component Value Date/Time   ETH <10 10/29/2019 1528   SIGNIFICANT DIAGNOSTIC STUDIES CT Head Wo Contrast  Result Date: 10/29/2019 CLINICAL DATA:  Worsening headache. EXAM: CT HEAD WITHOUT CONTRAST TECHNIQUE: Contiguous axial images were obtained from the base of the skull through the vertex without intravenous contrast. COMPARISON:  October 29, 2019 FINDINGS: Brain: No evidence of acute infarction, hemorrhage, hydrocephalus, extra-axial collection or mass lesion/mass effect. Vascular: No hyperdense vessel or unexpected calcification. Skull: Normal. Negative for fracture or focal lesion. Sinuses/Orbits: No acute finding. Other: None. IMPRESSION: No acute intracranial pathology. Electronically Signed   By: Aram Candela M.D.   On: 10/29/2019 16:55   MR ANGIO HEAD WO CONTRAST  Result Date: 10/30/2019 CLINICAL DATA:  Stroke, follow-up. Additional history provided: Status post tPA, left-sided numbness, weakness and blurry vision. EXAM: MRI HEAD WITHOUT CONTRAST MRA HEAD WITHOUT CONTRAST TECHNIQUE: Multiplanar, multiecho pulse sequences of the brain and surrounding structures were obtained without intravenous contrast. Angiographic images of the head were obtained using MRA technique without contrast. COMPARISON:  Non-contrast head CT examinations 10/29/2019. FINDINGS: MRI HEAD FINDINGS Brain: No focal parenchymal signal abnormality is identified. There is no acute infarct. No evidence of intracranial mass. No chronic intracranial blood products. No extra-axial fluid collection. No midline shift. Vascular:  Reported below. Skull and upper cervical spine: There is generalized T1 hypointense marrow signal within the calvarium and visualized upper cervical spine. There are multiple small T1/T2 hyperintense foci within the bilateral calvarium which may reflect hemangiomas. Sinuses/Orbits: Visualized orbits show no acute finding. Bilateral maxillary sinus mucous retention cysts. Background mild paranasal sinus mucosal thickening. No significant mastoid effusion. MRA HEAD FINDINGS The intracranial internal carotid arteries are patent without significant stenosis. The M1 middle cerebral arteries are patent without significant stenosis. No M2 proximal branch occlusion or high-grade proximal stenosis is identified. Markedly hypoplastic A1 right ACA. The anterior cerebral arteries are patent without significant proximal stenosis. The non dominant intracranial right vertebral artery terminates as the right PICA and this vessel is patent. The dominant intracranial left vertebral artery is patent without significant stenosis, as is the basilar artery.  Predominantly fetal origin of the right posterior cerebral artery. The posterior cerebral arteries are proximally without significant stenosis. A small left posterior communicating artery is present. No intracranial aneurysm is identified. IMPRESSION: MRI head: 1. No evidence of acute intracranial abnormality, including acute infarction. 2. Unremarkable MRI appearance of the brain. 3. T1 hypointense marrow signal within the calvarium and visualized upper cervical spine. While this finding may be seen in the setting of a marrow infiltrative process, the most common causes are chronic anemia, smoking and obesity. 4. Bilateral maxillary sinus mucous retention cysts. Background mild paranasal sinus mucosal thickening. MRA head: 1. No intracranial large vessel occlusion or proximal high-grade arterial stenosis. 2. 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 3.  Predominantly fetal origin right posterior cerebral artery. Electronically Signed   By: Jackey Loge DO   On: 10/30/2019 15:08   MR BRAIN WO CONTRAST  Result Date: 10/30/2019 CLINICAL DATA:  Stroke, follow-up. Additional history provided: Status post tPA, left-sided numbness, weakness and blurry vision. EXAM: MRI HEAD WITHOUT CONTRAST MRA HEAD WITHOUT CONTRAST TECHNIQUE: Multiplanar, multiecho pulse sequences of the brain and surrounding structures were obtained without intravenous contrast. Angiographic images of the head were obtained using MRA technique without contrast. COMPARISON:  Non-contrast head CT examinations 10/29/2019. FINDINGS: MRI HEAD FINDINGS Brain: No focal parenchymal signal abnormality is identified. There is no acute infarct. No evidence of intracranial mass. No chronic intracranial blood products. No extra-axial fluid collection. No midline shift. Vascular: Reported below. Skull and upper cervical spine: There is generalized T1 hypointense marrow signal within the calvarium and visualized upper cervical spine. There are multiple small T1/T2 hyperintense foci within the bilateral calvarium which may reflect hemangiomas. Sinuses/Orbits: Visualized orbits show no acute finding. Bilateral maxillary sinus mucous retention cysts. Background mild paranasal sinus mucosal thickening. No significant mastoid effusion. MRA HEAD FINDINGS The intracranial internal carotid arteries are patent without significant stenosis. The M1 middle cerebral arteries are patent without significant stenosis. No M2 proximal branch occlusion or high-grade proximal stenosis is identified. Markedly hypoplastic A1 right ACA. The anterior cerebral arteries are patent without significant proximal stenosis. The non dominant intracranial right vertebral artery terminates as the right PICA and this vessel is patent. The dominant intracranial left vertebral artery is patent without significant stenosis, as is the basilar artery.  Predominantly fetal origin of the right posterior cerebral artery. The posterior cerebral arteries are proximally without significant stenosis. A small left posterior communicating artery is present. No intracranial aneurysm is identified. IMPRESSION: MRI head: 1. No evidence of acute intracranial abnormality, including acute infarction. 2. Unremarkable MRI appearance of the brain. 3. T1 hypointense marrow signal within the calvarium and visualized upper cervical spine. While this finding may be seen in the setting of a marrow infiltrative process, the most common causes are chronic anemia, smoking and obesity. 4. Bilateral maxillary sinus mucous retention cysts. Background mild paranasal sinus mucosal thickening. MRA head: 1. No intracranial large vessel occlusion or proximal high-grade arterial stenosis. 2. 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 3. Predominantly fetal origin right posterior cerebral artery. Electronically Signed   By: Jackey Loge DO   On: 10/30/2019 15:08   ECHOCARDIOGRAM COMPLETE  Result Date: 10/30/2019    ECHOCARDIOGRAM REPORT   Patient Name:   [name] Date of Exam: 10/30/2019 Medical Rec #:  [id number]       Height:       64.0 in Accession #:    [id number]      Weight:  165.8 lb Date of Birth:  1991-05-14      BSA:          1.806 m Patient Age:    28 years        BP:           107/80 mmHg Patient Gender: F               HR:           93 bpm. Exam Location:  Inpatient Procedure: 2D Echo Indications:    Stroke I163.9  History:        Patient has no prior history of Echocardiogram examinations.  Sonographer:    Thurman Coyer RDCS (AE) Referring Phys: [id number] Dara Lords AROOR IMPRESSIONS  1. Left ventricular ejection fraction, by estimation, is 60 to 65%. The left ventricle has normal function. The left ventricle has no regional wall motion abnormalities. Left ventricular diastolic parameters were normal.  2. Right ventricular systolic function is  normal. The right ventricular size is normal. There is normal pulmonary artery systolic pressure.  3. The mitral valve is normal in structure. Trivial mitral valve regurgitation. No evidence of mitral stenosis.  4. The aortic valve is tricuspid. Aortic valve regurgitation is not visualized. No aortic stenosis is present.  5. The inferior vena cava is normal in size with greater than 50% respiratory variability, suggesting right atrial pressure of 3 mmHg. FINDINGS  Left Ventricle: Left ventricular ejection fraction, by estimation, is 60 to 65%. The left ventricle has normal function. The left ventricle has no regional wall motion abnormalities. The left ventricular internal cavity size was normal in size. There is  no left ventricular hypertrophy. Left ventricular diastolic parameters were normal. Normal left ventricular filling pressure. Right Ventricle: The right ventricular size is normal. No increase in right ventricular wall thickness. Right ventricular systolic function is normal. There is normal pulmonary artery systolic pressure. The tricuspid regurgitant velocity is 2.17 m/s, and  with an assumed right atrial pressure of 3 mmHg, the estimated right ventricular systolic pressure is 21.8 mmHg. Left Atrium: Left atrial size was normal in size. Right Atrium: Right atrial size was normal in size. Pericardium: There is no evidence of pericardial effusion. Mitral Valve: The mitral valve is normal in structure. Normal mobility of the mitral valve leaflets. Trivial mitral valve regurgitation. No evidence of mitral valve stenosis. Tricuspid Valve: The tricuspid valve is normal in structure. Tricuspid valve regurgitation is trivial. No evidence of tricuspid stenosis. Aortic Valve: The aortic valve is tricuspid. Aortic valve regurgitation is not visualized. No aortic stenosis is present. Pulmonic Valve: The pulmonic valve was normal in structure. Pulmonic valve regurgitation is not visualized. No evidence of pulmonic  stenosis. Aorta: The aortic root is normal in size and structure. Venous: The inferior vena cava is normal in size with greater than 50% respiratory variability, suggesting right atrial pressure of 3 mmHg. IAS/Shunts: No atrial level shunt detected by color flow Doppler.  LEFT VENTRICLE PLAX 2D LVIDd:         4.20 cm  Diastology LVIDs:         2.80 cm  LV e' lateral:   15.60 cm/s LV PW:         0.90 cm  LV E/e' lateral: 6.4 LV IVS:        0.80 cm  LV e' medial:    11.30 cm/s LVOT diam:     2.00 cm  LV E/e' medial:  8.8 LV SV:  86 LV SV Index:   48 LVOT Area:     3.14 cm  RIGHT VENTRICLE RV Basal diam:  2.70 cm RV S prime:     13.30 cm/s TAPSE (M-mode): 3.0 cm LEFT ATRIUM             Index       RIGHT ATRIUM           Index LA diam:        3.00 cm 1.66 cm/m  RA Area:     11.60 cm LA Vol (A2C):   30.2 ml 16.72 ml/m RA Volume:   24.60 ml  13.62 ml/m LA Vol (A4C):   25.6 ml 14.17 ml/m LA Biplane Vol: 28.2 ml 15.61 ml/m  AORTIC VALVE LVOT Vmax:   134.00 cm/s LVOT Vmean:  91.100 cm/s LVOT VTI:    0.274 m  AORTA Ao Root diam: 2.60 cm MITRAL VALVE               TRICUSPID VALVE MV Area (PHT): 6.17 cm    TR Peak grad:   18.8 mmHg MV Decel Time: 123 msec    TR Vmax:        217.00 cm/s MV E velocity: 99.50 cm/s MV A velocity: 96.30 cm/s  SHUNTS MV E/A ratio:  1.03        Systemic VTI:  0.27 m                            Systemic Diam: 2.00 cm Chilton Siiffany [place] MD Electronically signed by Chilton Siiffany [place] MD Signature Date/Time: 10/30/2019/12:40:53 PM    Final    CT HEAD CODE STROKE WO CONTRAST  Result Date: 10/29/2019 CLINICAL DATA:  Code stroke. Acute onset of blurred vision and left upper extremity and lower extremity numbness 1315 hours. EXAM: CT HEAD WITHOUT CONTRAST TECHNIQUE: Contiguous axial images were obtained from the base of the skull through the vertex without intravenous contrast. COMPARISON:  None. FINDINGS: Brain: Normal appearance without evidence of malformation, atrophy, old or acute infarction,  mass lesion, hemorrhage, hydrocephalus or extra-axial collection. Vascular: No abnormal vascular finding. Skull: Normal Sinuses/Orbits: Clear/normal Other: None ASPECTS (Alberta Stroke Program Early CT Score) - Ganglionic level infarction (caudate, lentiform nuclei, internal capsule, insula, M1-M3 cortex): 7 - Supraganglionic infarction (M4-M6 cortex): 3 Total score (0-10 with 10 being normal): 10 IMPRESSION: 1. Normal head CT 2. ASPECTS is 10 3. These results were called by telephone at the time of interpretation on 10/29/2019 at 3:25 pm to provider Dr. Jodi MourningZavitz, Who verbally acknowledged these results. Electronically Signed   By: Paulina FusiMark  Shogry M.D.   On: 10/29/2019 15:27   VAS US CAROTID  Result Date: 10/30/2019 Carotid Arterial Duplex Study Indications:       Left sided numbness and weakness. Other Factors:     Migraines. Comparison Study:  No prior Performing Technologist: Marilynne Halstedita Sturdivant RDMS, RVT  Examination Guidelines: A complete evaluation includes B-mode imaging, spectral Doppler, color Doppler, and power Doppler as needed of all accessible portions of each vessel. Bilateral testing is considered an integral part of a complete examination. Limited examinations for reoccurring indications may be performed as noted.  Right Carotid Findings: +----------+--------+--------+--------+------------------+--------+           PSV cm/sEDV cm/sStenosisPlaque DescriptionComments +----------+--------+--------+--------+------------------+--------+ CCA Prox  82      23                                         +----------+--------+--------+--------+------------------+--------+  CCA Distal68      22                                         +----------+--------+--------+--------+------------------+--------+ ICA Prox  62      29                                         +----------+--------+--------+--------+------------------+--------+ ICA Distal76      42                                          +----------+--------+--------+--------+------------------+--------+ ECA       92      22                                         +----------+--------+--------+--------+------------------+--------+ +----------+--------+-------+----------------+-------------------+           PSV cm/sEDV cmsDescribe        Arm Pressure (mmHG) +----------+--------+-------+----------------+-------------------+ Subclavian               Multiphasic, WNL                    +----------+--------+-------+----------------+-------------------+ +---------+--------+--+--------+--+---------+ VertebralPSV cm/s51EDV cm/s17Antegrade +---------+--------+--+--------+--+---------+  Left Carotid Findings: +----------+--------+--------+--------+------------------+--------+           PSV cm/sEDV cm/sStenosisPlaque DescriptionComments +----------+--------+--------+--------+------------------+--------+ CCA Prox  91      23                                         +----------+--------+--------+--------+------------------+--------+ CCA Distal99      36                                         +----------+--------+--------+--------+------------------+--------+ ICA Prox  67      25                                         +----------+--------+--------+--------+------------------+--------+ ICA Distal103     49                                         +----------+--------+--------+--------+------------------+--------+ ECA       72      20                                         +----------+--------+--------+--------+------------------+--------+ +----------+--------+--------+----------------+-------------------+           PSV cm/sEDV cm/sDescribe        Arm Pressure (mmHG) +----------+--------+--------+----------------+-------------------+ [id number]              Multiphasic, WNL                    +----------+--------+--------+----------------+-------------------+  +---------+--------+--+--------+--+---------+  VertebralPSV cm/s56EDV cm/s24Antegrade +---------+--------+--+--------+--+---------+   Summary: Right Carotid: The extracranial vessels were near-normal with only minimal wall                thickening or plaque. Left Carotid: The extracranial vessels were near-normal with only minimal wall               thickening or plaque. Vertebrals:  Bilateral vertebral arteries demonstrate antegrade flow. Subclavians: Normal flow hemodynamics were seen in bilateral subclavian              arteries. *See table(s) above for measurements and observations.     Preliminary       HISTORY OF PRESENT ILLNESS [name] is a 29 y.o. female with past medical history significant for migraines, panic disorder who works as a Engineer, water at [employer] who presented with left-sided numbness and weakness-code stroke activated and patient received TPA and transferred to [facility] for further evaluation.  History obtained by patient, states that while she was working she suddenly felt off like she was having another panic attack.  She had blurry vision both eyes, then went outside and started to hyperventilate.  At this time she noticed the left face was numb which gradually spread to include the left arm and leg.  She was brought to the emergency department and was evaluated by tele neurologist.  Her NIH stroke scale was 3-score for numbness as well as arm and leg drift.  This is followed by left-sided headache throbbing with no photophobia or phonophobia and mild nausea.  Also complained of chest pain and shortness of breath. Due to her sudden onset left-sided weakness-it appears TPA was administered after discussing risk versus benefits by tele-neurologist.  Recommended MRI brain and if were negative to consider complex migraine. LKW 10/29/19 at 1300.  Patient transferred to [facility] for post TPA management.  On arrival her NIH stroke scale 0.  No longer complaining  of headache.  Blood pressure within normal limits. NIHSS: 0, Baseline MRS 0   HOSPITAL COURSE [name] is a 29 y.o. female with history of migraines, pani disorder presenting to [facility] ED with blurry vision and L arm and leg numbness, leaning to the R. Received tPA 10/29/2019 at 1603. Transferred to [facility] for post tPA care. Admitted to the neuro ICU.  Stroke-like episode s/p tPA Panic Attack  Code Stroke CT head No acute abnormality. ASPECTS 10.     CT head repeat no acute abnormality  MRI  no acute abnormality. Sinus dz  MRA  hypoplastic R A1, fetal R PCA, o/w unremarkable   Carotid Doppler  B ICA 1-39% stenosis, VAs antegrade   2D Echo EF 60-65%. No source of embolus   LDL 98 - no statin given no stroke dx. At goal < 100.  HgbA1c 5.0  SCDs for VTE prophylaxis  No antithrombotic prior to admission, now on No antithrombotic as within 24h of tPA administration.    Therapy recommendations:  pending - ok to be OOB  Disposition:  Return home  Other Stroke Risk Factors  Overweight, Body mass index is 28.46 kg/m., recommend weight loss, diet and exercise as appropriate   Migraines - increasing frequency, heighted during menstrual cycle and 3-4 more times during the month. Takes excedrin. Needs PCP follow up. Neuro available as needed.   On Nuvaring   Other Active Problems  Panic disorder, increasing anxiety. Up to 3 panic attacks per day. Add celexa  10 mg daily. Need follow up with PCP.  DISCHARGE EXAM Blood pressure 121/86, pulse 98, temperature 98.8 F (37.1 C), temperature source Oral, resp. rate 17, height  (1.626 m), weight 75.2 kg, last menstrual period 10/15/2019, SpO2 98 %, not currently breastfeeding. Patient alert and oriented x 3. Speech clear. No aphasia. No dysarthria. Extraoccular movements intact. Visual fields full. Face symmetric. Tongue midline. Moves all extremities x 4. Strength normal.  Coordination normal. Sensation intact. Heart rate regular. Breath sounds clear.   Discharge Diet   Regular thin liquids  DISCHARGE PLAN  Disposition:  Return home  No indication for antithrombotics  New celexa for anxiety  Follow-up PCP in 2 weeks.  Neuro follow up not indicated  32 minutes were spent preparing discharge.  Peggy Main, MSN, APRN, ANVP-BC, AGPCNP-BC Advanced Practice Stroke Nurse [facility] See Amion for Schedule & Pager information 10/30/2019 5:15 PM   I have personally obtained history,examined this patient, reviewed notes, independently viewed imaging studies, participated in medical decision making and plan of care.ROS completed by me personally and pertinent positives fully documented  I have made any additions or clarifications directly to the above note. Agree with note above.    Delia Heady, MD Medical Director [facility] Pager: [phone] 10/30/2019 6:13 PM

## 2019-10-30 NOTE — Evaluation (Signed)
Physical Therapy Evaluation Patient Details Name: [name] MRN: [id number] DOB: 1990-08-24 Today's Date: 10/30/2019   History of Present Illness  29 y.o. female with past medical history significant for migraines, panic disorder who works at [employer] with left-sided numbness and weakness-code. Pt received tPA and was transferred to [facility]. Pt reports feeling as if she was having a panic attack with blurred vision, hyperventilation, L numbness, and HA. Pt also reported to have L weakness. MRI pending   Clinical Impression  Pt presents to PT with deficits in balance and reports dizziness with head turns and with changes in position. Pt does not require physical assistance to correct any present balance/gait deviations, which are minimal during session. Pt reports feeling "swimmyheaded" for most of session with difficulty focusing, reporting some nausea with R VOR head thrust and reporting the room spinning with Dix-Hallpike to the right. Pt does not demonstrate any nystagmus during session. Pt reports a history of this dizziness for multiple years, affecting her when driving, turning quickly, and when getting out of bed. Pt will benefit from continued acute PT services to provide further vestibular assessment and intervention as needed. PT currently recommends outpatient PT referral for vestibular rehab. Pt would prefer a provider within the [facility] network.    Follow Up Recommendations Outpatient PT (outpatient vestibular rehab)    Equipment Recommendations  None recommended by PT    Recommendations for Other Services       Precautions / Restrictions Precautions Precautions: None Restrictions Weight Bearing Restrictions: No      Mobility  Bed Mobility Overal bed mobility: Independent                Transfers Overall transfer level: Independent                  Ambulation/Gait Ambulation/Gait assistance: Independent Gait Distance (Feet): 400  Feet Assistive device: None Gait Pattern/deviations: WFL(Within Functional Limits) Gait velocity: functional Gait velocity interpretation: >2.62 ft/sec, indicative of community ambulatory General Gait Details: pt able to perform head turns, change gait speed, stop abruptly, step over and around objects independently  Stairs            Wheelchair Mobility    Modified Rankin (Stroke Patients Only) Modified Rankin (Stroke Patients Only) Pre-Morbid Rankin Score: No symptoms Modified Rankin: No significant disability     Balance Overall balance assessment: Mild deficits observed, not formally tested                               Standardized Balance Assessment Standardized Balance Assessment : Dynamic Gait Index   Dynamic Gait Index Level Surface: Normal Change in Gait Speed: Normal Gait with Horizontal Head Turns: Mild Impairment Gait with Vertical Head Turns: Mild Impairment Gait and Pivot Turn: Mild Impairment Step Over Obstacle: Normal Step Around Obstacles: Normal       Pertinent Vitals/Pain Pain Assessment: No/denies pain    Home Living Family/patient expects to be discharged to:: Private residence Living Arrangements: Spouse/significant other;Children Available Help at Discharge: Family;Available PRN/intermittently Type of Home: House Home Access: Stairs to enter Entrance Stairs-Rails: None Entrance Stairs-Number of Steps: 3 Home Layout: One level Home Equipment: None Additional Comments: lives with fiance' and 5 y.o. 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 [employer]  Dominance   Dominant Hand: Right    Extremity/Trunk Assessment   Upper Extremity Assessment Upper Extremity Assessment: Overall WFL for tasks assessed    Lower Extremity Assessment Lower Extremity Assessment: Overall WFL for tasks assessed    Cervical / Trunk Assessment Cervical /  Trunk Assessment: Normal  Communication   Communication: No difficulties  Cognition Arousal/Alertness: Awake/alert Behavior During Therapy: WFL for tasks assessed/performed Overall Cognitive Status: Within Functional Limits for tasks assessed                                 General Comments: able to perform serial 2's to and from 100 while ambulating without difficulty.  Able to follow multi step commands       General Comments General comments (skin integrity, edema, etc.): VSS on RA. Vestibular assessment: pursuits, saccades, vertical and horizontal VOR all negative for nystagmus. Pt does report feeling swimmyheaded with difficulty focusing. Pt positive for nausea with VOR head thrust to Right. Dix hallpike to R side dose produce dizziness "room spinning".    Exercises     Assessment/Plan    PT Assessment Patient needs continued PT services  PT Problem List Decreased balance       PT Treatment Interventions Gait training;Stair training;Functional mobility training;Neuromuscular re-education;Balance training;Other (comment) (vestibular rehab)    PT Goals (Current goals can be found in the Care Plan section)  Acute Rehab PT Goals Patient Stated Goal: To stop dizziness with mobility PT Goal Formulation: With patient Time For Goal Achievement: 11/13/19 Potential to Achieve Goals: Good Additional Goals Additional Goal #1: Pt will perform vestibular rehab exercise program independently    Frequency Min 4X/week   Barriers to discharge        Co-evaluation               AM-PAC PT "6 Clicks" Mobility  Outcome Measure Help needed turning from your back to your side while in a flat bed without using bedrails?: None Help needed moving from lying on your back to sitting on the side of a flat bed without using bedrails?: None Help needed moving to and from a bed to a chair (including a wheelchair)?: None Help needed standing up from a chair using your arms  (e.g., wheelchair or bedside chair)?: None Help needed to walk in hospital room?: None Help needed climbing 3-5 steps with a railing? : A Little 6 Click Score: 23    End of Session Equipment Utilized During Treatment: Gait belt Activity Tolerance: Patient tolerated treatment well Patient left: in bed;with call bell/phone within reach Nurse Communication: Mobility status PT Visit Diagnosis: Other symptoms and signs involving the nervous system (R29.898) (possbile BPPV)    Time: 3382-5053 PT Time Calculation (min) (ACUTE ONLY): 23 min   Charges:   PT Evaluation $PT Eval Moderate Complexity: 1 Mod          Zenaida Niece, PT, DPT Acute Rehabilitation Pager: [phone]   Zenaida Niece 10/30/2019, 12:30 PM

## 2019-10-30 NOTE — Progress Notes (Signed)
°  Echocardiogram 2D Echocardiogram has been performed.  Pieter Partridge 10/30/2019, 10:35 AM

## 2019-10-30 NOTE — Progress Notes (Signed)
Pt c/o mild migraine with associated nausea. Pt states this is common for her.  Notified [facility]. Order for reglan placed.

## 2019-10-30 NOTE — Evaluation (Signed)
Occupational Therapy Evaluation Patient Details Name: [name] MRN: [id number] DOB: 1991/02/01 Today's Date: 10/30/2019    History of Present Illness 29 y.o. female with past medical history significant for migraines, panic disorder who works at [employer] with left-sided numbness and weakness-code. Pt received tPA and was transferred to [facility]. Pt reports feeling as if she was having a panic attack with blurred vision, hyperventilation, L numbness, and HA. Pt also reported to have L weakness. MRI pending    Clinical Impression   Pt admitted with the above, and demonstrates the below listed deficits.  She is able to perform ADLs mod I, but is at time guarded and moves a bit slowly due to dizziness.  She reports she has a h/o vertigo. She lives with her fiance' and 13 y.o. son, and was fully independent PTA, including driving.  No obvious cognition or visual deficits noted.    No further OT recommended, but feel she would benefit from OPPT for vestibular rehab.  OT will sign off.     Follow Up Recommendations  Other (comment) (OP PT vestibular )    Equipment Recommendations  None recommended by OT    Recommendations for Other Services       Precautions / Restrictions Precautions Precautions: None Restrictions Weight Bearing Restrictions: No      Mobility Bed Mobility Overal bed mobility: Independent                Transfers Overall transfer level: Independent                    Balance Overall balance assessment: Mild deficits observed, not formally tested                               Standardized Balance Assessment Standardized Balance Assessment : Dynamic Gait Index   Dynamic Gait Index Level Surface: Normal Change in Gait Speed: Normal Gait with Horizontal Head Turns: Mild Impairment Gait with Vertical Head Turns: Mild Impairment Gait and Pivot Turn: Mild Impairment Step Over Obstacle: Normal Step Around Obstacles:  Normal     ADL either performed or assessed with clinical judgement   ADL Overall ADL's : Modified independent                                             Vision Baseline Vision/History: Wears glasses Wears Glasses: At all times Patient Visual Report: No change from baseline Vision Assessment?: Yes Eye Alignment: Within Functional Limits Ocular Range of Motion: Within Functional Limits Alignment/Gaze Preference: Within Defined Limits Tracking/Visual Pursuits: Able to track stimulus in all quads without difficulty Saccades: Within functional limits Visual Fields: No apparent deficits Additional Comments: Pt does report h/o vertigo that has been present x 5 years with onset after a fall and striking her head.  Was able to elicit dizziness with head turns.  No nystagmus noted      Perception Perception Perception Tested?: Yes   Praxis      Pertinent Vitals/Pain Pain Assessment: No/denies pain     Hand Dominance Right   Extremity/Trunk Assessment Upper Extremity Assessment Upper Extremity Assessment: Overall WFL for tasks assessed   Lower Extremity Assessment Lower Extremity Assessment: Overall WFL for tasks assessed   Cervical / Trunk Assessment Cervical / Trunk Assessment: Normal   Communication Communication Communication:  No difficulties   Cognition Arousal/Alertness: Awake/alert Behavior During Therapy: WFL for tasks assessed/performed Overall Cognitive Status: Within Functional Limits for tasks assessed                                 General Comments: able to perform serial 2's to and from 100 while ambulating without difficulty.  Able to follow multi step commands    General Comments  VSS on RA. Vestibular assessment: pursuits, saccades, vertical and horizontal VOR all negative for nystagmus. Pt does report feeling swimmyheaded with difficulty focusing. Pt positive for nausea with VOR head thrust to Right. Dix hallpike to R  side dose produce dizziness "room spinning".    Exercises     Shoulder Instructions      Home Living Family/patient expects to be discharged to:: Private residence Living Arrangements: Spouse/significant other;Children Available Help at Discharge: Family;Available PRN/intermittently Type of Home: House Home Access: Stairs to enter [employer] of Steps: 3 Entrance Stairs-Rails: None Home Layout: One level               Home Equipment: None   Additional Comments: lives with fiance' and 5 y.o. 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[employer]         OT Problem List: Decreased activity tolerance      OT Treatment/Interventions:      OT Goals(Current goals can be found in the care plan section) Acute Rehab OT Goals Patient Stated Goal: To stop dizziness with mobility OT Goal Formulation: All assessment and education complete, DC therapy  OT Frequency:     Barriers to D/C:            Co-evaluation              AM-PAC OT "6 Clicks" Daily Activity     Outcome Measure Help from another person eating meals?: None Help from another person taking care of personal grooming?: None Help from another person toileting, which includes using toliet, bedpan, or urinal?: None Help from another person bathing (including washing, rinsing, drying)?: None Help from another person to put on and taking off regular upper body clothing?: None Help from another person to put on and taking off regular lower body clothing?: None 6 Click Score: 24   End of Session Nurse Communication: Mobility status  Activity Tolerance: Patient tolerated treatment well Patient left: Other (comment) (with PT )  OT Visit Diagnosis: Dizziness and giddiness (R42)                Time: 1962-2297 OT Time Calculation (min): 20 min Charges:  OT General Charges $OT Visit: 1 Visit OT Evaluation $OT  Eval Low Complexity: 1 Low  Eber Jones., OTR/L Acute Rehabilitation Services Pager [phone] Office [phone]   Jeani Hawking M 10/30/2019, 1:14 PM

## 2019-10-30 NOTE — TOC Transition Note (Signed)
Transition of Care [facility]) - CM/SW Discharge Note   Patient Details  Name: [name] MRN: [id number] Date of Birth: [birth year]  Transition of Care [facility]) CM/SW Contact:  Glennon Mac, RN Phone Number: 10/30/2019, 5:19 PM   Clinical Narrative:  29 y.o. female with past medical history significant for migraines, panic disorder who works at [employer] with left-sided numbness and weakness-code. Pt received tPA and was transferred to [facility]. Pt reports feeling as if she was having a panic attack with blurred vision, hyperventilation, L numbness, and HA. Pt also reported to have L weakness.    PTA, pt independent and living with spouse; PT recommending OP PT for vestibular rehab.  Referral to [facility] for follow up.          Patient Goals and CMS Choice        Discharge Placement                       Discharge Plan and Services  OP Therapy                                   Social Determinants of Health (SDOH) Interventions     Readmission Risk Interventions No flowsheet data found.  Quintella Baton, RN, BSN  Trauma/Neuro ICU Case Manager [phone]

## 2020-02-12 DIAGNOSIS — F319 Bipolar disorder, unspecified: Secondary | ICD-10-CM | POA: Insufficient documentation

## 2020-02-12 DIAGNOSIS — F419 Anxiety disorder, unspecified: Secondary | ICD-10-CM | POA: Insufficient documentation

## 2020-02-12 DIAGNOSIS — K581 Irritable bowel syndrome with constipation: Secondary | ICD-10-CM | POA: Insufficient documentation

## 2022-02-02 DIAGNOSIS — Z3A09 9 weeks gestation of pregnancy: Secondary | ICD-10-CM | POA: Diagnosis not present

## 2022-02-02 DIAGNOSIS — Z01419 Encounter for gynecological examination (general) (routine) without abnormal findings: Secondary | ICD-10-CM | POA: Diagnosis not present

## 2022-02-02 DIAGNOSIS — R69 Illness, unspecified: Secondary | ICD-10-CM | POA: Diagnosis not present

## 2022-02-02 DIAGNOSIS — O3680X0 Pregnancy with inconclusive fetal viability, not applicable or unspecified: Secondary | ICD-10-CM | POA: Diagnosis not present

## 2022-02-02 DIAGNOSIS — Z8759 Personal history of other complications of pregnancy, childbirth and the puerperium: Secondary | ICD-10-CM | POA: Insufficient documentation

## 2022-02-02 DIAGNOSIS — Z3689 Encounter for other specified antenatal screening: Secondary | ICD-10-CM | POA: Diagnosis not present

## 2022-02-22 DIAGNOSIS — Z6791 Unspecified blood type, Rh negative: Secondary | ICD-10-CM | POA: Insufficient documentation

## 2022-02-22 DIAGNOSIS — R768 Other specified abnormal immunological findings in serum: Secondary | ICD-10-CM | POA: Insufficient documentation

## 2022-02-23 DIAGNOSIS — R69 Illness, unspecified: Secondary | ICD-10-CM | POA: Diagnosis not present

## 2022-02-23 DIAGNOSIS — Z3689 Encounter for other specified antenatal screening: Secondary | ICD-10-CM | POA: Diagnosis not present

## 2022-02-23 DIAGNOSIS — O99891 Other specified diseases and conditions complicating pregnancy: Secondary | ICD-10-CM | POA: Diagnosis not present

## 2022-02-23 DIAGNOSIS — Z3682 Encounter for antenatal screening for nuchal translucency: Secondary | ICD-10-CM | POA: Diagnosis not present

## 2022-02-23 DIAGNOSIS — Z3A12 12 weeks gestation of pregnancy: Secondary | ICD-10-CM | POA: Diagnosis not present

## 2022-02-23 DIAGNOSIS — N898 Other specified noninflammatory disorders of vagina: Secondary | ICD-10-CM | POA: Diagnosis not present

## 2022-03-06 ENCOUNTER — Encounter (HOSPITAL_COMMUNITY): Payer: Self-pay

## 2022-03-06 ENCOUNTER — Other Ambulatory Visit: Payer: Self-pay

## 2022-03-06 DIAGNOSIS — O210 Mild hyperemesis gravidarum: Secondary | ICD-10-CM | POA: Diagnosis not present

## 2022-03-06 DIAGNOSIS — O99282 Endocrine, nutritional and metabolic diseases complicating pregnancy, second trimester: Secondary | ICD-10-CM | POA: Insufficient documentation

## 2022-03-06 DIAGNOSIS — E876 Hypokalemia: Secondary | ICD-10-CM | POA: Insufficient documentation

## 2022-03-06 DIAGNOSIS — J45909 Unspecified asthma, uncomplicated: Secondary | ICD-10-CM | POA: Insufficient documentation

## 2022-03-06 DIAGNOSIS — Z3A15 15 weeks gestation of pregnancy: Secondary | ICD-10-CM | POA: Diagnosis not present

## 2022-03-06 LAB — COMPREHENSIVE METABOLIC PANEL
ALT: 17 U/L (ref 0–44)
AST: 19 U/L (ref 15–41)
Albumin: 3.8 g/dL (ref 3.5–5.0)
Alkaline Phosphatase: 70 U/L (ref 38–126)
Anion gap: 11 (ref 5–15)
BUN: 5 mg/dL — ABNORMAL LOW (ref 6–20)
CO2: 25 mmol/L (ref 22–32)
Calcium: 9.6 mg/dL (ref 8.9–10.3)
Chloride: 101 mmol/L (ref 98–111)
Creatinine, Ser: 0.49 mg/dL (ref 0.44–1.00)
GFR, Estimated: >60 mL/min (ref >60)
Glucose, Bld: 98 mg/dL (ref 70–99)
Potassium: 3 mmol/L — ABNORMAL LOW (ref 3.5–5.1)
Sodium: 137 mmol/L (ref 135–145)
Total Bilirubin: 0.7 mg/dL (ref 0.3–1.2)
Total Protein: 7.8 g/dL (ref 6.5–8.1)

## 2022-03-06 LAB — CBC
HCT: 34.9 % — ABNORMAL LOW (ref 36.0–46.0)
Hemoglobin: 12.4 g/dL (ref 12.0–15.0)
MCH: 30.5 pg (ref 26.0–34.0)
MCHC: 35.5 g/dL (ref 30.0–36.0)
MCV: 85.7 fL (ref 80.0–100.0)
Platelets: 247 10*3/uL (ref 150–400)
RBC: 4.07 MIL/uL (ref 3.87–5.11)
RDW: 12.2 % (ref 11.5–15.5)
WBC: 8.6 10*3/uL (ref 4.0–10.5)
nRBC: 0 % (ref 0.0–0.2)

## 2022-03-06 LAB — LIPASE, BLOOD: Lipase: 48 U/L (ref 11–51)

## 2022-03-06 NOTE — ED Triage Notes (Signed)
Pt arrived from home via POV w c/o n/v x 6 days not been able to even keep water down x 4 days per pt. Pt states taht she is [redacted] weeks pregnant. Emesis x 10 per day. Pt states that she voided approx 2000 tonight. Pt states that the last time that she vomited there was pink in it and is worried that it may be blood. Denies fever. Abdominal cramping began approx 5 hours ago. 8/10.

## 2022-03-07 ENCOUNTER — Emergency Department (HOSPITAL_COMMUNITY)
Admission: EM | Admit: 2022-03-07 | Discharge: 2022-03-07 | Disposition: A | Discharge status: Home / Self Care | Payer: 59 | Attending: Emergency Medicine | Admitting: Emergency Medicine

## 2022-03-07 DIAGNOSIS — O210 Mild hyperemesis gravidarum: Secondary | ICD-10-CM

## 2022-03-07 LAB — URINALYSIS, ROUTINE W REFLEX MICROSCOPIC
Bilirubin Urine: NEGATIVE
Glucose, UA: NEGATIVE mg/dL
Hgb urine dipstick: NEGATIVE
Ketones, ur: 80 mg/dL — AB
Nitrite: NEGATIVE
Protein, ur: 100 mg/dL — AB
Specific Gravity, Urine: 1.023 (ref 1.005–1.030)
Squamous Epithelial / HPF: >50 — ABNORMAL HIGH (ref 0–5)
pH: 5 (ref 5.0–8.0)

## 2022-03-07 MED ORDER — SODIUM CHLORIDE 0.9 % IV BOLUS
1000.0000 mL | Freq: Once | INTRAVENOUS | Status: AC
  Administered 2022-03-07: 1000 mL via INTRAVENOUS

## 2022-03-07 MED ORDER — ONDANSETRON HCL 4 MG/2ML IJ SOLN
4.0000 mg | Freq: Once | INTRAMUSCULAR | Status: AC
  Administered 2022-03-07: 4 mg via INTRAVENOUS
  Filled 2022-03-07: qty 2

## 2022-03-07 MED ORDER — POTASSIUM CHLORIDE CRYS ER 20 MEQ PO TBCR: 40.0000 meq | EXTENDED_RELEASE_TABLET | Freq: Once | ORAL | Status: DC

## 2022-03-07 MED ORDER — PROCHLORPERAZINE EDISYLATE 10 MG/2ML IJ SOLN
10.0000 mg | Freq: Once | INTRAMUSCULAR | Status: AC
  Administered 2022-03-07: 10 mg via INTRAVENOUS
  Filled 2022-03-07: qty 2

## 2022-03-07 MED ORDER — DIPHENHYDRAMINE HCL 50 MG/ML IJ SOLN
25.0000 mg | Freq: Once | INTRAMUSCULAR | Status: AC
  Administered 2022-03-07: 25 mg via INTRAVENOUS
  Filled 2022-03-07: qty 1

## 2022-03-07 MED ORDER — DOXYLAMINE-PYRIDOXINE 10-10 MG PO TBEC: 1.0000 | DELAYED_RELEASE_TABLET | Freq: Two times a day (BID) | ORAL | 0 refills | Status: DC | PRN

## 2022-03-07 MED ORDER — ONDANSETRON 4 MG PO TBDP: 4.0000 mg | ORAL_TABLET | Freq: Three times a day (TID) | ORAL | 0 refills | Status: DC | PRN

## 2022-03-07 NOTE — Discharge Instructions (Addendum)
You were evaluated in the Emergency Department and after careful evaluation, we did not find any emergent condition requiring admission or further testing in the hospital.  Your exam/testing today is overall reassuring.  Symptoms likely related to your pregnancy.  Recommend using the doxylamine-pyridoxine as directed for nausea.  Use the Zofran medication as needed for breakthrough symptoms.  Please return to the Emergency Department if you experience any worsening of your condition.   Thank you for allowing [place] to be a part of your care.

## 2022-03-07 NOTE — ED Provider Notes (Signed)
AP-EMERGENCY DEPT [facility] Emergency Department Provider Note MRN:  [id number]  Arrival date & time: 03/07/22     Chief Complaint   Emesis During Pregnancy   History of Present Illness   [name] is a 31 y.o. year-old female with a pertinent past medical history presenting to the ED with chief complaint of emesis during pregnancy.  Patient is [redacted] weeks pregnant.  Persistent nausea vomiting for the past 6 days, unable to keep any fluids down for the past 4 days.  Feels dehydrated.  Having some lower abdominal cramping.  No vaginal bleeding or discharge or leakage of fluid.  No chest pain.  Review of Systems  A thorough review of systems was obtained and all systems are negative except as noted in the HPI and PMH.   Patient's Health History    Past Medical History:  Diagnosis Date   Allergy    Anxiety    Anxiety and depression    Asthma    Chest pain, unspecified    Depression    Dyspnea    Gluten intolerance    Hyperventilation    IBS (irritable bowel syndrome)    Malaise and fatigue    Migraine    Panic attack    Panic attack    Pregnancy induced hypertension    SVD (spontaneous vaginal delivery) 08/31/2014    Past Surgical History:  Procedure Laterality Date   ADENOIDECTOMY     TONSILLECTOMY     WISDOM TOOTH EXTRACTION      Family History  Problem Relation Age of Onset   Hypertension Mother    Cancer Mother    Heart disease Maternal Grandfather     Social History   Socioeconomic History   Marital status: Single    Spouse name: Not on file   Number of children: Not on file   Years of education: Not on file   Highest education level: Not on file  Occupational History   Occupation: Part time  Tobacco Use   Smoking status: Never   Smokeless tobacco: Never  Substance and Sexual Activity   Alcohol use: No   Drug use: No   Sexual activity: Yes    Partners: Male  Other Topics Concern   Not on file  Social History Narrative   Gets regular  exercise   Social Determinants of Health   Financial Resource Strain: Not on file  Food Insecurity: Not on file  Transportation Needs: Not on file  Physical Activity: Not on file  Stress: Not on file  Social Connections: Not on file  Intimate Partner Violence: Not on file     Physical Exam   Vitals:   03/06/22 2105 03/07/22 0236  BP: 136/87   Pulse: (!) 108 92  Resp: 19   Temp: 98.2 F (36.8 C)   SpO2: 100% 98%    CONSTITUTIONAL: Well-appearing, NAD NEURO/PSYCH:  Alert and oriented x 3, no focal deficits EYES:  eyes equal and reactive ENT/NECK:  no LAD, no JVD CARDIO: Regular rate, well-perfused, normal S1 and S2 PULM:  CTAB no wheezing or rhonchi GI/GU:  non-distended, non-tender MSK/SPINE:  No gross deformities, no edema SKIN:  no rash, atraumatic   *Additional and/or pertinent findings included in MDM below  Diagnostic and Interventional Summary    EKG Interpretation  Date/Time:    Ventricular Rate:    PR Interval:    QRS Duration:   QT Interval:    QTC Calculation:   R Axis:     Text  Interpretation:         Labs Reviewed  COMPREHENSIVE METABOLIC PANEL - Abnormal; Notable for the following components:      Result Value   Potassium 3.0 (*)    BUN 5 (*)    All other components within normal limits  CBC - Abnormal; Notable for the following components:   HCT 34.9 (*)    All other components within normal limits  URINALYSIS, ROUTINE W REFLEX MICROSCOPIC - Abnormal; Notable for the following components:   Color, Urine AMBER (*)    APPearance CLOUDY (*)    Ketones, ur 80 (*)    Protein, ur 100 (*)    Leukocytes,Ua TRACE (*)    Bacteria, UA RARE (*)    Squamous Epithelial / LPF >50 (*)    All other components within normal limits  LIPASE, BLOOD  POC URINE PREG, ED    No orders to display    Medications  potassium chloride SA (KLOR-CON M) CR tablet 40 mEq (has no administration in time range)  ondansetron (ZOFRAN) injection 4 mg (4 mg  Intravenous Given 03/07/22 0105)  sodium chloride 0.9 % bolus 1,000 mL (0 mLs Intravenous Stopped 03/07/22 0152)  prochlorperazine (COMPAZINE) injection 10 mg (10 mg Intravenous Given 03/07/22 0229)  diphenhydrAMINE (BENADRYL) injection 25 mg (25 mg Intravenous Given 03/07/22 0229)     Procedures  /  Critical Care Ultrasound ED OB Pelvic  Date/Time: 03/07/2022 1:50 AM  Performed by: Sabas Sous, MD Authorized by: Sabas Sous, MD   Procedure details:    Indications: pregnant with abdominal pain     Assess:  Fetal viability   Technique:  Transabdominal obstetric (HCG+) exam   Images: archived    Uterine findings:    Intrauterine pregnancy: identified      Comments:     Normal fetal movement and heart rate   ED Course and Medical Decision Making  Initial Impression and Ddx Hyperemesis gravidarum, abdomen soft and nontender, given the cramping will evaluate the fetus for movement, heart rate.  Past medical/surgical history that increases complexity of ED encounter: [redacted] weeks pregnant  Interpretation of Diagnostics I personally reviewed the laboratory assessment and my interpretation is as follows: Mild hypokalemia, otherwise no significant blood count or electrolyte disturbance  Urinalysis shows likely contaminated sample given the amount of squamous cells  Patient Reassessment and Ultimate Disposition/Management     Patient feeling a lot better after medications listed above, appropriate for discharge.  Patient management required discussion with the following services or consulting groups:  None  Complexity of Problems Addressed Acute illness or injury that poses threat of life of bodily function  Additional Data Reviewed and Analyzed Further history obtained from: None  Additional Factors Impacting ED Encounter Risk Prescriptions  Elmer Sow. Pilar Plate, MD [facility] Emergency Medicine [facility] [email or web address]  Final Clinical  Impressions(s) / ED Diagnoses     ICD-10-CM   1. Hyperemesis gravidarum  O21.0       ED Discharge Orders          Ordered    Doxylamine-Pyridoxine 10-10 MG TBEC  2 times daily PRN        03/07/22 0442    ondansetron (ZOFRAN-ODT) 4 MG disintegrating tablet  Every 8 hours PRN        03/07/22 0442             Discharge Instructions Discussed with and Provided to Patient:     Discharge Instructions  You were evaluated in the Emergency Department and after careful evaluation, we did not find any emergent condition requiring admission or further testing in the hospital.  Your exam/testing today is overall reassuring.  Symptoms likely related to your pregnancy.  Recommend using the doxylamine-pyridoxine as directed for nausea.  Use the Zofran medication as needed for breakthrough symptoms.  Please return to the Emergency Department if you experience any worsening of your condition.   Thank you for allowing [place] to be a part of your care.       Maudie Flakes, MD 03/07/22 [phone]

## 2022-03-23 DIAGNOSIS — R309 Painful micturition, unspecified: Secondary | ICD-10-CM | POA: Diagnosis not present

## 2022-04-20 DIAGNOSIS — Z3689 Encounter for other specified antenatal screening: Secondary | ICD-10-CM | POA: Diagnosis not present

## 2022-04-20 DIAGNOSIS — O43199 Other malformation of placenta, unspecified trimester: Secondary | ICD-10-CM | POA: Insufficient documentation

## 2022-05-08 DIAGNOSIS — R109 Unspecified abdominal pain: Secondary | ICD-10-CM | POA: Diagnosis not present

## 2022-05-08 DIAGNOSIS — R768 Other specified abnormal immunological findings in serum: Secondary | ICD-10-CM | POA: Diagnosis not present

## 2022-05-10 DIAGNOSIS — R768 Other specified abnormal immunological findings in serum: Secondary | ICD-10-CM | POA: Diagnosis not present

## 2022-05-10 DIAGNOSIS — R1011 Right upper quadrant pain: Secondary | ICD-10-CM | POA: Diagnosis not present

## 2022-05-10 DIAGNOSIS — O26899 Other specified pregnancy related conditions, unspecified trimester: Secondary | ICD-10-CM | POA: Diagnosis not present

## 2022-05-10 DIAGNOSIS — Z882 Allergy status to sulfonamides status: Secondary | ICD-10-CM | POA: Diagnosis not present

## 2022-05-10 DIAGNOSIS — R109 Unspecified abdominal pain: Secondary | ICD-10-CM | POA: Diagnosis not present

## 2022-05-10 DIAGNOSIS — Z3A00 Weeks of gestation of pregnancy not specified: Secondary | ICD-10-CM | POA: Diagnosis not present

## 2022-06-13 DIAGNOSIS — Z1152 Encounter for screening for COVID-19: Secondary | ICD-10-CM | POA: Diagnosis not present

## 2022-06-13 DIAGNOSIS — R509 Fever, unspecified: Secondary | ICD-10-CM | POA: Diagnosis not present

## 2022-06-13 DIAGNOSIS — R059 Cough, unspecified: Secondary | ICD-10-CM | POA: Diagnosis not present

## 2022-06-13 DIAGNOSIS — J101 Influenza due to other identified influenza virus with other respiratory manifestations: Secondary | ICD-10-CM | POA: Diagnosis not present

## 2022-06-20 DIAGNOSIS — O26893 Other specified pregnancy related conditions, third trimester: Secondary | ICD-10-CM | POA: Diagnosis not present

## 2022-06-20 DIAGNOSIS — Z3689 Encounter for other specified antenatal screening: Secondary | ICD-10-CM | POA: Diagnosis not present

## 2022-06-20 DIAGNOSIS — Z2913 Encounter for prophylactic Rho(D) immune globulin: Secondary | ICD-10-CM | POA: Diagnosis not present

## 2022-06-20 DIAGNOSIS — Z6791 Unspecified blood type, Rh negative: Secondary | ICD-10-CM | POA: Diagnosis not present

## 2022-06-20 DIAGNOSIS — Z3A29 29 weeks gestation of pregnancy: Secondary | ICD-10-CM | POA: Diagnosis not present

## 2022-06-20 DIAGNOSIS — O09293 Supervision of pregnancy with other poor reproductive or obstetric history, third trimester: Secondary | ICD-10-CM | POA: Diagnosis not present

## 2022-06-20 DIAGNOSIS — R69 Illness, unspecified: Secondary | ICD-10-CM | POA: Diagnosis not present

## 2022-06-21 DIAGNOSIS — R7309 Other abnormal glucose: Secondary | ICD-10-CM | POA: Diagnosis not present

## 2022-07-06 DIAGNOSIS — O24419 Gestational diabetes mellitus in pregnancy, unspecified control: Secondary | ICD-10-CM | POA: Insufficient documentation

## 2022-07-06 DIAGNOSIS — Z23 Encounter for immunization: Secondary | ICD-10-CM | POA: Diagnosis not present

## 2022-07-11 DIAGNOSIS — O0993 Supervision of high risk pregnancy, unspecified, third trimester: Secondary | ICD-10-CM | POA: Diagnosis not present

## 2022-07-11 DIAGNOSIS — O24410 Gestational diabetes mellitus in pregnancy, diet controlled: Secondary | ICD-10-CM | POA: Diagnosis not present

## 2022-07-11 DIAGNOSIS — Z3A32 32 weeks gestation of pregnancy: Secondary | ICD-10-CM | POA: Diagnosis not present

## 2022-07-15 ENCOUNTER — Encounter (HOSPITAL_COMMUNITY): Payer: Self-pay | Admitting: Obstetrics & Gynecology

## 2022-07-15 ENCOUNTER — Inpatient Hospital Stay (HOSPITAL_COMMUNITY)
Admission: AD | Admit: 2022-07-15 | Discharge: 2022-07-15 | Disposition: A | Discharge status: Home / Self Care | Payer: 59 | Attending: Obstetrics & Gynecology | Admitting: Obstetrics & Gynecology

## 2022-07-15 ENCOUNTER — Inpatient Hospital Stay (HOSPITAL_COMMUNITY): Payer: 59

## 2022-07-15 DIAGNOSIS — R0602 Shortness of breath: Secondary | ICD-10-CM | POA: Diagnosis not present

## 2022-07-15 DIAGNOSIS — K9041 Non-celiac gluten sensitivity: Secondary | ICD-10-CM | POA: Insufficient documentation

## 2022-07-15 DIAGNOSIS — O99353 Diseases of the nervous system complicating pregnancy, third trimester: Secondary | ICD-10-CM | POA: Diagnosis not present

## 2022-07-15 DIAGNOSIS — O26893 Other specified pregnancy related conditions, third trimester: Secondary | ICD-10-CM | POA: Insufficient documentation

## 2022-07-15 DIAGNOSIS — O360990 Maternal care for other rhesus isoimmunization, unspecified trimester, not applicable or unspecified: Secondary | ICD-10-CM | POA: Insufficient documentation

## 2022-07-15 DIAGNOSIS — G43109 Migraine with aura, not intractable, without status migrainosus: Secondary | ICD-10-CM

## 2022-07-15 DIAGNOSIS — E876 Hypokalemia: Secondary | ICD-10-CM | POA: Diagnosis not present

## 2022-07-15 DIAGNOSIS — R030 Elevated blood-pressure reading, without diagnosis of hypertension: Secondary | ICD-10-CM | POA: Diagnosis not present

## 2022-07-15 DIAGNOSIS — O99283 Endocrine, nutritional and metabolic diseases complicating pregnancy, third trimester: Secondary | ICD-10-CM | POA: Diagnosis not present

## 2022-07-15 DIAGNOSIS — K219 Gastro-esophageal reflux disease without esophagitis: Secondary | ICD-10-CM | POA: Insufficient documentation

## 2022-07-15 DIAGNOSIS — Z3A33 33 weeks gestation of pregnancy: Secondary | ICD-10-CM | POA: Diagnosis not present

## 2022-07-15 DIAGNOSIS — R000 Tachycardia, unspecified: Secondary | ICD-10-CM | POA: Diagnosis not present

## 2022-07-15 DIAGNOSIS — R200 Anesthesia of skin: Secondary | ICD-10-CM | POA: Diagnosis not present

## 2022-07-15 LAB — CBC
HCT: 29.9 % — ABNORMAL LOW (ref 36.0–46.0)
Hemoglobin: 10.4 g/dL — ABNORMAL LOW (ref 12.0–15.0)
MCH: 30.1 pg (ref 26.0–34.0)
MCHC: 34.8 g/dL (ref 30.0–36.0)
MCV: 86.7 fL (ref 80.0–100.0)
Platelets: 206 10*3/uL (ref 150–400)
RBC: 3.45 MIL/uL — ABNORMAL LOW (ref 3.87–5.11)
RDW: 13.2 % (ref 11.5–15.5)
WBC: 9.7 10*3/uL (ref 4.0–10.5)
nRBC: 0 % (ref 0.0–0.2)

## 2022-07-15 LAB — URINALYSIS, ROUTINE W REFLEX MICROSCOPIC
Bilirubin Urine: NEGATIVE
Glucose, UA: NEGATIVE mg/dL
Hgb urine dipstick: NEGATIVE
Ketones, ur: 20 mg/dL — AB
Nitrite: NEGATIVE
Protein, ur: NEGATIVE mg/dL
Specific Gravity, Urine: 1.01 (ref 1.005–1.030)
pH: 8 (ref 5.0–8.0)

## 2022-07-15 LAB — COMPREHENSIVE METABOLIC PANEL
ALT: 38 U/L (ref 0–44)
AST: 29 U/L (ref 15–41)
Albumin: 2.6 g/dL — ABNORMAL LOW (ref 3.5–5.0)
Alkaline Phosphatase: 104 U/L (ref 38–126)
Anion gap: 13 (ref 5–15)
BUN: 5 mg/dL — ABNORMAL LOW (ref 6–20)
CO2: 20 mmol/L — ABNORMAL LOW (ref 22–32)
Calcium: 8.8 mg/dL — ABNORMAL LOW (ref 8.9–10.3)
Chloride: 99 mmol/L (ref 98–111)
Creatinine, Ser: 0.54 mg/dL (ref 0.44–1.00)
GFR, Estimated: >60 mL/min (ref >60)
Glucose, Bld: 109 mg/dL — ABNORMAL HIGH (ref 70–99)
Potassium: 3 mmol/L — ABNORMAL LOW (ref 3.5–5.1)
Sodium: 132 mmol/L — ABNORMAL LOW (ref 135–145)
Total Bilirubin: 0.6 mg/dL (ref 0.3–1.2)
Total Protein: 5.9 g/dL — ABNORMAL LOW (ref 6.5–8.1)

## 2022-07-15 LAB — GLUCOSE, CAPILLARY: Glucose-Capillary: 101 mg/dL — ABNORMAL HIGH (ref 70–99)

## 2022-07-15 LAB — PROTEIN / CREATININE RATIO, URINE
Creatinine, Urine: 74 mg/dL
Protein Creatinine Ratio: 0.19 mg/mg{Cre} — ABNORMAL HIGH (ref 0.00–0.15)
Total Protein, Urine: 14 mg/dL

## 2022-07-15 LAB — D-DIMER, QUANTITATIVE: D-Dimer, Quant: 0.44 ug/mL-FEU (ref 0.00–0.50)

## 2022-07-15 MED ORDER — ACETAMINOPHEN-CAFFEINE 500-65 MG PO TABS
2.0000 | ORAL_TABLET | Freq: Once | ORAL | Status: AC
  Administered 2022-07-15: 2 via ORAL
  Filled 2022-07-15: qty 2

## 2022-07-15 MED ORDER — LACTATED RINGERS IV BOLUS
1000.0000 mL | Freq: Once | INTRAVENOUS | Status: AC
  Administered 2022-07-15: 1000 mL via INTRAVENOUS

## 2022-07-15 MED ORDER — CYCLOBENZAPRINE HCL 5 MG PO TABS
10.0000 mg | ORAL_TABLET | Freq: Once | ORAL | Status: AC
  Administered 2022-07-15: 10 mg via ORAL
  Filled 2022-07-15: qty 2

## 2022-07-15 MED ORDER — POTASSIUM CHLORIDE CRYS ER 20 MEQ PO TBCR
20.0000 meq | EXTENDED_RELEASE_TABLET | Freq: Once | ORAL | Status: AC
  Administered 2022-07-15: 20 meq via ORAL
  Filled 2022-07-15: qty 1

## 2022-07-15 NOTE — MAU Note (Signed)
SOB and tingling started today around 12:15 today

## 2022-07-15 NOTE — MAU Provider Note (Signed)
History     RL:[id number]  Arrival date and time: 07/15/22 1334    Chief Complaint  Patient presents with   Tingling   Shortness of Breath     HPI [name] is a 32 y.o. at 57w3dwho presents for arm numbness & shortness of breath.  Symptoms started around 1130 this morning. Reports bilateral hand numbness/tingling that spread to her upper arms. Shortly after she started having difficulty catching her breath. Has had left frontal headache for the last 2 days. Taking tylenol without relief (last dose yesterday). Has history of migraines with aura- states current headache not as bad as previous migraines. History of panic attacks - last one was last week - currently not treating.  Denies fever, visual disturbance, sore throat, cough, chest pain, LOC, contractions, LOF, vaginal bleeding, calf pain. Reports good fetal movement.   OB History     Gravida  2   Para  1   Term  1   Preterm      AB      Living  1      SAB      IAB      Ectopic      Multiple  0   Live Births  1           Past Medical History:  Diagnosis Date   Allergy    Anxiety    Asthma    Chest pain, unspecified    Depression    Dyspnea    Gluten intolerance    Hyperventilation    IBS (irritable bowel syndrome)    Malaise and fatigue    Migraine    Panic attack    Pregnancy induced hypertension     Past Surgical History:  Procedure Laterality Date   ADENOIDECTOMY     TONSILLECTOMY     WISDOM TOOTH EXTRACTION      Family History  Problem Relation Age of Onset   Hypertension Mother    Cancer Mother    Heart disease Maternal Grandfather     Allergies  Allergen Reactions   Hydrocodone Nausea Only and Other (See Comments)    Sweating/ "hot flashes"   Sulfa Antibiotics Rash    No current facility-administered medications on file prior to encounter.   Current Outpatient Medications on File Prior to Encounter  Medication Sig Dispense Refill   Albuterol Sulfate, sensor,  108 (90 Base) MCG/ACT AEPB Inhale into the lungs.     aspirin EC 81 MG tablet Take by mouth.     DULoxetine (CYMBALTA) 60 MG capsule Take 60 mg by mouth daily.     ferrous sulfate 325 (65 FE) MG tablet Take 325 mg by mouth daily with breakfast.     glucose blood (ONETOUCH VERIO) test strip USE WHEN TESTING GLUCOSE 4 TIMES DAILY.     Lancets (ONETOUCH DELICA PLUS [id number] MISC Use when sticking your finger     lansoprazole (PREVACID) 30 MG capsule Take 30 mg by mouth daily.     MAGNESIUM PO Take 1 tablet by mouth daily.     Probiotic Product (PROBIOTIC ADVANCED PO) Take 1 capsule by mouth daily.     valACYclovir (VALTREX) 500 MG tablet Take 500 mg by mouth 2 (two) times daily.       ROS Pertinent positives and negative per HPI, all others reviewed and negative  Physical Exam   BP (!) 133/94   Pulse 92   Temp 98.5 F (36.9 C)   LMP 11/22/2021 (Exact Date)  SpO2 99%   Patient Vitals for the past 24 hrs:  BP Temp Pulse SpO2  07/15/22 1600 (!) 133/94 -- 92 99 %  07/15/22 1545 (!) 130/93 -- 96 98 %  07/15/22 1530 (!) 122/91 -- 87 100 %  07/15/22 1525 133/86 -- 99 100 %  07/15/22 1450 -- -- -- 100 %  07/15/22 1431 121/89 -- (!) 130 --  07/15/22 1421 (!) 131/90 -- 98 --  07/15/22 1344 126/81 98.5 F (36.9 C) (!) 102 99 %    Physical Exam Vitals and nursing note reviewed.  Constitutional:      General: She is not in acute distress.    Appearance: She is well-developed. She is not ill-appearing or diaphoretic.  HENT:     Head: Normocephalic and atraumatic.  Eyes:     General: No scleral icterus.       Right eye: No discharge.        Left eye: No discharge.     Extraocular Movements: Extraocular movements intact.     Conjunctiva/sclera: Conjunctivae normal.     Pupils: Pupils are equal, round, and reactive to light.  Cardiovascular:     Rate and Rhythm: Normal rate and regular rhythm.  Pulmonary:     Effort: Pulmonary effort is normal. No accessory muscle usage or  respiratory distress.     Breath sounds: Normal breath sounds.  Musculoskeletal:     Right lower leg: No edema.     Left lower leg: No edema.  Skin:    General: Skin is warm and dry.  Neurological:     General: No focal deficit present.     Mental Status: She is alert and oriented to person, place, and time.     Cranial Nerves: No facial asymmetry.     Motor: Motor function is intact. No weakness.     Deep Tendon Reflexes: Reflexes are normal and symmetric.  Psychiatric:        Mood and Affect: Mood normal.        Behavior: Behavior normal.      Cervical Exam Dilation: Closed Exam by:: Paulina Fusi Baseline 130, moderate variability, 15x15 accels, no decels Toco: none Cat: 1  Labs Results for orders placed or performed during the hospital encounter of 07/15/22 (from the past 24 hour(s))  Glucose, capillary     Status: Abnormal   Collection Time: 07/15/22  2:29 PM  Result Value Ref Range   Glucose-Capillary 101 (H) 70 - 99 mg/dL  CBC     Status: Abnormal   Collection Time: 07/15/22  2:32 PM  Result Value Ref Range   WBC 9.7 4.0 - 10.5 K/uL   RBC 3.45 (L) 3.87 - 5.11 MIL/uL   Hemoglobin 10.4 (L) 12.0 - 15.0 g/dL   HCT 29.9 (L) 36.0 - 46.0 %   MCV 86.7 80.0 - 100.0 fL   MCH 30.1 26.0 - 34.0 pg   MCHC 34.8 30.0 - 36.0 g/dL   RDW 13.2 11.5 - 15.5 %   Platelets 206 150 - 400 K/uL   nRBC 0.0 0.0 - 0.2 %  Comprehensive metabolic panel     Status: Abnormal   Collection Time: 07/15/22  2:32 PM  Result Value Ref Range   Sodium 132 (L) 135 - 145 mmol/L   Potassium 3.0 (L) 3.5 - 5.1 mmol/L   Chloride 99 98 - 111 mmol/L   CO2 20 (L) 22 - 32 mmol/L   Glucose, Bld 109 (H) 70 - 99 mg/dL  BUN 5 (L) 6 - 20 mg/dL   Creatinine, Ser 0.54 0.44 - 1.00 mg/dL   Calcium 8.8 (L) 8.9 - 10.3 mg/dL   Total Protein 5.9 (L) 6.5 - 8.1 g/dL   Albumin 2.6 (L) 3.5 - 5.0 g/dL   AST 29 15 - 41 U/L   ALT 38 0 - 44 U/L   Alkaline Phosphatase 104 38 - 126 U/L   Total Bilirubin 0.6 0.3 - 1.2  mg/dL   GFR, Estimated >60 >60 mL/min   Anion gap 13 5 - 15  D-dimer, quantitative     Status: None   Collection Time: 07/15/22  2:32 PM  Result Value Ref Range   D-Dimer, Quant 0.44 0.00 - 0.50 ug/mL-FEU  Urinalysis, Routine w reflex microscopic -Urine, Clean Catch     Status: Abnormal   Collection Time: 07/15/22  3:01 PM  Result Value Ref Range   Color, Urine YELLOW YELLOW   APPearance HAZY (A) CLEAR   Specific Gravity, Urine 1.010 1.005 - 1.030   pH 8.0 5.0 - 8.0   Glucose, UA NEGATIVE NEGATIVE mg/dL   Hgb urine dipstick NEGATIVE NEGATIVE   Bilirubin Urine NEGATIVE NEGATIVE   Ketones, ur 20 (A) NEGATIVE mg/dL   Protein, ur NEGATIVE NEGATIVE mg/dL   Nitrite NEGATIVE NEGATIVE   Leukocytes,Ua MODERATE (A) NEGATIVE   RBC / HPF 0-5 0 - 5 RBC/hpf   WBC, UA 0-5 0 - 5 WBC/hpf   Bacteria, UA RARE (A) NONE SEEN   Squamous Epithelial / HPF 21-50 0 - 5 /HPF  Protein / creatinine ratio, urine     Status: Abnormal   Collection Time: 07/15/22  3:01 PM  Result Value Ref Range   Creatinine, Urine 74 mg/dL   Total Protein, Urine 14 mg/dL   Protein Creatinine Ratio 0.19 (H) 0.00 - 0.15 mg/mg[Cre]    Imaging DG Chest 2 View  Result Date: 07/15/2022 CLINICAL DATA:  Shortness of breath EXAM: CHEST - 2 VIEW COMPARISON:  10/29/2021 and prior radiographs FINDINGS: The cardiomediastinal silhouette is unremarkable. There is no evidence of focal airspace disease, pulmonary edema, suspicious pulmonary nodule/mass, pleural effusion, or pneumothorax. No acute bony abnormalities are identified. IMPRESSION: No active cardiopulmonary disease. Electronically Signed   By: Margarette Canada M.D.   On: 07/15/2022 15:48    MAU Course  Procedures Lab Orders         CBC         Comprehensive metabolic panel         D-dimer, quantitative         Glucose, capillary         Urinalysis, Routine w reflex microscopic -Urine, Clean Catch         Protein / creatinine ratio, urine    Meds ordered this encounter   Medications   acetaminophen-caffeine (EXCEDRIN TENSION HEADACHE) 500-65 MG per tablet 2 tablet   cyclobenzaprine (FLEXERIL) tablet 10 mg   lactated ringers bolus 1,000 mL   potassium chloride SA (KLOR-CON M) CR tablet 20 mEq   Imaging Orders         DG Chest 2 View     MDM Patient presents with bilateral arm tingling, shortness of breath. SpO2 >99%. Some mildly elevated BPs.  Reviewed previous ER visit & prenatal visits in care everywhere. Patient has history significant for chronic extremity paresthesias (likely associated with migraines), migraines with aura, & anxiety attacks.   SOB- SpO2 normal. Lung sounds clear throughout. D-dimer normal. Negative chest xray EKG reviewed by Dr. Dorena Cookey  Numbness/tingling bilateral. Normal strength & neuro exam. No neck/back pain.  Treated with excedrin tension & flexeril. Reports improvement in all symptoms.  Also given IV fluid bolus & dose of oral potassium.   By end of visit started having contractions on monitor. Reports that some are painful. Cervix closed/thick.   Preeclampsia labs normal. Patient has appointment with her OB on Tuesday Assessment and Plan   1. Migraine with aura and without status migrainosus, not intractable  -Symptoms resolved  2. Hypokalemia  -Given dose of potassium in MAU. No n/v/d.   3. [redacted] weeks gestation of pregnancy   4. Elevated BP without diagnosis of hypertension  -Reviewed s/s preeclampsia. Patient has OB appointment on Tuesday. PEC labs normal     Jorje Guild, NP 07/15/22 4:56 PM

## 2022-07-15 NOTE — MAU Note (Signed)
.  [name] is a 32 y.o. at 72w3dhere in MAU reporting: about 1 hour ago started having tingling in her hand and up her arm to her face.  Then started having some Shortness of breath.  Has felt baby move all day but not in the last 30 min. Stated she is starting to feel some lower abd pressure and cramping but does not feel like ctx.  Goes to [employer] [facility] with GDM diet controlled LMP:  Onset of complaint: 1 Hour Pain score: 5 Vitals:   07/15/22 1344  BP: 126/81  Pulse: (!) 102  Temp: 98.5 F (36.9 C)  SpO2: 99%     FHT:152 Lab orders placed from triage:  u/a

## 2022-07-18 DIAGNOSIS — O98413 Viral hepatitis complicating pregnancy, third trimester: Secondary | ICD-10-CM | POA: Diagnosis not present

## 2022-07-18 DIAGNOSIS — Z3A33 33 weeks gestation of pregnancy: Secondary | ICD-10-CM | POA: Diagnosis not present

## 2022-07-18 DIAGNOSIS — O3663X0 Maternal care for excessive fetal growth, third trimester, not applicable or unspecified: Secondary | ICD-10-CM | POA: Diagnosis not present

## 2022-07-18 DIAGNOSIS — Z3689 Encounter for other specified antenatal screening: Secondary | ICD-10-CM | POA: Diagnosis not present

## 2022-07-18 DIAGNOSIS — B181 Chronic viral hepatitis B without delta-agent: Secondary | ICD-10-CM | POA: Diagnosis not present

## 2022-07-18 DIAGNOSIS — O43893 Other placental disorders, third trimester: Secondary | ICD-10-CM | POA: Diagnosis not present

## 2022-07-18 DIAGNOSIS — O26893 Other specified pregnancy related conditions, third trimester: Secondary | ICD-10-CM | POA: Diagnosis not present

## 2022-07-18 DIAGNOSIS — Z6791 Unspecified blood type, Rh negative: Secondary | ICD-10-CM | POA: Diagnosis not present

## 2022-07-18 DIAGNOSIS — O09293 Supervision of pregnancy with other poor reproductive or obstetric history, third trimester: Secondary | ICD-10-CM | POA: Diagnosis not present

## 2022-07-25 DIAGNOSIS — O24419 Gestational diabetes mellitus in pregnancy, unspecified control: Secondary | ICD-10-CM | POA: Diagnosis not present

## 2022-07-25 DIAGNOSIS — Z3A00 Weeks of gestation of pregnancy not specified: Secondary | ICD-10-CM | POA: Diagnosis not present

## 2022-08-08 DIAGNOSIS — Z3A36 36 weeks gestation of pregnancy: Secondary | ICD-10-CM | POA: Diagnosis not present

## 2022-08-08 DIAGNOSIS — Z885 Allergy status to narcotic agent status: Secondary | ICD-10-CM | POA: Diagnosis not present

## 2022-08-08 DIAGNOSIS — Z79899 Other long term (current) drug therapy: Secondary | ICD-10-CM | POA: Diagnosis not present

## 2022-08-08 DIAGNOSIS — O4703 False labor before 37 completed weeks of gestation, third trimester: Secondary | ICD-10-CM | POA: Diagnosis not present

## 2022-08-08 DIAGNOSIS — A6000 Herpesviral infection of urogenital system, unspecified: Secondary | ICD-10-CM | POA: Diagnosis not present

## 2022-08-08 DIAGNOSIS — O24410 Gestational diabetes mellitus in pregnancy, diet controlled: Secondary | ICD-10-CM | POA: Diagnosis not present

## 2022-08-08 DIAGNOSIS — Z7982 Long term (current) use of aspirin: Secondary | ICD-10-CM | POA: Diagnosis not present

## 2022-08-08 DIAGNOSIS — Z7951 Long term (current) use of inhaled steroids: Secondary | ICD-10-CM | POA: Diagnosis not present

## 2022-08-08 DIAGNOSIS — O133 Gestational [pregnancy-induced] hypertension without significant proteinuria, third trimester: Secondary | ICD-10-CM | POA: Diagnosis not present

## 2022-08-08 DIAGNOSIS — R519 Headache, unspecified: Secondary | ICD-10-CM | POA: Diagnosis not present

## 2022-08-08 DIAGNOSIS — Z3689 Encounter for other specified antenatal screening: Secondary | ICD-10-CM | POA: Diagnosis not present

## 2022-08-08 DIAGNOSIS — F319 Bipolar disorder, unspecified: Secondary | ICD-10-CM | POA: Diagnosis not present

## 2022-08-08 DIAGNOSIS — O99343 Other mental disorders complicating pregnancy, third trimester: Secondary | ICD-10-CM | POA: Diagnosis not present

## 2022-08-08 DIAGNOSIS — O368130 Decreased fetal movements, third trimester, not applicable or unspecified: Secondary | ICD-10-CM | POA: Diagnosis not present

## 2022-08-08 DIAGNOSIS — O26893 Other specified pregnancy related conditions, third trimester: Secondary | ICD-10-CM | POA: Diagnosis not present

## 2022-08-08 DIAGNOSIS — Z882 Allergy status to sulfonamides status: Secondary | ICD-10-CM | POA: Diagnosis not present

## 2022-08-08 DIAGNOSIS — O98313 Other infections with a predominantly sexual mode of transmission complicating pregnancy, third trimester: Secondary | ICD-10-CM | POA: Diagnosis not present

## 2022-08-09 DIAGNOSIS — Z79899 Other long term (current) drug therapy: Secondary | ICD-10-CM | POA: Diagnosis not present

## 2022-08-09 DIAGNOSIS — Z885 Allergy status to narcotic agent status: Secondary | ICD-10-CM | POA: Diagnosis not present

## 2022-08-09 DIAGNOSIS — O368130 Decreased fetal movements, third trimester, not applicable or unspecified: Secondary | ICD-10-CM | POA: Diagnosis not present

## 2022-08-09 DIAGNOSIS — O4703 False labor before 37 completed weeks of gestation, third trimester: Secondary | ICD-10-CM | POA: Diagnosis not present

## 2022-08-09 DIAGNOSIS — R519 Headache, unspecified: Secondary | ICD-10-CM | POA: Diagnosis not present

## 2022-08-09 DIAGNOSIS — O98313 Other infections with a predominantly sexual mode of transmission complicating pregnancy, third trimester: Secondary | ICD-10-CM | POA: Diagnosis not present

## 2022-08-09 DIAGNOSIS — O24410 Gestational diabetes mellitus in pregnancy, diet controlled: Secondary | ICD-10-CM | POA: Diagnosis not present

## 2022-08-09 DIAGNOSIS — O26893 Other specified pregnancy related conditions, third trimester: Secondary | ICD-10-CM | POA: Diagnosis not present

## 2022-08-09 DIAGNOSIS — O133 Gestational [pregnancy-induced] hypertension without significant proteinuria, third trimester: Secondary | ICD-10-CM | POA: Diagnosis not present

## 2022-08-09 DIAGNOSIS — Z7951 Long term (current) use of inhaled steroids: Secondary | ICD-10-CM | POA: Diagnosis not present

## 2022-08-09 DIAGNOSIS — O99343 Other mental disorders complicating pregnancy, third trimester: Secondary | ICD-10-CM | POA: Diagnosis not present

## 2022-08-09 DIAGNOSIS — Z882 Allergy status to sulfonamides status: Secondary | ICD-10-CM | POA: Diagnosis not present

## 2022-08-09 DIAGNOSIS — Z3A36 36 weeks gestation of pregnancy: Secondary | ICD-10-CM | POA: Diagnosis not present

## 2022-08-09 DIAGNOSIS — Z7982 Long term (current) use of aspirin: Secondary | ICD-10-CM | POA: Diagnosis not present

## 2022-08-09 DIAGNOSIS — F319 Bipolar disorder, unspecified: Secondary | ICD-10-CM | POA: Diagnosis not present

## 2022-08-09 DIAGNOSIS — A6000 Herpesviral infection of urogenital system, unspecified: Secondary | ICD-10-CM | POA: Diagnosis not present

## 2022-08-10 DIAGNOSIS — O133 Gestational [pregnancy-induced] hypertension without significant proteinuria, third trimester: Secondary | ICD-10-CM | POA: Diagnosis not present

## 2022-08-10 DIAGNOSIS — Z3A36 36 weeks gestation of pregnancy: Secondary | ICD-10-CM | POA: Diagnosis not present

## 2022-08-10 DIAGNOSIS — O3663X0 Maternal care for excessive fetal growth, third trimester, not applicable or unspecified: Secondary | ICD-10-CM | POA: Diagnosis not present

## 2022-08-14 DIAGNOSIS — Z882 Allergy status to sulfonamides status: Secondary | ICD-10-CM | POA: Diagnosis not present

## 2022-08-14 DIAGNOSIS — O9852 Other viral diseases complicating childbirth: Secondary | ICD-10-CM | POA: Diagnosis not present

## 2022-08-14 DIAGNOSIS — O99344 Other mental disorders complicating childbirth: Secondary | ICD-10-CM | POA: Diagnosis not present

## 2022-08-14 DIAGNOSIS — D62 Acute posthemorrhagic anemia: Secondary | ICD-10-CM | POA: Diagnosis not present

## 2022-08-14 DIAGNOSIS — Z23 Encounter for immunization: Secondary | ICD-10-CM | POA: Diagnosis not present

## 2022-08-14 DIAGNOSIS — O26893 Other specified pregnancy related conditions, third trimester: Secondary | ICD-10-CM | POA: Diagnosis not present

## 2022-08-14 DIAGNOSIS — O9962 Diseases of the digestive system complicating childbirth: Secondary | ICD-10-CM | POA: Diagnosis not present

## 2022-08-14 DIAGNOSIS — Z6721 Type B blood, Rh negative: Secondary | ICD-10-CM | POA: Diagnosis not present

## 2022-08-14 DIAGNOSIS — O24420 Gestational diabetes mellitus in childbirth, diet controlled: Secondary | ICD-10-CM | POA: Diagnosis not present

## 2022-08-14 DIAGNOSIS — B009 Herpesviral infection, unspecified: Secondary | ICD-10-CM | POA: Diagnosis not present

## 2022-08-14 DIAGNOSIS — F319 Bipolar disorder, unspecified: Secondary | ICD-10-CM | POA: Diagnosis not present

## 2022-08-14 DIAGNOSIS — O9081 Anemia of the puerperium: Secondary | ICD-10-CM | POA: Diagnosis not present

## 2022-08-14 DIAGNOSIS — Z3A37 37 weeks gestation of pregnancy: Secondary | ICD-10-CM | POA: Diagnosis not present

## 2022-08-14 DIAGNOSIS — Z79899 Other long term (current) drug therapy: Secondary | ICD-10-CM | POA: Diagnosis not present

## 2022-08-14 DIAGNOSIS — O134 Gestational [pregnancy-induced] hypertension without significant proteinuria, complicating childbirth: Secondary | ICD-10-CM | POA: Diagnosis not present

## 2022-08-14 DIAGNOSIS — K589 Irritable bowel syndrome without diarrhea: Secondary | ICD-10-CM | POA: Diagnosis not present

## 2022-10-08 DIAGNOSIS — S0501XA Injury of conjunctiva and corneal abrasion without foreign body, right eye, initial encounter: Secondary | ICD-10-CM | POA: Diagnosis not present

## 2022-11-13 DIAGNOSIS — O135 Gestational [pregnancy-induced] hypertension without significant proteinuria, complicating the puerperium: Secondary | ICD-10-CM | POA: Diagnosis not present
